# Patient Record
Sex: Female | Born: 1960 | Race: Black or African American | Hispanic: No | Marital: Married | Smoking: Never smoker
Health system: Southern US, Community
[De-identification: ages and names within clinical notes are randomized; demographics above are authoritative.]

## PROBLEM LIST (undated history)

## (undated) DIAGNOSIS — E042 Nontoxic multinodular goiter: Secondary | ICD-10-CM

## (undated) DIAGNOSIS — M1990 Unspecified osteoarthritis, unspecified site: Secondary | ICD-10-CM

## (undated) DIAGNOSIS — E669 Obesity, unspecified: Secondary | ICD-10-CM

## (undated) DIAGNOSIS — M1710 Unilateral primary osteoarthritis, unspecified knee: Secondary | ICD-10-CM

## (undated) DIAGNOSIS — R0683 Snoring: Secondary | ICD-10-CM

## (undated) DIAGNOSIS — G5603 Carpal tunnel syndrome, bilateral upper limbs: Secondary | ICD-10-CM

## (undated) DIAGNOSIS — G629 Polyneuropathy, unspecified: Secondary | ICD-10-CM

## (undated) DIAGNOSIS — E039 Hypothyroidism, unspecified: Secondary | ICD-10-CM

## (undated) DIAGNOSIS — R232 Flushing: Secondary | ICD-10-CM

## (undated) DIAGNOSIS — S83249A Other tear of medial meniscus, current injury, unspecified knee, initial encounter: Secondary | ICD-10-CM

## (undated) DIAGNOSIS — R809 Proteinuria, unspecified: Secondary | ICD-10-CM

## (undated) DIAGNOSIS — E119 Type 2 diabetes mellitus without complications: Secondary | ICD-10-CM

## (undated) DIAGNOSIS — M67431 Ganglion, right wrist: Secondary | ICD-10-CM

## (undated) DIAGNOSIS — I10 Essential (primary) hypertension: Secondary | ICD-10-CM

## (undated) DIAGNOSIS — E1340 Other specified diabetes mellitus with diabetic neuropathy, unspecified: Secondary | ICD-10-CM

## (undated) DIAGNOSIS — E785 Hyperlipidemia, unspecified: Secondary | ICD-10-CM

## (undated) HISTORY — DX: Proteinuria, unspecified: R80.9

## (undated) HISTORY — DX: Hyperlipidemia, unspecified: E78.5

## (undated) HISTORY — DX: Essential (primary) hypertension: I10

## (undated) HISTORY — DX: Flushing: R23.2

## (undated) HISTORY — DX: Unilateral primary osteoarthritis, unspecified knee: M17.10

## (undated) HISTORY — DX: Obesity, unspecified: E66.9

## (undated) HISTORY — DX: Polyneuropathy, unspecified: G62.9

## (undated) HISTORY — PX: BREAST EXCISIONAL BIOPSY: SUR124

## (undated) HISTORY — DX: Other tear of medial meniscus, current injury, unspecified knee, initial encounter: S83.249A

## (undated) HISTORY — PX: ABDOMINAL HYSTERECTOMY: SHX81

## (undated) HISTORY — DX: Ganglion, right wrist: M67.431

## (undated) HISTORY — DX: Snoring: R06.83

## (undated) HISTORY — DX: Carpal tunnel syndrome, bilateral upper limbs: G56.03

## (undated) HISTORY — DX: Nontoxic multinodular goiter: E04.2

---
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
## 2004-10-21 ENCOUNTER — Ambulatory Visit: Payer: Self-pay | Admitting: Family Medicine

## 2005-08-09 ENCOUNTER — Ambulatory Visit: Payer: Self-pay | Admitting: Family Medicine

## 2005-08-19 ENCOUNTER — Ambulatory Visit: Payer: Self-pay | Admitting: Family Medicine

## 2006-08-08 ENCOUNTER — Ambulatory Visit: Payer: Self-pay

## 2007-01-17 ENCOUNTER — Other Ambulatory Visit: Payer: Self-pay

## 2007-01-23 ENCOUNTER — Ambulatory Visit: Payer: Self-pay | Admitting: Unknown Physician Specialty

## 2008-06-23 ENCOUNTER — Ambulatory Visit: Payer: Self-pay | Admitting: Unknown Physician Specialty

## 2008-06-23 ENCOUNTER — Other Ambulatory Visit: Payer: Self-pay

## 2008-07-10 ENCOUNTER — Ambulatory Visit: Payer: Self-pay | Admitting: Unknown Physician Specialty

## 2009-03-03 ENCOUNTER — Ambulatory Visit: Payer: Self-pay | Admitting: Family Medicine

## 2009-05-12 LAB — HM PAP SMEAR: HM Pap smear: NORMAL

## 2009-12-02 ENCOUNTER — Emergency Department: Payer: Self-pay | Admitting: Emergency Medicine

## 2010-03-04 ENCOUNTER — Ambulatory Visit: Payer: Self-pay | Admitting: Family Medicine

## 2011-03-08 ENCOUNTER — Ambulatory Visit: Payer: Self-pay | Admitting: Family Medicine

## 2011-06-08 ENCOUNTER — Other Ambulatory Visit: Payer: Self-pay | Admitting: Family Medicine

## 2012-03-22 ENCOUNTER — Ambulatory Visit: Payer: Self-pay | Admitting: Family Medicine

## 2012-05-08 LAB — HM COLONOSCOPY: HM Colonoscopy: NORMAL

## 2013-04-30 ENCOUNTER — Ambulatory Visit: Payer: Self-pay | Admitting: Family Medicine

## 2013-10-01 HISTORY — PX: FINE NEEDLE ASPIRATION: SHX6590

## 2014-05-20 ENCOUNTER — Ambulatory Visit: Payer: Self-pay | Admitting: Family Medicine

## 2014-05-20 LAB — HM MAMMOGRAPHY: HM Mammogram: NORMAL

## 2014-08-12 LAB — LIPID PANEL
CHOLESTEROL: 144 mg/dL (ref 0–200)
HDL: 90 mg/dL — AB (ref 35–70)
LDL Cholesterol: 42 mg/dL
LDl/HDL Ratio: 0.5
TRIGLYCERIDES: 58 mg/dL (ref 40–160)

## 2015-02-12 LAB — HEMOGLOBIN A1C: Hgb A1c MFr Bld: 7.1 % — AB (ref 4.0–6.0)

## 2015-05-13 ENCOUNTER — Other Ambulatory Visit: Payer: Self-pay | Admitting: Family Medicine

## 2015-05-30 ENCOUNTER — Encounter: Payer: Self-pay | Admitting: Family Medicine

## 2015-05-30 DIAGNOSIS — E785 Hyperlipidemia, unspecified: Secondary | ICD-10-CM | POA: Insufficient documentation

## 2015-05-30 DIAGNOSIS — E890 Postprocedural hypothyroidism: Secondary | ICD-10-CM | POA: Insufficient documentation

## 2015-05-30 DIAGNOSIS — R809 Proteinuria, unspecified: Secondary | ICD-10-CM | POA: Insufficient documentation

## 2015-05-30 DIAGNOSIS — E1141 Type 2 diabetes mellitus with diabetic mononeuropathy: Secondary | ICD-10-CM | POA: Insufficient documentation

## 2015-05-30 DIAGNOSIS — G5600 Carpal tunnel syndrome, unspecified upper limb: Secondary | ICD-10-CM | POA: Insufficient documentation

## 2015-05-30 DIAGNOSIS — G629 Polyneuropathy, unspecified: Secondary | ICD-10-CM | POA: Insufficient documentation

## 2015-05-30 DIAGNOSIS — Z1211 Encounter for screening for malignant neoplasm of colon: Secondary | ICD-10-CM | POA: Insufficient documentation

## 2015-05-30 DIAGNOSIS — I10 Essential (primary) hypertension: Secondary | ICD-10-CM | POA: Insufficient documentation

## 2015-06-01 ENCOUNTER — Encounter: Payer: Self-pay | Admitting: Family Medicine

## 2015-06-01 ENCOUNTER — Ambulatory Visit (INDEPENDENT_AMBULATORY_CARE_PROVIDER_SITE_OTHER): Payer: BLUE CROSS/BLUE SHIELD | Admitting: Family Medicine

## 2015-06-01 VITALS — BP 126/66 | HR 68 | Temp 98.0°F | Resp 18 | Ht 66.5 in | Wt 234.6 lb

## 2015-06-01 DIAGNOSIS — I10 Essential (primary) hypertension: Secondary | ICD-10-CM

## 2015-06-01 DIAGNOSIS — G5600 Carpal tunnel syndrome, unspecified upper limb: Secondary | ICD-10-CM

## 2015-06-01 DIAGNOSIS — N189 Chronic kidney disease, unspecified: Secondary | ICD-10-CM | POA: Diagnosis not present

## 2015-06-01 DIAGNOSIS — R809 Proteinuria, unspecified: Secondary | ICD-10-CM | POA: Diagnosis not present

## 2015-06-01 DIAGNOSIS — E1122 Type 2 diabetes mellitus with diabetic chronic kidney disease: Secondary | ICD-10-CM | POA: Diagnosis not present

## 2015-06-01 DIAGNOSIS — E669 Obesity, unspecified: Secondary | ICD-10-CM

## 2015-06-01 DIAGNOSIS — E785 Hyperlipidemia, unspecified: Secondary | ICD-10-CM

## 2015-06-01 LAB — POCT GLYCOSYLATED HEMOGLOBIN (HGB A1C): Hemoglobin A1C: 6.2

## 2015-06-01 LAB — POCT UA - MICROALBUMIN: Microalbumin Ur, POC: 20 mg/L

## 2015-06-01 MED ORDER — IRBESARTAN-HYDROCHLOROTHIAZIDE 300-12.5 MG PO TABS: 1.0000 | ORAL_TABLET | Freq: Every day | ORAL | Status: DC

## 2015-06-01 MED ORDER — ATORVASTATIN CALCIUM 40 MG PO TABS: 40.0000 mg | ORAL_TABLET | Freq: Every day | ORAL | Status: DC

## 2015-06-01 MED ORDER — DULAGLUTIDE 1.5 MG/0.5ML ~~LOC~~ SOAJ: 1.5000 mg | SUBCUTANEOUS | Status: DC

## 2015-06-01 MED ORDER — GABAPENTIN 300 MG PO CAPS: 300.0000 mg | ORAL_CAPSULE | Freq: Every day | ORAL | Status: DC

## 2015-06-01 MED ORDER — METFORMIN HCL 1000 MG PO TABS: 1000.0000 mg | ORAL_TABLET | Freq: Every day | ORAL | Status: DC

## 2015-06-01 NOTE — Progress Notes (Signed)
Name: Samantha Copeland   MRN: [id number]    DOB: 1961/05/01   Date:06/01/2015       Progress Note  Subjective  Chief Complaint  Chief Complaint  Patient presents with  . Diabetes    1 month follow-up, weight loss 8lbs, sugar improving, but new shots has given her knots in her skin. Checks Sugar bid- Low- 83 Average 90's High-130    HPI  DMII: she started Bydureon three months ago, she has lost weight since the medication is curbing her appetite, she has also changed the way she eats.  Eating fish a couple of times weekly, smaller portions, more vegetables and has been working out with Wii games and has lost 8 lbs since last visit 2 months ago. She does not like the abdominal nodules caused by the injections.  Compliant the the medications and hgbA1C has gone down from 7.1 to 6.2 and is doing well. She has a history of albuminuria and also carpal tunnel disease but is doing well on neurontin and also ARB. Taking aspirin daily as instructed. She is due for an eye exam and will schedule it today.  HTN: taking medication daily and bp has been at goal, denies side effects of medication.   Hyperlipidemia: taking Atorvastatin and denies side effects. No chest pain, no myalgias.  Patient Active Problem List   Diagnosis Date Noted  . Benign essential HTN 05/30/2015  . Carpal tunnel syndrome 05/30/2015  . Dyslipidemia 05/30/2015  . 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 Multinodular goiter 05/30/2015  . Microalbuminuria 05/30/2015  . Obesity (BMI 30-39.9) 05/30/2015  . Diabetes mellitus with renal manifestation 05/30/2015    Past Surgical History  Procedure Laterality Date  . Abdominal hysterectomy    . Breast biopsy Right     benign  . 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 Fine needle aspiration  10/01/2013    Thyroid-Dr. Gabriel Carina    Family History  Problem Relation Age of Onset  . Diabetes Mother   . Diabetes Father    . Hypertension Father   . Hyperlipidemia Father   . Cancer Father     History   Social History  . Marital Status: Married    Spouse Name: N/A  . Number of Children: N/A  . Years of Education: N/A   Occupational History  . Not on file.   Social History Main Topics  . Smoking status: Never Smoker   . Smokeless tobacco: Not on file  . Alcohol Use: No  . Drug Use: No  . Sexual Activity:    Partners: Male   Other Topics Concern  . Not on file   Social History Narrative     Current outpatient prescriptions:  .  aspirin 81 MG tablet, , Disp: , Rfl:  .  atorvastatin (LIPITOR) 40 MG tablet, Take 1 tablet (40 mg total) by mouth daily., Disp: 30 tablet, Rfl: 5 .  gabapentin (NEURONTIN) 300 MG capsule, Take 1 capsule (300 mg total) by mouth daily., Disp: 30 capsule, Rfl: 5 .  glucose blood test strip, , Disp: , Rfl:  .  irbesartan-hydrochlorothiazide (AVALIDE) 300-12.5 MG per tablet, Take 1 tablet by mouth daily., Disp: 30 tablet, Rfl: 5 .  metFORMIN (GLUCOPHAGE) 1000 MG tablet, Take 1 tablet (1,000 mg total) by mouth daily., Disp: 30 tablet, Rfl: 5 .  Dulaglutide (TRULICITY) 1.5 GD/9.2EQ SOPN, Inject 1.5 mg into the skin once a week., Disp:  4 pen, Rfl: 5  No Known Allergies   ROS  Constitutional: Negative for fever or weight change.  Respiratory: Negative for cough and shortness of breath.   Cardiovascular: Negative for chest pain or palpitations.  Gastrointestinal: Negative for abdominal pain, no bowel changes.  Musculoskeletal: Negative for gait problem or joint swelling.  Skin: Negative for rash.  Neurological: Negative for dizziness or headache.  No other specific complaints in a complete review of systems (except as listed in HPI above).  Objective  Filed Vitals:   06/01/15 0829  BP: 126/66  Pulse: 68  Temp: 98 F (36.7 C)  TempSrc: Oral  Resp: 18  Height: 5' 6.5" (1.689 m)  Weight: 234 lb 9.6 oz (106.414 kg)  SpO2: 98%    Body mass index is 37.3  kg/(m^2).  Physical Exam  Constitutional: Patient appears well-developed and well-nourished. No distress.  HENT: Head: Normocephalic and atraumatic. Nose: Nose normal. Mouth/Throat: Oropharynx is clear and moist. No oropharyngeal exudate.  Eyes: Conjunctivae and EOM are normal. Pupils are equal, round, and reactive to light. No scleral icterus.  Neck: Normal range of motion. Neck supple. No JVD present. Thyromegaly - sees Dr. Gabriel Carina yearly  Cardiovascular: Normal rate, regular rhythm and normal heart sounds.  No murmur heard. Trace of pretibial edema Pulmonary/Chest: Effort normal and breath sounds normal. No respiratory distress. Abdominal: Soft.  There is no tenderness. no masses Musculoskeletal: Normal range of motion, no joint effusions. No gross deformities Neurological: he is alert and oriented to person, place, and time. No cranial nerve deficit. Coordination, balance, strength, speech and gait are normal.  Skin: Skin is warm and dry. No rash noted. No erythema.  Psychiatric: Patient has a normal mood and affect. behavior is normal. Judgment and thought content normal.  Recent Results (from the past 2160 hour(s))  POCT HgB A1C     Status: Abnormal   Collection Time: 06/01/15  8:37 AM  Result Value Ref Range   Hemoglobin A1C 6.2   POCT UA - Microalbumin     Status: Normal   Collection Time: 06/01/15  8:38 AM  Result Value Ref Range   Microalbumin Ur, POC 20 mg/L   Creatinine, POC  mg/dL   Albumin/Creatinine Ratio, Urine, POC      Diabetic Foot Exam: Diabetic Foot Exam - Simple   Simple Foot Form  Visual Inspection  No deformities, no ulcerations, no other skin breakdown bilaterally:  Yes  Sensation Testing  Intact to touch and monofilament testing bilaterally:  Yes  Pulse Check  Posterior Tibialis and Dorsalis pulse intact bilaterally:  Yes  Comments       PHQ2/9: Depression screen PHQ 2/9 06/01/2015  Decreased Interest 0  Down, Depressed, Hopeless 0  PHQ - 2 Score  0     Fall Risk: Fall Risk  06/01/2015  Falls in the past year? No     Assessment & Plan  1. Type 2 diabetes mellitus with diabetic chronic kidney disease Change to Trulicity to decrease side effects, doing well otherwise, continue the good work  - POCT HgB A1C - POCT UA - Microalbumin - Dulaglutide (TRULICITY) 1.5 ZO/1.0RU SOPN; Inject 1.5 mg into the skin once a week.  Dispense: 4 pen; Refill: 5 - metFORMIN (GLUCOPHAGE) 1000 MG tablet; Take 1 tablet (1,000 mg total) by mouth daily.  Dispense: 30 tablet; Refill: 5  2. Microalbuminuria Improved, down to 20, continue ARB and glucose control   3. Benign essential HTN At goal  - irbesartan-hydrochlorothiazide (AVALIDE) 300-12.5 MG per  tablet; Take 1 tablet by mouth daily.  Dispense: 30 tablet; Refill: 5 - Comprehensive Metabolic Panel (CMET)  4. Obesity (BMI 30-39.9) Losing weight with dietary modification and physical activity   5. Dyslipidemia Recheck labs - atorvastatin (LIPITOR) 40 MG tablet; Take 1 tablet (40 mg total) by mouth daily.  Dispense: 30 tablet; Refill: 5 - Lipid Profile  6. Carpal tunnel syndrome, unspecified laterality Doing well  - gabapentin (NEURONTIN) 300 MG capsule; Take 1 capsule (300 mg total) by mouth daily.  Dispense: 30 capsule; Refill: 5

## 2015-06-01 NOTE — Patient Instructions (Signed)
Dulaglutide injection What is this medicine? DULAGLUTIDE (DOO la GLOO tide) is used to improve blood sugar control in adults with type 2 diabetes. This medicine may be used with other oral diabetes medicines. This medicine may be used for other purposes; ask your health care provider or pharmacist if you have questions. COMMON BRAND NAME(S): TRULICITY What should I tell my health care provider before I take this medicine? They need to know if you have any of these conditions: -endocrine tumors (MEN 2) or if someone in your family had these tumors -history of pancreatitis -kidney disease -liver disease -stomach problems -thyroid cancer or if someone in your family had thyroid cancer -an unusual or allergic reaction to dulaglutide, other medicines, foods, dyes, or preservatives -pregnant or trying to get pregnant -breast-feeding How should I use this medicine? This medicine is for injection under the skin of your upper leg (thigh), stomach area, or upper arm. It is usually given once every week (every 7 days). You will be taught how to prepare and give this medicine. Use exactly as directed. Take your medicine at regular intervals. Do not take it more often than directed. If you use this medicine with insulin, you should inject this medicine and the insulin separately. Do not mix them together. Do not give the injections right next to each other. Change (rotate) injection sites with each injection. It is important that you put your used needles and syringes in a special sharps container. Do not put them in a trash can. If you do not have a sharps container, call your pharmacist or healthcare provider to get one. A special MedGuide will be given to you by the pharmacist with each prescription and refill. Be sure to read this information carefully each time. Talk to your pediatrician regarding the use of this medicine in children. Special care may be needed. Overdosage: If you think you've taken  too much of this medicine contact a poison control center or emergency room at once. Overdosage: If you think you have taken too much of this medicine contact a poison control center or emergency room at once. NOTE: This medicine is only for you. Do not share this medicine with others. What if I miss a dose? If you miss a dose, take it as soon as you can within 3 days after the missed dose. Then take your next dose at your regular weekly time. If it has been longer than 3 days after the missed dose, do not take the missed dose. Take the next dose at your regular time. Do not take double or extra doses. If you have questions about a missed dose, contact your health care provider for advice. What may interact with this medicine? Do not take this medicine with any of the following medications: -gatifloxacin Many medications may cause changes in blood sugar, these include: -alcohol containing beverages -aspirin and aspirin-like drugs -chloramphenicol -chromium -diuretics -female hormones, such as estrogens or progestins, birth control pills -heart medicines -isoniazid -female hormones or anabolic steroids -medications for weight loss -medicines for allergies, asthma, cold, or cough -medicines for mental problems -medicines called MAO inhibitors - Nardil, Parnate, Marplan, Eldepryl -niacin -NSAIDS, such as ibuprofen -pentamidine -phenytoin -probenecid -quinolone antibiotics such as ciprofloxacin, levofloxacin, ofloxacin -some herbal dietary supplements -steroid medicines such as prednisone or cortisone -thyroid hormonesSome medications can hide the warning symptoms of low blood sugar (hypoglycemia). You may need to monitor your blood sugar more closely if you are taking one of these medications. These include: -beta-blockers, often  used for high blood pressure or heart problems (examples include atenolol, metoprolol, propranolol) -clonidine -guanethidine -reserpine This list may not  describe all possible interactions. Give your health care provider a list of all the medicines, herbs, non-prescription drugs, or dietary supplements you use. Also tell them if you smoke, drink alcohol, or use illegal drugs. Some items may interact with your medicine. What should I watch for while using this medicine? Visit your doctor or health care professional for regular checks on your progress. A test called the HbA1C (A1C) will be monitored. This is a simple blood test. It measures your blood sugar control over the last 2 to 3 months. You will receive this test every 3 to 6 months. Learn how to check your blood sugar. Learn the symptoms of low and high blood sugar and how to manage them. Always carry a quick-source of sugar with you in case you have symptoms of low blood sugar. Examples include hard sugar candy or glucose tablets. Make sure others know that you can choke if you eat or drink when you develop serious symptoms of low blood sugar, such as seizures or unconsciousness. They must get medical help at once. Tell your doctor or health care professional if you have high blood sugar. You might need to change the dose of your medicine. If you are sick or exercising more than usual, you might need to change the dose of your medicine. Do not skip meals. Ask your doctor or health care professional if you should avoid alcohol. Many nonprescription cough and cold products contain sugar or alcohol. These can affect blood sugar. Wear a medical ID bracelet or chain, and carry a card that describes your disease and details of your medicine and dosage times. What side effects may I notice from receiving this medicine? Side effects that you should report to your doctor or health care professional as soon as possible: -allergic reactions like skin rash, itching or hives, swelling of the face, lips, or tongue -breathing problems -signs and symptoms of low blood sugar such as feeling anxious, confusion,  dizziness, increased hunger, unusually weak or tired, sweating, shakiness, cold, irritable, headache, blurred vision, fast heartbeat, loss of consciousness -unusual stomach upset or pain -vomiting Side effects that usually do not require medical attention (Report these to your doctor or health care professional if they continue or are bothersome.):diarrhea -heartburn -loss of appetite -nausea -pain, redness, or irritation at site where injected This list may not describe all possible side effects. Call your doctor for medical advice about side effects. You may report side effects to FDA at 1-800-FDA-1088. Where should I keep my medicine? Keep out of the reach of children. Store this medicine in a refrigerator between 2 and 8 degrees C (36 and 46 degrees F). Do not freeze or use if the medicine has been frozen. Protect from light and excessive heat. Each single-dose pen or prefilled syringe can be kept at room temperature, not to exceed 30 degrees C (86 degrees F) for a total of 14 days, if needed. Store in the carton until use. Throw away any unused medicine after the expiration date. NOTE: This sheet is a summary. It may not cover all possible information. If you have questions about this medicine, talk to your doctor, pharmacist, or health care provider.  2015, [employer]. (2013-10-01 13:53:28)

## 2015-06-05 ENCOUNTER — Telehealth: Payer: Self-pay

## 2015-06-05 LAB — COMPREHENSIVE METABOLIC PANEL
ALT: 16 IU/L (ref 0–32)
AST: 20 IU/L (ref 0–40)
Albumin/Globulin Ratio: 1.2 (ref 1.1–2.5)
Albumin: 4.1 g/dL (ref 3.5–5.5)
Alkaline Phosphatase: 86 IU/L (ref 39–117)
BILIRUBIN TOTAL: 0.5 mg/dL (ref 0.0–1.2)
BUN / CREAT RATIO: 10 (ref 9–23)
BUN: 11 mg/dL (ref 6–24)
CHLORIDE: 99 mmol/L (ref 97–108)
CO2: 25 mmol/L (ref 18–29)
Calcium: 9.4 mg/dL (ref 8.7–10.2)
Creatinine, Ser: 1.06 mg/dL — ABNORMAL HIGH (ref 0.57–1.00)
GFR calc non Af Amer: 60 mL/min/{1.73_m2} (ref >59)
GFR, EST AFRICAN AMERICAN: 69 mL/min/{1.73_m2} (ref >59)
Globulin, Total: 3.3 g/dL (ref 1.5–4.5)
Glucose: 77 mg/dL (ref 65–99)
POTASSIUM: 3.9 mmol/L (ref 3.5–5.2)
Sodium: 141 mmol/L (ref 134–144)
Total Protein: 7.4 g/dL (ref 6.0–8.5)

## 2015-06-05 LAB — LIPID PANEL
CHOL/HDL RATIO: 1.5 ratio (ref 0.0–4.4)
Cholesterol, Total: 139 mg/dL (ref 100–199)
HDL: 94 mg/dL (ref >39)
LDL Calculated: 35 mg/dL (ref 0–99)
Triglycerides: 52 mg/dL (ref 0–149)
VLDL CHOLESTEROL CAL: 10 mg/dL (ref 5–40)

## 2015-06-05 NOTE — Telephone Encounter (Signed)
-----   Message from Steele Sizer, MD sent at 06/05/2015  8:29 AM EDT ----- Continue high dose statin ( Atorvastatin) lipid at goal GFR stage III CKI, glucose and liver enzymes within normal limits Please notify patient, thank you

## 2015-06-05 NOTE — Telephone Encounter (Signed)
Patient notified of lab results by phone.  

## 2015-08-16 ENCOUNTER — Other Ambulatory Visit: Payer: Self-pay | Admitting: Family Medicine

## 2015-09-02 ENCOUNTER — Ambulatory Visit: Payer: BLUE CROSS/BLUE SHIELD | Admitting: Family Medicine

## 2015-09-02 ENCOUNTER — Encounter: Payer: Self-pay | Admitting: Family Medicine

## 2015-09-02 ENCOUNTER — Ambulatory Visit (INDEPENDENT_AMBULATORY_CARE_PROVIDER_SITE_OTHER): Payer: BLUE CROSS/BLUE SHIELD | Admitting: Family Medicine

## 2015-09-02 VITALS — BP 114/68 | HR 92 | Temp 97.3°F | Resp 16 | Ht 67.0 in | Wt 225.4 lb

## 2015-09-02 DIAGNOSIS — N189 Chronic kidney disease, unspecified: Secondary | ICD-10-CM | POA: Diagnosis not present

## 2015-09-02 DIAGNOSIS — M5410 Radiculopathy, site unspecified: Secondary | ICD-10-CM | POA: Diagnosis not present

## 2015-09-02 DIAGNOSIS — R809 Proteinuria, unspecified: Secondary | ICD-10-CM | POA: Diagnosis not present

## 2015-09-02 DIAGNOSIS — E1121 Type 2 diabetes mellitus with diabetic nephropathy: Secondary | ICD-10-CM

## 2015-09-02 DIAGNOSIS — E785 Hyperlipidemia, unspecified: Secondary | ICD-10-CM | POA: Diagnosis not present

## 2015-09-02 DIAGNOSIS — E1149 Type 2 diabetes mellitus with other diabetic neurological complication: Secondary | ICD-10-CM | POA: Diagnosis not present

## 2015-09-02 DIAGNOSIS — E669 Obesity, unspecified: Secondary | ICD-10-CM

## 2015-09-02 DIAGNOSIS — I10 Essential (primary) hypertension: Secondary | ICD-10-CM | POA: Diagnosis not present

## 2015-09-02 DIAGNOSIS — E1122 Type 2 diabetes mellitus with diabetic chronic kidney disease: Secondary | ICD-10-CM | POA: Diagnosis not present

## 2015-09-02 DIAGNOSIS — G5600 Carpal tunnel syndrome, unspecified upper limb: Secondary | ICD-10-CM

## 2015-09-02 DIAGNOSIS — Z23 Encounter for immunization: Secondary | ICD-10-CM

## 2015-09-02 LAB — POCT GLYCOSYLATED HEMOGLOBIN (HGB A1C): Hemoglobin A1C: 6.1

## 2015-09-02 MED ORDER — IRBESARTAN-HYDROCHLOROTHIAZIDE 300-12.5 MG PO TABS: 1.0000 | ORAL_TABLET | Freq: Every day | ORAL | Status: DC

## 2015-09-02 MED ORDER — SIMVASTATIN 20 MG PO TABS: 20.0000 mg | ORAL_TABLET | Freq: Every day | ORAL | Status: DC

## 2015-09-02 MED ORDER — GABAPENTIN 300 MG PO CAPS: 300.0000 mg | ORAL_CAPSULE | Freq: Every day | ORAL | Status: DC

## 2015-09-02 MED ORDER — METFORMIN HCL 500 MG PO TABS: 500.0000 mg | ORAL_TABLET | Freq: Every day | ORAL | Status: DC

## 2015-09-02 NOTE — Progress Notes (Signed)
Name: Samantha Copeland   MRN: [id number]    DOB: 1961-03-10   Date:09/02/2015       Progress Note  Subjective  Chief Complaint  Chief Complaint  Patient presents with  . Medication Refill    follow-up  . Diabetes    Checks BG 2x day low-80, avg -96-100 high-120  . Hypertension  . Hyperlipidemia  . Flank Pain    left side onset 3xday    HPI   DMII with nephropathy and carpal tunnel syndrome: she is doing very well on Trulicity and Metformin, hgbA1C is down to 6.1. Symptoms of carpal tunnel have improve, still taking Gabapentin before bed.  We discussed reducing dose of Metformin to avoid hypoglycemia.  Continue the weight loss and life style modification. Eating fish twice weekly, cutting down on peanuts. Explained tree nuts are healthier  HTN: bp is at goal, denies side effects of medications. Compliant with medication   Hyperlipidemia: taking Simvastatin, denies side effects of medications, labs done in June and reviewed today  Muscle pain: woke up with some spasms on left flank that lasted all day, but resolved with Bengay and massage . No urinary symptoms, no rashes. Feeling well now   Obesity: lost 10 lbs since last visit, changed diet and has been on Trulicity since June   Patient Active Problem List   Diagnosis Date Noted  . Benign essential HTN 05/30/2015  . Carpal tunnel syndrome 05/30/2015  . Dyslipidemia 05/30/2015  . 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 Multinodular goiter 05/30/2015  . Microalbuminuria 05/30/2015  . Obesity (BMI 30-39.9) 05/30/2015  . Diabetes mellitus with renal manifestation 05/30/2015    Past Surgical History  Procedure Laterality Date  . Abdominal hysterectomy    . Breast biopsy Right     benign  . 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 Fine needle aspiration  10/01/2013    Thyroid-Dr. Gabriel Carina    Family History  Problem Relation Age of Onset  . Diabetes Mother   .  Diabetes Father   . Hypertension Father   . Hyperlipidemia Father   . Cancer Father     Social History   Social History  . Marital Status: Married    Spouse Name: N/A  . Number of Children: N/A  . Years of Education: N/A   Occupational History  . Not on file.   Social History Main Topics  . Smoking status: Never Smoker   . Smokeless tobacco: Not on file  . Alcohol Use: No  . Drug Use: No  . Sexual Activity:    Partners: Male   Other Topics Concern  . Not on file   Social History Narrative     Current outpatient prescriptions:  .  aspirin 81 MG tablet, , Disp: , Rfl:  .  Dulaglutide (TRULICITY) 1.5 ZG/0.1VC SOPN, Inject 1.5 mg into the skin once a week., Disp: 4 pen, Rfl: 5 .  gabapentin (NEURONTIN) 300 MG capsule, Take 1 capsule (300 mg total) by mouth at bedtime., Disp: 90 capsule, Rfl: 1 .  glucose blood test strip, , Disp: , Rfl:  .  irbesartan-hydrochlorothiazide (AVALIDE) 300-12.5 MG per tablet, Take 1 tablet by mouth daily., Disp: 90 tablet, Rfl: 1 .  metFORMIN (GLUCOPHAGE) 500 MG tablet, Take 1 tablet (500 mg total) by mouth daily., Disp: 90 tablet, Rfl: 1 .  simvastatin (ZOCOR) 20 MG tablet, Take 1 tablet (20 mg total) by mouth daily  at 6 PM., Disp: 90 tablet, Rfl: 1  No Known Allergies   ROS  Constitutional: Negative for fever, positive for weight change - loss.  Respiratory: Negative for cough and shortness of breath.   Cardiovascular: Negative for chest pain or palpitations.  Gastrointestinal: Negative for abdominal pain, no bowel changes.  Musculoskeletal: Negative for gait problem or joint swelling.  Skin: Negative for rash.  Neurological: Negative for dizziness or headache.  No other specific complaints in a complete review of systems (except as listed in HPI above).  Objective  Filed Vitals:   09/02/15 1101  BP: 114/68  Pulse: 92  Temp: 97.3 F (36.3 C)  TempSrc: Oral  Resp: 16  Height: 5\' 7"  (1.702 m)  Weight: 225 lb 6.4 oz (102.241  kg)  SpO2: 97%    Body mass index is 35.29 kg/(m^2).  Physical Exam  Constitutional: Patient appears well-developed . Obese No distress.  HEENT: head atraumatic, normocephalic, pupils equal and reactive to light,  neck supple, throat within normal limits Cardiovascular: Normal rate, regular rhythm and normal heart sounds.  No murmur heard. No BLE edema. Pulmonary/Chest: Effort normal and breath sounds normal. No respiratory distress. Abdominal: Soft.  There is no tenderness. Psychiatric: Patient has a normal mood and affect. behavior is normal. Judgment and thought content normal.  Recent Results (from the past 2160 hour(s))  POCT HgB A1C     Status: None   Collection Time: 09/02/15 11:06 AM  Result Value Ref Range   Hemoglobin A1C 6.1      PHQ2/9: Depression screen [facility] 2/9 09/02/2015 06/01/2015  Decreased Interest 0 0  Down, Depressed, Hopeless 0 0  PHQ - 2 Score 0 0    Fall Risk: Fall Risk  09/02/2015 06/01/2015  Falls in the past year? No No      Functional Status Survey: Is the patient deaf or have difficulty hearing?: No Does the patient have difficulty seeing, even when wearing glasses/contacts?: Yes (Glasses) Does the patient have difficulty concentrating, remembering, or making decisions?: No Does the patient have difficulty walking or climbing stairs?: No Does the patient have difficulty dressing or bathing?: No Does the patient have difficulty doing errands alone such as visiting a doctor's office or shopping?: No    Assessment & Plan  1. Type 2 diabetes mellitus with diabetic nephropathy  She is doing great, we will decrease dose of Metformin, continue Trulicity and life style modification   2. Needs flu shot  - Flu Vaccine QUAD 36+ mos PF IM (Fluarix & Fluzone Quad PF)  3. Diabetic radiculopathy  - POCT HgB A1C Continue Gabapentin  4. Benign essential HTN  - irbesartan-hydrochlorothiazide (AVALIDE) 300-12.5 MG per tablet; Take 1 tablet by mouth  daily.  Dispense: 90 tablet; Refill: 1  5. Microalbuminuria  Last value was normal , doing well on ARB  6. Dyslipidemia  - simvastatin (ZOCOR) 20 MG tablet; Take 1 tablet (20 mg total) by mouth daily at 6 PM.  Dispense: 90 tablet; Refill: 1  7. Obesity (BMI 30-39.9) Doing great lost 10 lbs since last visit   8. Type 2 diabetes mellitus with diabetic chronic kidney disease  - metFORMIN (GLUCOPHAGE) 500 MG tablet; Take 1 tablet (500 mg total) by mouth daily.  Dispense: 90 tablet; Refill: 1  9. Carpal tunnel syndrome, unspecified laterality  - gabapentin (NEURONTIN) 300 MG capsule; Take 1 capsule (300 mg total) by mouth at bedtime.  Dispense: 90 capsule; Refill: 1

## 2015-09-22 ENCOUNTER — Telehealth: Payer: Self-pay | Admitting: Family Medicine

## 2015-09-22 NOTE — Telephone Encounter (Signed)
Pt notified it will kbe ok to skip one dose.

## 2015-09-22 NOTE — Telephone Encounter (Signed)
Patient messed up one of her trulicity needles and would like to know if it is okay for her to miss a dose because they only gave her enough for 1 month and she only have one left for next week.

## 2015-11-23 ENCOUNTER — Other Ambulatory Visit: Payer: Self-pay | Admitting: Family Medicine

## 2015-11-23 NOTE — Telephone Encounter (Signed)
Patient requesting refill. 

## 2015-12-02 ENCOUNTER — Inpatient Hospital Stay: Admission: RE | Admit: 2015-12-02 | Payer: Self-pay | Source: Ambulatory Visit

## 2015-12-03 ENCOUNTER — Encounter: Payer: Self-pay | Admitting: Family Medicine

## 2015-12-03 ENCOUNTER — Ambulatory Visit (INDEPENDENT_AMBULATORY_CARE_PROVIDER_SITE_OTHER): Payer: BLUE CROSS/BLUE SHIELD | Admitting: Family Medicine

## 2015-12-03 VITALS — BP 116/67 | HR 73 | Temp 97.8°F | Resp 18 | Wt 228.6 lb

## 2015-12-03 DIAGNOSIS — E785 Hyperlipidemia, unspecified: Secondary | ICD-10-CM

## 2015-12-03 DIAGNOSIS — I10 Essential (primary) hypertension: Secondary | ICD-10-CM | POA: Diagnosis not present

## 2015-12-03 DIAGNOSIS — E1121 Type 2 diabetes mellitus with diabetic nephropathy: Secondary | ICD-10-CM | POA: Diagnosis not present

## 2015-12-03 DIAGNOSIS — E042 Nontoxic multinodular goiter: Secondary | ICD-10-CM | POA: Diagnosis not present

## 2015-12-03 DIAGNOSIS — Z23 Encounter for immunization: Secondary | ICD-10-CM

## 2015-12-03 LAB — POCT UA - MICROALBUMIN: Microalbumin Ur, POC: 20 mg/L

## 2015-12-03 LAB — POCT GLYCOSYLATED HEMOGLOBIN (HGB A1C): Hemoglobin A1C: 5.9

## 2015-12-03 MED ORDER — DULAGLUTIDE 1.5 MG/0.5ML ~~LOC~~ SOAJ: SUBCUTANEOUS | Status: DC

## 2015-12-03 NOTE — Progress Notes (Signed)
Name: Samantha Copeland   MRN: [id number]    DOB: 05/30/1961   Date:12/03/2015       Progress Note  Subjective  Chief Complaint  Chief Complaint  Patient presents with  . Medication Refill    follow-up  . Diabetes  . Hypertension  . 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 Symptoms of carpal tunnel have improved and stable with gabapentin.  We will stop Metformin now.  Continue life style modification. Eating fish twice weekly. Explained tree nuts are healthier . Urine micro is 20 today and she is on ARB  HTN: bp is at goal, denies side effects of medications. Compliant with medication . No chest pain or SOB or palpitation   Hyperlipidemia: taking Simvastatin, denies side effects of medications, labs done in June and reviewed today. Discussed ways to increase HDL   Obesity: changed diet and has been on Trulicity since June, she has gained a few lbs since last visit, but states after Christmas she will resume exercising, working long hours.  Goiter: seeing Dr. Richardson Landry and is scheduled for total thyroidectomy next week  Patient Active Problem List   Diagnosis Date Noted  . Benign essential HTN 05/30/2015  . Carpal tunnel syndrome 05/30/2015  . Dyslipidemia 05/30/2015  . 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 Multinodular goiter 05/30/2015  . Microalbuminuria 05/30/2015  . Obesity (BMI 30-39.9) 05/30/2015  . Diabetes mellitus with renal manifestation ([place]) 05/30/2015    Past Surgical History  Procedure Laterality Date  . Abdominal hysterectomy    . Breast biopsy Right     benign  . 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 Fine needle aspiration  10/01/2013    Thyroid-Dr. Gabriel Carina    Family History  Problem Relation Age of Onset  . Diabetes Mother   . Diabetes  Father   . Hypertension Father   . Hyperlipidemia Father   . Cancer Father     Social History   Social History  . Marital Status: Married    Spouse Name: N/A  . Number of Children: N/A  . Years of Education: N/A   Occupational History  . Not on file.   Social History Main Topics  . Smoking status: Never Smoker   . Smokeless tobacco: Not on file  . Alcohol Use: No  . Drug Use: No  . Sexual Activity:    Partners: Male   Other Topics Concern  . Not on file   Social History Narrative     Current outpatient prescriptions:  .  aspirin 81 MG tablet, , Disp: , Rfl:  .  Dulaglutide (TRULICITY) 1.5 0000000 SOPN, INJECT 1.5MG  SUBCUTANEOUSLY ONCE A WEEK, Disp: 4 pen, Rfl: 2 .  gabapentin (NEURONTIN) 300 MG capsule, Take 1 capsule (300 mg total) by mouth at bedtime., Disp: 90 capsule, Rfl: 1 .  glucose blood test strip, , Disp: , Rfl:  .  irbesartan-hydrochlorothiazide (AVALIDE) 300-12.5 MG per tablet, Take 1 tablet by mouth daily., Disp: 90 tablet, Rfl: 1 .  simvastatin (ZOCOR) 20 MG tablet, Take 1 tablet (20 mg total) by mouth daily at 6 PM., Disp: 90 tablet, Rfl: 1  No Known Allergies   ROS  Constitutional: Negative for fever or significant  weight change.  Respiratory: Negative for cough and shortness of breath.  Cardiovascular: Negative for chest pain or palpitations.  Gastrointestinal: Negative for abdominal pain, no bowel changes.  Musculoskeletal: Negative for gait problem or joint swelling.  Skin: Negative for rash.  Neurological: Negative for dizziness or headache.  No other specific complaints in a complete review of systems (except as listed in HPI above).  Objective  Filed Vitals:   12/03/15 0813  BP: 116/67  Pulse: 73  Temp: 97.8 F (36.6 C)  TempSrc: Oral  Resp: 18  Weight: 228 lb 9.6 oz (103.692 kg)  SpO2: 96%    Body mass index is 35.8 kg/(m^2).  Physical Exam  Constitutional: Patient appears well-developed and well-nourished. Obese  No  distress.  HEENT: head atraumatic, normocephalic, pupils equal and reactive to light, neck supple, large goiter , throat within normal limits Cardiovascular: Normal rate, regular rhythm and normal heart sounds.  No murmur heard. No BLE edema. Pulmonary/Chest: Effort normal and breath sounds normal. No respiratory distress. Abdominal: Soft.  There is no tenderness. Psychiatric: Patient has a normal mood and affect. behavior is normal. Judgment and thought content normal.  Recent Results (from the past 2160 hour(s))  POCT HgB A1C     Status: Normal   Collection Time: 12/03/15  8:15 AM  Result Value Ref Range   Hemoglobin A1C 5.9   POCT UA - Microalbumin     Status: Abnormal   Collection Time: 12/03/15  8:16 AM  Result Value Ref Range   Microalbumin Ur, POC 20 mg/L   Creatinine, POC  mg/dL   Albumin/Creatinine Ratio, Urine, POC     PHQ2/9: Depression screen [facility] 2/9 12/03/2015 09/02/2015 06/01/2015  Decreased Interest 0 0 0  Down, Depressed, Hopeless 0 0 0  PHQ - 2 Score 0 0 0    Fall Risk: Fall Risk  12/03/2015 09/02/2015 06/01/2015  Falls in the past year? No No No    Functional Status Survey: Is the patient deaf or have difficulty hearing?: No Does the patient have difficulty seeing, even when wearing glasses/contacts?: Yes (glasses) Does the patient have difficulty concentrating, remembering, or making decisions?: No Does the patient have difficulty walking or climbing stairs?: No Does the patient have difficulty dressing or bathing?: No Does the patient have difficulty doing errands alone such as visiting a doctor's office or shopping?: No    Assessment & Plan  1. Diabetic nephropathy associated with type 2 diabetes mellitus ([place])  Stop Metformin, and continue to monitor glucose -  Currently 80's-90's - POCT HgB A1C - POCT UA - Microalbumin - Dulaglutide (TRULICITY) 1.5 0000000 SOPN; INJECT 1.5MG  SUBCUTANEOUSLY ONCE A WEEK  Dispense: 4 pen; Refill: 2  2.  Dyslipidemia  Continue medication   3. Benign essential HTN  Doing well on medication, denies orthostatic changes  4. Multinodular goiter  Going for surgery next week  5. Need for pneumococcal vaccination  - Pneumococcal conjugate vaccine 13-valent IM

## 2015-12-04 ENCOUNTER — Telehealth: Payer: Self-pay | Admitting: Family Medicine

## 2015-12-04 ENCOUNTER — Encounter
Admission: RE | Admit: 2015-12-04 | Discharge: 2015-12-04 | Disposition: A | Discharge status: Home / Self Care | Payer: BLUE CROSS/BLUE SHIELD | Source: Ambulatory Visit | Attending: Otolaryngology | Admitting: Otolaryngology

## 2015-12-04 DIAGNOSIS — Z01812 Encounter for preprocedural laboratory examination: Secondary | ICD-10-CM | POA: Diagnosis present

## 2015-12-04 DIAGNOSIS — Z01810 Encounter for preprocedural cardiovascular examination: Secondary | ICD-10-CM | POA: Diagnosis present

## 2015-12-04 HISTORY — DX: Type 2 diabetes mellitus without complications: E11.9

## 2015-12-04 HISTORY — DX: Unspecified osteoarthritis, unspecified site: M19.90

## 2015-12-04 HISTORY — DX: Other specified diabetes mellitus with diabetic neuropathy, unspecified: E13.40

## 2015-12-04 LAB — BASIC METABOLIC PANEL
ANION GAP: 7 (ref 5–15)
BUN: 16 mg/dL (ref 6–20)
CALCIUM: 9.2 mg/dL (ref 8.9–10.3)
CHLORIDE: 101 mmol/L (ref 101–111)
CO2: 28 mmol/L (ref 22–32)
CREATININE: 1.01 mg/dL — AB (ref 0.44–1.00)
GFR calc non Af Amer: >60 mL/min (ref >60)
GLUCOSE: 84 mg/dL (ref 65–99)
Potassium: 3.2 mmol/L — ABNORMAL LOW (ref 3.5–5.1)
Sodium: 136 mmol/L (ref 135–145)

## 2015-12-04 NOTE — Pre-Procedure Instructions (Signed)
Cardiac clearance received by Dr. Clayborn Bigness, and patient is cleared for surgery at low risk

## 2015-12-04 NOTE — Patient Instructions (Signed)
  Your procedure is scheduled on: December 09, 2015 (Wednesday) Report to Day Surgery.[facility]) Second Floor To find out your arrival time please call [phone] between 1PM - 3PM on December 08, 2015 (Tuesday).  Remember: Instructions that are not followed completely may result in serious medical risk, up to and including death, or upon the discretion of your surgeon and anesthesiologist your surgery may need to be rescheduled.    __x__ 1. Do not eat food or drink liquids after midnight. No gum chewing or hard candies.     ____ 2. No Alcohol for 24 hours before or after surgery.   ____ 3. Bring all medications with you on the day of surgery if instructed.    __x__ 4. Notify your doctor if there is any change in your medical condition     (cold, fever, infections).     Do not wear jewelry, make-up, hairpins, clips or nail polish.  Do not wear lotions, powders, or perfumes. You may wear deodorant.  Do not shave 48 hours prior to surgery. Men may shave face and neck.  Do not bring valuables to the hospital.    [facility] is not responsible for any belongings or valuables.               Contacts, dentures or bridgework may not be worn into surgery.  Leave your suitcase in the car. After surgery it may be brought to your room.  For patients admitted to the hospital, discharge time is determined by your                treatment team.   Patients discharged the day of surgery will not be allowed to drive home.   Please read over the following fact sheets that you were given:   Surgical Site Infection Prevention   ____ Take these medicines the morning of surgery with A SIP OF WATER:    1.   2.   3.   4.  5.  6.  ____ Fleet Enema (as directed)   _x___ Use CHG Soap as directed  ____ Use inhalers on the day of surgery  _x___ Stop metformin 2 days prior to surgery (STOP METFORMIN ON December 26)   ____ Take 1/2 of usual insulin dose the night before surgery and none on  the morning of surgery.   _x___ Stop Coumadin/Plavix/aspirin on (Patient has stopped Aspirin)  ____ Stop Anti-inflammatories on (Stop Ibuprofen NOW) Tylenol ok to take for pain if needed   ____ Stop supplements until after surgery.    ____ Bring C-Pap to the hospital.

## 2015-12-04 NOTE — Telephone Encounter (Signed)
Patient was seen by Dr. Richardson Landry and was told that her potassium was low. He said he would send over a prescription for you to approve so she can pick it up today. Patient will be having surgery 12/09/15 and is needing something for her potassium. Is there something she can take over the counter if not please send prescription to [facility]

## 2015-12-04 NOTE — Pre-Procedure Instructions (Signed)
Called Dr Marcello Moores regarding abnormal EKG.  Cardiac clearance requested has no cardiologist.  Called and faxed Dr Richardson Landry requesting a cardiac clearance.

## 2015-12-04 NOTE — Pre-Procedure Instructions (Signed)
Low K+ results called and faxed to [facility], @ Dr. Richardson Landry office.

## 2015-12-06 ENCOUNTER — Other Ambulatory Visit: Payer: Self-pay | Admitting: Family Medicine

## 2015-12-06 MED ORDER — POTASSIUM CHLORIDE CRYS ER 20 MEQ PO TBCR: 20.0000 meq | EXTENDED_RELEASE_TABLET | Freq: Every day | ORAL | Status: DC

## 2015-12-06 NOTE — Telephone Encounter (Signed)
Sent to [facility], but pharmacy may be closed

## 2015-12-09 ENCOUNTER — Encounter: Payer: Self-pay | Admitting: *Deleted

## 2015-12-09 ENCOUNTER — Ambulatory Visit: Payer: BLUE CROSS/BLUE SHIELD | Admitting: Certified Registered Nurse Anesthetist

## 2015-12-09 ENCOUNTER — Observation Stay
Admission: RE | Admit: 2015-12-09 | Discharge: 2015-12-10 | Disposition: A | Discharge status: Home / Self Care | Payer: BLUE CROSS/BLUE SHIELD | Source: Ambulatory Visit | Attending: Otolaryngology | Admitting: Otolaryngology

## 2015-12-09 ENCOUNTER — Encounter
Admission: RE | Disposition: A | Discharge status: Home / Self Care | Payer: Self-pay | Source: Ambulatory Visit | Attending: Otolaryngology

## 2015-12-09 DIAGNOSIS — Z79899 Other long term (current) drug therapy: Secondary | ICD-10-CM | POA: Diagnosis not present

## 2015-12-09 DIAGNOSIS — E669 Obesity, unspecified: Secondary | ICD-10-CM | POA: Insufficient documentation

## 2015-12-09 DIAGNOSIS — I10 Essential (primary) hypertension: Secondary | ICD-10-CM | POA: Diagnosis not present

## 2015-12-09 DIAGNOSIS — M1990 Unspecified osteoarthritis, unspecified site: Secondary | ICD-10-CM | POA: Diagnosis not present

## 2015-12-09 DIAGNOSIS — Z7982 Long term (current) use of aspirin: Secondary | ICD-10-CM | POA: Insufficient documentation

## 2015-12-09 DIAGNOSIS — E1140 Type 2 diabetes mellitus with diabetic neuropathy, unspecified: Secondary | ICD-10-CM | POA: Insufficient documentation

## 2015-12-09 DIAGNOSIS — E785 Hyperlipidemia, unspecified: Secondary | ICD-10-CM | POA: Insufficient documentation

## 2015-12-09 DIAGNOSIS — E049 Nontoxic goiter, unspecified: Secondary | ICD-10-CM | POA: Insufficient documentation

## 2015-12-09 DIAGNOSIS — E042 Nontoxic multinodular goiter: Secondary | ICD-10-CM | POA: Diagnosis present

## 2015-12-09 DIAGNOSIS — Z6837 Body mass index (BMI) 37.0-37.9, adult: Secondary | ICD-10-CM | POA: Insufficient documentation

## 2015-12-09 HISTORY — PX: THYROIDECTOMY: SHX17

## 2015-12-09 LAB — GLUCOSE, CAPILLARY
GLUCOSE-CAPILLARY: 111 mg/dL — AB (ref 65–99)
GLUCOSE-CAPILLARY: 129 mg/dL — AB (ref 65–99)
Glucose-Capillary: 85 mg/dL (ref 65–99)
Glucose-Capillary: 134 mg/dL — ABNORMAL HIGH (ref 65–99)
Glucose-Capillary: 104 mg/dL — ABNORMAL HIGH (ref 65–99)

## 2015-12-09 LAB — CREATININE, SERUM
Creatinine, Ser: 1.04 mg/dL — ABNORMAL HIGH (ref 0.44–1.00)
GFR calc non Af Amer: 60 mL/min — ABNORMAL LOW (ref >60)

## 2015-12-09 LAB — CALCIUM: Calcium: 8.7 mg/dL — ABNORMAL LOW (ref 8.9–10.3)

## 2015-12-09 LAB — POCT I-STAT 4, (NA,K, GLUC, HGB,HCT)
GLUCOSE: 93 mg/dL (ref 65–99)
HCT: 44 % (ref 36.0–46.0)
Hemoglobin: 15 g/dL (ref 12.0–15.0)
POTASSIUM: 3.5 mmol/L (ref 3.5–5.1)
SODIUM: 141 mmol/L (ref 135–145)

## 2015-12-09 LAB — CBC
HCT: 40.6 % (ref 35.0–47.0)
Hemoglobin: 13.4 g/dL (ref 12.0–16.0)
MCH: 27.8 pg (ref 26.0–34.0)
MCHC: 33 g/dL (ref 32.0–36.0)
MCV: 84.4 fL (ref 80.0–100.0)
PLATELETS: 182 10*3/uL (ref 150–440)
RBC: 4.81 MIL/uL (ref 3.80–5.20)
RDW: 15 % — AB (ref 11.5–14.5)
WBC: 10.5 10*3/uL (ref 3.6–11.0)

## 2015-12-09 LAB — MAGNESIUM: Magnesium: 1.7 mg/dL (ref 1.7–2.4)

## 2015-12-09 LAB — POTASSIUM: Potassium: 3.7 mmol/L (ref 3.5–5.1)

## 2015-12-09 SURGERY — THYROIDECTOMY
Anesthesia: General | Site: Throat | Wound class: Clean Contaminated

## 2015-12-09 MED ORDER — ONDANSETRON HCL 4 MG PO TABS: 4.0000 mg | ORAL_TABLET | ORAL | Status: DC | PRN

## 2015-12-09 MED ORDER — LIDOCAINE-EPINEPHRINE (PF) 1 %-1:200000 IJ SOLN
INTRAMUSCULAR | Status: AC
  Filled 2015-12-09: qty 30

## 2015-12-09 MED ORDER — PROPOFOL 10 MG/ML IV BOLUS
INTRAVENOUS | Status: DC | PRN
  Administered 2015-12-09: 150 mg via INTRAVENOUS

## 2015-12-09 MED ORDER — HYDROCODONE-ACETAMINOPHEN 5-325 MG PO TABS
1.0000 | ORAL_TABLET | ORAL | Status: DC | PRN
  Administered 2015-12-09: 1 via ORAL
  Filled 2015-12-09: qty 1

## 2015-12-09 MED ORDER — GABAPENTIN 300 MG PO CAPS
300.0000 mg | ORAL_CAPSULE | Freq: Every day | ORAL | Status: DC
  Administered 2015-12-09: 300 mg via ORAL
  Filled 2015-12-09: qty 1

## 2015-12-09 MED ORDER — PHENYLEPHRINE HCL 10 MG/ML IJ SOLN
10.0000 mg | INTRAVENOUS | Status: DC | PRN
  Administered 2015-12-09: 50 ug/min via INTRAVENOUS

## 2015-12-09 MED ORDER — FENTANYL CITRATE (PF) 100 MCG/2ML IJ SOLN
INTRAMUSCULAR | Status: DC | PRN
  Administered 2015-12-09: 50 ug via INTRAVENOUS
  Administered 2015-12-09: 100 ug via INTRAVENOUS

## 2015-12-09 MED ORDER — LIDOCAINE HCL (CARDIAC) 20 MG/ML IV SOLN
INTRAVENOUS | Status: DC | PRN
  Administered 2015-12-09: 80 mg via INTRAVENOUS

## 2015-12-09 MED ORDER — POTASSIUM CHLORIDE IN NACL 20-0.9 MEQ/L-% IV SOLN
INTRAVENOUS | Status: DC
  Administered 2015-12-09 (×2): via INTRAVENOUS
  Filled 2015-12-09 (×4): qty 1000

## 2015-12-09 MED ORDER — INSULIN ASPART 100 UNIT/ML ~~LOC~~ SOLN
0.0000 [IU] | SUBCUTANEOUS | Status: DC
  Administered 2015-12-09 (×2): 2 [IU] via SUBCUTANEOUS
  Administered 2015-12-10: 3 [IU] via SUBCUTANEOUS
  Filled 2015-12-09 (×2): qty 2
  Filled 2015-12-09: qty 3

## 2015-12-09 MED ORDER — ACETAMINOPHEN 10 MG/ML IV SOLN
INTRAVENOUS | Status: AC
  Filled 2015-12-09: qty 100

## 2015-12-09 MED ORDER — IRBESARTAN 150 MG PO TABS
300.0000 mg | ORAL_TABLET | Freq: Every day | ORAL | Status: DC
Start: 2015-12-09 — End: 2015-12-10
  Administered 2015-12-09: 300 mg via ORAL
  Filled 2015-12-09: qty 1
  Filled 2015-12-09: qty 2

## 2015-12-09 MED ORDER — ATORVASTATIN CALCIUM 20 MG PO TABS
40.0000 mg | ORAL_TABLET | Freq: Every day | ORAL | Status: DC
  Administered 2015-12-09: 40 mg via ORAL
  Filled 2015-12-09: qty 2

## 2015-12-09 MED ORDER — FENTANYL CITRATE (PF) 100 MCG/2ML IJ SOLN
25.0000 ug | INTRAMUSCULAR | Status: DC | PRN
  Administered 2015-12-09 (×4): 25 ug via INTRAVENOUS

## 2015-12-09 MED ORDER — BACITRACIN 500 UNIT/GM EX OINT
TOPICAL_OINTMENT | CUTANEOUS | Status: DC | PRN
  Administered 2015-12-09: 1 via TOPICAL

## 2015-12-09 MED ORDER — ACETAMINOPHEN 10 MG/ML IV SOLN
INTRAVENOUS | Status: DC | PRN
  Administered 2015-12-09: 1000 mg via INTRAVENOUS

## 2015-12-09 MED ORDER — PHENYLEPHRINE HCL 10 MG/ML IJ SOLN
INTRAMUSCULAR | Status: DC | PRN
Start: 2015-12-09 — End: 2015-12-09
  Administered 2015-12-09: 100 ug via INTRAVENOUS

## 2015-12-09 MED ORDER — MIDAZOLAM HCL 2 MG/2ML IJ SOLN
INTRAMUSCULAR | Status: DC | PRN
  Administered 2015-12-09: 1 mg via INTRAVENOUS

## 2015-12-09 MED ORDER — ONDANSETRON HCL 4 MG/2ML IJ SOLN
INTRAMUSCULAR | Status: DC | PRN
  Administered 2015-12-09: 4 mg via INTRAVENOUS

## 2015-12-09 MED ORDER — SIMVASTATIN 20 MG PO TABS: 20.0000 mg | ORAL_TABLET | Freq: Every day | ORAL | Status: DC

## 2015-12-09 MED ORDER — FAMOTIDINE 20 MG PO TABS
20.0000 mg | ORAL_TABLET | Freq: Once | ORAL | Status: AC
  Administered 2015-12-09: 20 mg via ORAL

## 2015-12-09 MED ORDER — BACITRACIN ZINC 500 UNIT/GM EX OINT
TOPICAL_OINTMENT | CUTANEOUS | Status: AC
  Filled 2015-12-09: qty 28.35

## 2015-12-09 MED ORDER — LIDOCAINE-EPINEPHRINE (PF) 1 %-1:200000 IJ SOLN
INTRAMUSCULAR | Status: DC | PRN
  Administered 2015-12-09: 6 mL

## 2015-12-09 MED ORDER — METFORMIN HCL 500 MG PO TABS
500.0000 mg | ORAL_TABLET | Freq: Every day | ORAL | Status: DC
  Administered 2015-12-09: 500 mg via ORAL
  Filled 2015-12-09: qty 1

## 2015-12-09 MED ORDER — BACITRACIN ZINC 500 UNIT/GM EX OINT
1.0000 "application " | TOPICAL_OINTMENT | Freq: Three times a day (TID) | CUTANEOUS | Status: DC
  Administered 2015-12-09 – 2015-12-10 (×3): 1 via TOPICAL
  Filled 2015-12-09 (×2): qty 0.9

## 2015-12-09 MED ORDER — PROMETHAZINE HCL 25 MG/ML IJ SOLN: 6.2500 mg | INTRAMUSCULAR | Status: DC | PRN

## 2015-12-09 MED ORDER — HYDROCHLOROTHIAZIDE 12.5 MG PO CAPS
12.5000 mg | ORAL_CAPSULE | Freq: Every day | ORAL | Status: DC
  Administered 2015-12-09 – 2015-12-10 (×2): 12.5 mg via ORAL
  Filled 2015-12-09 (×2): qty 1

## 2015-12-09 MED ORDER — DEXAMETHASONE SODIUM PHOSPHATE 10 MG/ML IJ SOLN
INTRAMUSCULAR | Status: DC | PRN
  Administered 2015-12-09: 4 mg via INTRAVENOUS

## 2015-12-09 MED ORDER — CALCIUM CARBONATE-VITAMIN D 500-200 MG-UNIT PO TABS
2.0000 | ORAL_TABLET | Freq: Two times a day (BID) | ORAL | Status: DC
  Administered 2015-12-09 – 2015-12-10 (×3): 2 via ORAL
  Filled 2015-12-09 (×3): qty 2

## 2015-12-09 MED ORDER — FENTANYL CITRATE (PF) 100 MCG/2ML IJ SOLN
INTRAMUSCULAR | Status: AC
  Filled 2015-12-09: qty 2

## 2015-12-09 MED ORDER — EPHEDRINE SULFATE 50 MG/ML IJ SOLN
INTRAMUSCULAR | Status: DC | PRN
  Administered 2015-12-09: 5 mg via INTRAVENOUS
  Administered 2015-12-09 (×2): 10 mg via INTRAVENOUS

## 2015-12-09 MED ORDER — MORPHINE SULFATE (PF) 2 MG/ML IV SOLN: 2.0000 mg | INTRAVENOUS | Status: DC | PRN

## 2015-12-09 MED ORDER — HEPARIN SODIUM (PORCINE) 5000 UNIT/ML IJ SOLN
5000.0000 [IU] | Freq: Three times a day (TID) | INTRAMUSCULAR | Status: DC
  Administered 2015-12-09 – 2015-12-10 (×3): 5000 [IU] via SUBCUTANEOUS
  Filled 2015-12-09 (×3): qty 1

## 2015-12-09 MED ORDER — SODIUM CHLORIDE 0.9 % IV SOLN
INTRAVENOUS | Status: DC
  Administered 2015-12-09 (×2): via INTRAVENOUS

## 2015-12-09 MED ORDER — DOCUSATE SODIUM 100 MG PO CAPS
100.0000 mg | ORAL_CAPSULE | Freq: Two times a day (BID) | ORAL | Status: DC
  Administered 2015-12-09 – 2015-12-10 (×3): 100 mg via ORAL
  Filled 2015-12-09 (×3): qty 1

## 2015-12-09 MED ORDER — SUCCINYLCHOLINE CHLORIDE 20 MG/ML IJ SOLN
INTRAMUSCULAR | Status: DC | PRN
  Administered 2015-12-09: 100 mg via INTRAVENOUS

## 2015-12-09 MED ORDER — FAMOTIDINE 20 MG PO TABS
ORAL_TABLET | ORAL | Status: AC
  Administered 2015-12-09: 20 mg via ORAL
  Filled 2015-12-09: qty 1

## 2015-12-09 MED ORDER — ONDANSETRON HCL 4 MG/2ML IJ SOLN: 4.0000 mg | INTRAMUSCULAR | Status: DC | PRN

## 2015-12-09 MED ORDER — REMIFENTANIL HCL 1 MG IV SOLR
INTRAVENOUS | Status: DC | PRN
  Administered 2015-12-09: 09:00:00 via INTRAVENOUS
  Administered 2015-12-09: .1 ug/kg/min via INTRAVENOUS

## 2015-12-09 SURGICAL SUPPLY — 35 items
BLADE SURG 15 STRL LF DISP TIS (BLADE) ×1 IMPLANT
BLADE SURG 15 STRL SS (BLADE) ×2
BULB RESERV EVAC DRAIN JP 100C (MISCELLANEOUS) ×6 IMPLANT
CANISTER SUCT 1200ML W/VALVE (MISCELLANEOUS) ×3 IMPLANT
CORD BIP STRL DISP 12FT (MISCELLANEOUS) ×6 IMPLANT
DRAIN CHANNEL JP 15F RND 16 (MISCELLANEOUS) ×6 IMPLANT
DRAIN TLS ROUND 10FR (DRAIN) IMPLANT
DRAPE MAG INST 16X20 L/F (DRAPES) ×3 IMPLANT
DRSG TEGADERM 2-3/8X2-3/4 SM (GAUZE/BANDAGES/DRESSINGS) ×3 IMPLANT
ELECT LARYNGEAL 6/7 (MISCELLANEOUS)
ELECT LARYNGEAL 8/9 (MISCELLANEOUS) ×3
ELECTRODE LARYNGEAL 6/7 (MISCELLANEOUS) IMPLANT
ELECTRODE LARYNGEAL 8/9 (MISCELLANEOUS) ×1 IMPLANT
FORCEPS JEWEL BIP 4-3/4 STR (INSTRUMENTS) ×3 IMPLANT
GLOVE BIO SURGEON STRL SZ7.5 (GLOVE) ×24 IMPLANT
GOWN STRL REUS W/ TWL LRG LVL3 (GOWN DISPOSABLE) ×3 IMPLANT
GOWN STRL REUS W/TWL LRG LVL3 (GOWN DISPOSABLE) ×6
HARMONIC SCALPEL FOCUS (MISCELLANEOUS) ×3 IMPLANT
HEMOSTAT SURGICEL 2X3 (HEMOSTASIS) ×3 IMPLANT
HOOK STAY BLUNT/RETRACTOR 5M (MISCELLANEOUS) ×3 IMPLANT
KIT RM TURNOVER STRD PROC AR (KITS) ×3 IMPLANT
LABEL OR SOLS (LABEL) ×3 IMPLANT
NS IRRIG 500ML POUR BTL (IV SOLUTION) ×3 IMPLANT
PACK HEAD/NECK (MISCELLANEOUS) ×3 IMPLANT
PAD GROUND ADULT SPLIT (MISCELLANEOUS) ×3 IMPLANT
PROBE NEUROSIGN BIPOL (MISCELLANEOUS) ×1 IMPLANT
PROBE NEUROSIGN BIPOLAR (MISCELLANEOUS) ×2
SPONGE KITTNER 5P (MISCELLANEOUS) ×15 IMPLANT
SPONGE XRAY 4X4 16PLY STRL (MISCELLANEOUS) ×9 IMPLANT
SUT PROLENE 3 0 PS 2 (SUTURE) ×3 IMPLANT
SUT SILK 2 0 (SUTURE)
SUT SILK 2 0 SH (SUTURE) ×3 IMPLANT
SUT SILK 2-0 18XBRD TIE 12 (SUTURE) IMPLANT
SUT VIC AB 4-0 RB1 18 (SUTURE) ×3 IMPLANT
SYSTEM CHEST DRAIN TLS 7FR (DRAIN) IMPLANT

## 2015-12-09 NOTE — Op Note (Signed)
12/09/2015  5:18 PM    Swara D Julien Nordmann  [id number]   Pre-Op Diagnosis:  MULTINODULAR GOITER  Post-op Diagnosis: MULTINODULAR GOITER  Procedure:   Total thyroidectomy Surgeon:  [name] First Assistant: None   Anesthesia:  General endotracheal  EBL:  123XX123 cc  Complications:  None  Findings: The right lobe of the thyroid gland was significantly enlarged with extension below the level of the clavicle and multiple nodules. The left lobe of the gland contained multiple nodules and was enlarged but did not have significant extension below the level of the clavicle.  Procedure: After the patient was identified in holding and the procedure was reviewed.  The patient was taken to the operating room and with the patient in a comfortable supine position,  general endotracheal anesthesia was induced without difficulty.  A nerve monitor on the endotracheal tube was visualized to be between the cords at the time of intubation. A proper time-out was performed, confirming the operative site and procedure.  The patient was placed on a shoulder roll and position. Skin was injected with 1% lidocaine with epinephrine 1-200,000. The patient was then prepped and draped in the usual sterile fashion. A 15 blade was used to incise the skin carrying the incision down through the subcutaneous tissues. The platysma muscle was divided with the Bovie. The strap muscles were identified in the midline and divided in the midline, retracting them laterally. Small anterior jugular veins were divided with the Harmonic scalpel. The strap muscles were retracted laterally off of the capsule of the right lobe of the thyroid gland. This was then finger dissected to loosen loose attachments to the gland. The strap muscles were somewhat adherent to the surface of the gland, and as needed were divided from the capsule of the gland with the Harmonic scalpel. Dissection proceeded superiorly, dissecting the superior pole of  the gland. The superior pole vessels were divided with the Harmonic scalpel. A small parathyroid gland was visualized and preserved during this dissection. The superior pole was retracted inferiorly and dissection proceeded around the lateral aspect of the gland, dividing vascular attachments with the Harmonic scalpel. The inferior pole was quite large extending below the level of the clavicle. This was dissected out as low as possible, however it was difficult to get to the bottom of the gland initially. There was actually a separate large inferior lobule that could be separated from the more superior aspect of the gland, and the decision was made to dissecting out the superior aspect of the gland separate from the inferior lobe. The superior lobe of the gland was then retracted medially and delivered from the wound. Dissection along the trachea revealed the recurrent laryngeal nerve which stimulated properly. The superior lobe of the gland was then dissected off of the trachea, dividing Berry's ligament with the nerve carefully protected. The superior lobe of the right gland was then divided at the isthmus and a small attachment to the inferior lobe divided, resecting the superior lobe completely which was set aside and sent as a separate specimen. Next attention was turned to the remaining inferior lobe which was carefully dissected out, using a Babcock to grasp the gland and pull it from the chest. Careful dissection around the gland under direct visualization was performed, dividing attachments with the Harmonic scalpel. I was able to palpate the innominate artery and avoid injury to it, which was just deep to the inferior lobe. Inferior pole vessels were identified and divided with the Harmonic scalpel as  they were encountered. A small structure consistent with the parathyroid gland was noted inferiorly as well, and preserved. The remainder of the right lobe of the thyroid gland was thus delivered and sent as  a separate specimen.  Next the left lobe of the thyroid gland was dissected in the same fashion as described above, however on this side the gland was smaller though still enlarged, and could be removed as a single left lobe specimen. Once again the superior and inferior pole vessels were divided right at the capsule of the gland and structures consistent with parathyroid tissue was superiorly. The inferior parathyroid on this side was not visualized, however I did not see any obvious parathyroid tissue on the specimen as it was dissected. Retracting the gland medially the recurrent laryngeal nerve was identified and confirmed with the stimulator. This was then carefully protected as the gland was dissected away from the trachea and Berry's ligament divided. The left lobe was delivered and sent as a specimen.   The wound was then irrigated and inspected for bleeding. # 7 JP drains were placed on either side of the trachea with the drains coming out through the skin just below the wound. The drains were secured with 4-0 silk suture. Surgicel was placed on either side of the trachea to control minor oozing within the wound, as well as the inferior aspect of the wound on the right where there was some minor oozing. The strap muscles were then reapproximated with 4-0 Vicryl suture. The platysma and subcutaneous tissues were also closed with 4-0 Vicryl suture. The skin was closed with a 3-0 running subcuticular Prolene suture. The patient was then returned to the anesthesiologist for awakening. The patient was awakened and taken to recovery room in good condition postoperatively.  Disposition:   PACU then transferred to floor  Plan: The patient is to be admitted for observation and monitoring of serum calcium, with potential discharge tomorrow if serum calcium is stable overnight and showing adequate progress of recovery.  [name] 12/09/2015 5:18 PM

## 2015-12-09 NOTE — Transfer of Care (Signed)
Immediate Anesthesia Transfer of Care Note  Patient: Samantha Copeland  Procedure(s) Performed: Procedure(s): THYROIDECTOMY (N/A)  Patient Location: PACU  Anesthesia Type:General  Level of Consciousness: awake  Airway & Oxygen Therapy: Patient Spontanous Breathing and Patient connected to nasal cannula oxygen  Post-op Assessment: Report given to RN and Post -op Vital signs reviewed and stable  Post vital signs: Reviewed and stable  Last Vitals:  Filed Vitals:   12/09/15 0606  BP: 135/83  Pulse: 72  Temp: 36.3 C  Resp: 14    Complications: No apparent anesthesia complications

## 2015-12-09 NOTE — Anesthesia Preprocedure Evaluation (Addendum)
Anesthesia Evaluation  Patient identified by MRN, date of birth, ID band Patient awake    Reviewed: Allergy & Precautions, H&P , NPO status , Patient's Chart, lab work & pertinent test results, reviewed documented beta blocker date and time   History of Anesthesia Complications Negative for: history of anesthetic complications  Airway Mallampati: I  TM Distance: >3 FB Neck ROM: full    Dental no notable dental hx. (+) Missing,    Pulmonary neg pulmonary ROS,    Pulmonary exam normal breath sounds clear to auscultation       Cardiovascular Exercise Tolerance: Good hypertension, (-) angina(-) CAD, (-) Past MI, (-) Cardiac Stents and (-) CABG Normal cardiovascular exam+ dysrhythmias (Sinus bradycardia, negative initial work-up, but echo and stress test scheduled for after surgery.) (-) Valvular Problems/Murmurs Rhythm:regular Rate:Normal     Neuro/Psych neg Seizures  Neuromuscular disease (peripheral neuropathy) negative psych ROS   GI/Hepatic negative GI ROS, Neg liver ROS,   Endo/Other  diabetes, Poorly Controlled, Oral Hypoglycemic AgentsThyroid nodule, no symptoms of tracheal compression.  Renal/GU CRFRenal disease  negative genitourinary   Musculoskeletal   Abdominal   Peds  Hematology negative hematology ROS (+)   Anesthesia Other Findings Past Medical History:   Hypertension                                                 Hot flashes                                                  Bilateral carpal tunnel syndrome                             Neuropathy (HCC)                                             Hyperlipidemia                                               Obesity                                                      Snoring                                                      Microalbuminuria                                             Ganglion of right wrist  Multinodular goiter                                          Diabetes mellitus without complication (HCC)                 Arthritis                                                    Neuropathy due to secondary diabetes mellitus *              Reproductive/Obstetrics negative OB ROS                           Anesthesia Physical Anesthesia Plan  ASA: III  Anesthesia Plan: General   Post-op Pain Management:    Induction: Intravenous  Airway Management Planned: Oral ETT  Additional Equipment:   Intra-op Plan:   Post-operative Plan: Extubation in OR  Informed Consent: I have reviewed the patients History and Physical, chart, labs and discussed the procedure including the risks, benefits and alternatives for the proposed anesthesia with the patient or authorized representative who has indicated his/her understanding and acceptance.   Dental Advisory Given  Plan Discussed with: Anesthesiologist, CRNA and Surgeon  Anesthesia Plan Comments:        Anesthesia Quick Evaluation

## 2015-12-09 NOTE — H&P (Signed)
History and physical reviewed and will be scanned in later. No change in medical status reported by the patient or family, appears stable for surgery. All questions regarding the procedure answered, and patient (or family if a child) expressed understanding of the procedure.  Victoria Euceda S @TODAY@ 

## 2015-12-09 NOTE — Anesthesia Procedure Notes (Signed)
Procedure Name: Intubation Date/Time: 12/09/2015 7:39 AM Performed by: Rockne Coons Pre-anesthesia Checklist: Patient identified, Emergency Drugs available, Patient being monitored, Timeout performed and Suction available Patient Re-evaluated:Patient Re-evaluated prior to inductionOxygen Delivery Method: Circle system utilized Preoxygenation: Pre-oxygenation with 100% oxygen Intubation Type: IV induction Ventilation: Mask ventilation without difficulty Laryngoscope Size: Mac and 3 Grade View: Grade II Tube type: Oral Tube size: 7.0 mm Number of attempts: 1 Airway Equipment and Method: Stylet Placement Confirmation: ETT inserted through vocal cords under direct vision,  positive ETCO2 and breath sounds checked- equal and bilateral Secured at: 24 cm Tube secured with: Tape Dental Injury: Teeth and Oropharynx as per pre-operative assessment

## 2015-12-09 NOTE — Telephone Encounter (Signed)
Left voice message to inform patient that prescription has been sent to her pharmacy

## 2015-12-09 NOTE — Progress Notes (Signed)
Samantha Copeland, Samantha Copeland [id number] [birth year] [name], MD   SUBJECTIVE: This 54 y.o. year old female is status post THYROIDECTOMY. She has some soreness but has been able to swallow liquids and pills. Initially the nurses were having some difficulty with the drains holding but now they are holding suction properly.  Medications:  Current Facility-Administered Medications  Medication Dose Route Frequency Provider Last Rate Last Dose  . 0.9 % NaCl with KCl 20 mEq/ L  infusion   Intravenous Continuous Clyde Canterbury, MD 100 mL/hr at 12/09/15 1311    . atorvastatin (LIPITOR) tablet 40 mg  40 mg Oral q1800 Clyde Canterbury, MD      . bacitracin ointment 1 application  1 application Topical 3 times per day Clyde Canterbury, MD   1 application at 25/00/37 1503  . calcium-vitamin D (OSCAL WITH D) 500-200 MG-UNIT per tablet 2 tablet  2 tablet Oral BID Clyde Canterbury, MD   2 tablet at 12/09/15 1344  . docusate sodium (COLACE) capsule 100 mg  100 mg Oral BID Clyde Canterbury, MD   100 mg at 12/09/15 1345  . fentaNYL (SUBLIMAZE) 100 MCG/2ML injection           . gabapentin (NEURONTIN) capsule 300 mg  300 mg Oral QHS Clyde Canterbury, MD      . heparin injection 5,000 Units  5,000 Units Subcutaneous 3 times per day Clyde Canterbury, MD   5,000 Units at 12/09/15 1503  . hydrochlorothiazide (MICROZIDE) capsule 12.5 mg  12.5 mg Oral Daily Clyde Canterbury, MD   12.5 mg at 12/09/15 1344  . HYDROcodone-acetaminophen (NORCO/VICODIN) 5-325 MG per tablet 1-2 tablet  1-2 tablet Oral Q4H PRN Clyde Canterbury, MD      . insulin aspart (novoLOG) injection 0-24 Units  0-24 Units Subcutaneous 6 times per day Clyde Canterbury, MD   2 Units at 12/09/15 1708  . irbesartan (AVAPRO) tablet 300 mg  300 mg Oral Daily Clyde Canterbury, MD   300 mg at 12/09/15 1345  . metFORMIN (GLUCOPHAGE) tablet 500 mg  500 mg Oral QHS Clyde Canterbury, MD      . morphine 2 MG/ML injection 2-4 mg  2-4 mg Intravenous Q2H PRN Clyde Canterbury, MD      . ondansetron [facility]) tablet 4 mg  4  mg Oral Q4H PRN Clyde Canterbury, MD       Or  . ondansetron [facility]) injection 4 mg  4 mg Intravenous Q4H PRN Clyde Canterbury, MD      .  Medications Prior to Admission  Medication Sig Dispense Refill  . atorvastatin (LIPITOR) 40 MG tablet Take 40 mg by mouth daily at 6 PM.    . calcium carbonate (TUMS - DOSED IN MG ELEMENTAL CALCIUM) 500 MG chewable tablet Chew 2 tablets by mouth daily.    . cholecalciferol (VITAMIN D) 1000 UNITS tablet Take 1,000 Units by mouth daily.    Marland Kitchen gabapentin (NEURONTIN) 300 MG capsule Take 1 capsule (300 mg total) by mouth at bedtime. 90 capsule 1  . glucose blood test strip     . irbesartan-hydrochlorothiazide (AVALIDE) 300-12.5 MG per tablet Take 1 tablet by mouth daily. 90 tablet 1  . potassium chloride SA (K-DUR,KLOR-CON) 20 MEQ tablet Take 1 tablet (20 mEq total) by mouth daily. 30 tablet 3  . simvastatin (ZOCOR) 20 MG tablet Take 1 tablet (20 mg total) by mouth daily at 6 PM. 90 tablet 1  . aspirin 81 MG tablet Take 81 mg by mouth daily.     . Dulaglutide (  TRULICITY) 1.5 JI/9.6VE SOPN INJECT 1.5MG SUBCUTANEOUSLY ONCE A WEEK (Patient taking differently: Inject into the skin. INJECT 1.5MG SUBCUTANEOUSLY ONCE A WEEK (SUNDAY)) 4 pen 2  . metFORMIN (GLUCOPHAGE) 500 MG tablet Take 500 mg by mouth at bedtime.      OBJECTIVE:  PHYSICAL EXAM  Vitals: Blood pressure 103/63, pulse 64, temperature 97.5 F (36.4 C), temperature source Oral, resp. rate 16, height _0  (1.676 m), weight 103.42 kg (228 lb), SpO2 99 %.. General: Well-developed, Well-nourished in no acute distress Mood: Mood and affect well adjusted, pleasant and cooperative. Orientation: Grossly alert and oriented. Vocal Quality: No hoarseness. Communicates verbally. head and Face: NCAT. No facial asymmetry. No visible skin lesions. No significant facial scars. No tenderness with sinus percussion. Facial strength normal and symmetric. Neck: Supple and symmetric with no palpable masses, tenderness or  crepitance. The trachea is midline. The neck wound shows no signs of hematoma. Sutures are in place and intact. Drains are in place and appear to be holding suction well.  Respiratory: Normal respiratory effort without labored breathing. No stridor  MEDICAL DECISION MAKING: Data Review:  Results for orders placed or performed during the hospital encounter of 12/09/15 (from the past 48 hour(s))  Glucose, capillary     Status: None   Collection Time: 12/09/15  6:03 AM  Result Value Ref Range   Glucose-Capillary 85 65 - 99 mg/dL   Comment 1 Notify RN   I-STAT 4, (NA,K, GLUC, HGB,HCT)     Status: None   Collection Time: 12/09/15  6:28 AM  Result Value Ref Range   Sodium 141 135 - 145 mmol/L   Potassium 3.5 3.5 - 5.1 mmol/L   Glucose, Bld 93 65 - 99 mg/dL   HCT 44.0 36.0 - 46.0 %   Hemoglobin 15.0 12.0 - 15.0 g/dL  Glucose, capillary     Status: Abnormal   Collection Time: 12/09/15 10:59 AM  Result Value Ref Range   Glucose-Capillary 111 (H) 65 - 99 mg/dL  Glucose, capillary     Status: Abnormal   Collection Time: 12/09/15  1:17 PM  Result Value Ref Range   Glucose-Capillary 134 (H) 65 - 99 mg/dL  Potassium     Status: None   Collection Time: 12/09/15  4:35 PM  Result Value Ref Range   Potassium 3.7 3.5 - 5.1 mmol/L  CBC     Status: Abnormal   Collection Time: 12/09/15  4:35 PM  Result Value Ref Range   WBC 10.5 3.6 - 11.0 K/uL   RBC 4.81 3.80 - 5.20 MIL/uL   Hemoglobin 13.4 12.0 - 16.0 g/dL   HCT 40.6 35.0 - 47.0 %   MCV 84.4 80.0 - 100.0 fL   MCH 27.8 26.0 - 34.0 pg   MCHC 33.0 32.0 - 36.0 g/dL   RDW 15.0 (H) 11.5 - 14.5 %   Platelets 182 150 - 440 K/uL  Creatinine, serum     Status: Abnormal   Collection Time: 12/09/15  4:35 PM  Result Value Ref Range   Creatinine, Ser 1.04 (H) 0.44 - 1.00 mg/dL   GFR calc non Af Amer 60 (L) >60 mL/min   GFR calc Af Amer >60 >60 mL/min    Comment: (NOTE) The eGFR has been calculated using the CKD EPI equation. This calculation has not  been validated in all clinical situations. eGFR's persistently <60 mL/min signify possible Chronic Kidney Disease.   Calcium     Status: Abnormal   Collection Time: 12/09/15  4:35 PM  Result Value Ref Range   Calcium 8.7 (L) 8.9 - 10.3 mg/dL  Magnesium     Status: None   Collection Time: 12/09/15  4:35 PM  Result Value Ref Range   Magnesium 1.7 1.7 - 2.4 mg/dL  Glucose, capillary     Status: Abnormal   Collection Time: 12/09/15  4:36 PM  Result Value Ref Range   Glucose-Capillary 129 (H) 65 - 99 mg/dL  . No results found..   ASSESSMENT: Doing well after total thyroidectomy. Her voice sounds strong with no evidence of hoarseness. Minimal drop in the calcium.   PLAN: We can observe the calcium for now, as she is already getting supplemental calcium. We'll recheck serum calcium in the morning. Continue drains.

## 2015-12-10 DIAGNOSIS — E042 Nontoxic multinodular goiter: Secondary | ICD-10-CM | POA: Diagnosis not present

## 2015-12-10 LAB — GLUCOSE, CAPILLARY
GLUCOSE-CAPILLARY: 134 mg/dL — AB (ref 65–99)
Glucose-Capillary: 68 mg/dL (ref 65–99)

## 2015-12-10 LAB — SURGICAL PATHOLOGY

## 2015-12-10 LAB — CALCIUM: CALCIUM: 8.6 mg/dL — AB (ref 8.9–10.3)

## 2015-12-10 MED ORDER — CALCIUM CARBONATE-VITAMIN D 500-200 MG-UNIT PO TABS: 1.0000 | ORAL_TABLET | Freq: Three times a day (TID) | ORAL | Status: DC

## 2015-12-10 MED ORDER — DOCUSATE SODIUM 100 MG PO CAPS: 100.0000 mg | ORAL_CAPSULE | Freq: Two times a day (BID) | ORAL | Status: DC

## 2015-12-10 MED ORDER — LEVOTHYROXINE SODIUM 125 MCG PO TABS: 125.0000 ug | ORAL_TABLET | Freq: Every day | ORAL | Status: DC

## 2015-12-10 MED ORDER — HYDROCODONE-ACETAMINOPHEN 5-325 MG PO TABS: 1.0000 | ORAL_TABLET | ORAL | Status: DC | PRN

## 2015-12-10 NOTE — Anesthesia Postprocedure Evaluation (Signed)
Anesthesia Post Note  Patient: Samantha Copeland  Procedure(s) Performed: Procedure(s) (LRB): THYROIDECTOMY (N/A)  Patient location during evaluation: PACU Anesthesia Type: General Level of consciousness: awake and alert Pain management: pain level controlled Vital Signs Assessment: post-procedure vital signs reviewed and stable Respiratory status: spontaneous breathing, nonlabored ventilation, respiratory function stable and patient connected to nasal cannula oxygen Cardiovascular status: blood pressure returned to baseline and stable Postop Assessment: no signs of nausea or vomiting Anesthetic complications: no    Last Vitals:  Filed Vitals:   12/10/15 0500 12/10/15 1007  BP: 126/64 131/63  Pulse: 50 64  Temp: 36.5 C   Resp: 16     Last Pain:  Filed Vitals:   12/10/15 1007  PainSc: Asleep                 Martha Clan

## 2015-12-10 NOTE — Progress Notes (Signed)
IV and tele were removed. Discharge instructions, follow-up appointments, and prescriptions were provided to the pt. The pt was taken downstairs via wheelchair.

## 2015-12-10 NOTE — Progress Notes (Signed)
Patient ID: Samantha Copeland, female   DOB: [birth year], 54 y.o.   MRN: [id number] Samantha, Copeland [id number] [birth year] Samantha Nearing, MD   SUBJECTIVE: This 54 y.o. year old female is status post THYROIDECTOMY. She is doing well this AM and motivated to go home. Was able to swallow liquids and pills last night and feels like she can advance her diet.  Medications:  Current Facility-Administered Medications  Medication Dose Route Frequency Provider Last Rate Last Dose  . 0.9 % NaCl with KCl 20 mEq/ L  infusion   Intravenous Continuous Clyde Canterbury, MD 100 mL/hr at 12/09/15 2234    . atorvastatin (LIPITOR) tablet 40 mg  40 mg Oral q1800 Clyde Canterbury, MD   40 mg at 12/09/15 1749  . bacitracin ointment 1 application  1 application Topical 3 times per day Clyde Canterbury, MD   1 application at 54/62/70 0540  . calcium-vitamin D (OSCAL WITH D) 500-200 MG-UNIT per tablet 2 tablet  2 tablet Oral BID Clyde Canterbury, MD   2 tablet at 12/09/15 2311  . docusate sodium (COLACE) capsule 100 mg  100 mg Oral BID Clyde Canterbury, MD   100 mg at 12/09/15 2233  . gabapentin (NEURONTIN) capsule 300 mg  300 mg Oral QHS Clyde Canterbury, MD   300 mg at 12/09/15 2233  . heparin injection 5,000 Units  5,000 Units Subcutaneous 3 times per day Clyde Canterbury, MD   5,000 Units at 12/10/15 [phone]  . hydrochlorothiazide (MICROZIDE) capsule 12.5 mg  12.5 mg Oral Daily Clyde Canterbury, MD   12.5 mg at 12/09/15 1344  . HYDROcodone-acetaminophen (NORCO/VICODIN) 5-325 MG per tablet 1-2 tablet  1-2 tablet Oral Q4H PRN Clyde Canterbury, MD   1 tablet at 12/09/15 2233  . insulin aspart (novoLOG) injection 0-24 Units  0-24 Units Subcutaneous 6 times per day Clyde Canterbury, MD   3 Units at 12/10/15 0539  . irbesartan (AVAPRO) tablet 300 mg  300 mg Oral Daily Clyde Canterbury, MD   300 mg at 12/09/15 1345  . metFORMIN (GLUCOPHAGE) tablet 500 mg  500 mg Oral QHS Clyde Canterbury, MD   500 mg at 12/09/15 2233  . morphine 2 MG/ML injection 2-4 mg  2-4 mg  Intravenous Q2H PRN Clyde Canterbury, MD      . ondansetron [facility]) tablet 4 mg  4 mg Oral Q4H PRN Clyde Canterbury, MD       Or  . ondansetron [facility]) injection 4 mg  4 mg Intravenous Q4H PRN Clyde Canterbury, MD      .  Medications Prior to Admission  Medication Sig Dispense Refill  . atorvastatin (LIPITOR) 40 MG tablet Take 40 mg by mouth daily at 6 PM.    . calcium carbonate (TUMS - DOSED IN MG ELEMENTAL CALCIUM) 500 MG chewable tablet Chew 2 tablets by mouth daily.    . cholecalciferol (VITAMIN D) 1000 UNITS tablet Take 1,000 Units by mouth daily.    Marland Kitchen gabapentin (NEURONTIN) 300 MG capsule Take 1 capsule (300 mg total) by mouth at bedtime. 90 capsule 1  . glucose blood test strip     . irbesartan-hydrochlorothiazide (AVALIDE) 300-12.5 MG per tablet Take 1 tablet by mouth daily. 90 tablet 1  . potassium chloride SA (K-DUR,KLOR-CON) 20 MEQ tablet Take 1 tablet (20 mEq total) by mouth daily. 30 tablet 3  . simvastatin (ZOCOR) 20 MG tablet Take 1 tablet (20 mg total) by mouth daily at 6 PM. 90 tablet 1  . aspirin 81 MG tablet Take 81 mg  by mouth daily.     . Dulaglutide (TRULICITY) 1.5 ST/4.1DQ SOPN INJECT 1.'5MG'$  SUBCUTANEOUSLY ONCE A WEEK (Patient taking differently: Inject into the skin. INJECT 1.'5MG'$  SUBCUTANEOUSLY ONCE A WEEK (SUNDAY)) 4 pen 2  . metFORMIN (GLUCOPHAGE) 500 MG tablet Take 500 mg by mouth at bedtime.      OBJECTIVE:  PHYSICAL EXAM  Vitals: Blood pressure 126/64, pulse 50, temperature 97.7 F (36.5 C), temperature source Oral, resp. rate 16, height '5\' 6"'$  (1.676 m), weight 106.595 kg (235 lb), SpO2 100 %.. General: Well-developed, Well-nourished in no acute distress Mood: Mood and affect well adjusted, pleasant and cooperative. Orientation: Grossly alert and oriented. Vocal Quality: No hoarseness. Communicates verbally. head and Face: NCAT. No facial asymmetry. No visible skin lesions. No significant facial scars. No tenderness with sinus percussion. Facial strength normal and  symmetric. Neck: Supple and symmetric with no palpable masses, tenderness or crepitance. The trachea is midline. Neck wound is free of hematoma, clean and dry, and the sutures are intact. The right JP drain was removed. There was minimal serous output on that side this morning, and serous output is noted in the left drain which had more output over the past 24 hours.  Respiratory: Normal respiratory effort without labored breathing.  MEDICAL DECISION MAKING: Data Review:  Results for orders placed or performed during the hospital encounter of 12/09/15 (from the past 48 hour(s))  Glucose, capillary     Status: None   Collection Time: 12/09/15  6:03 AM  Result Value Ref Range   Glucose-Capillary 85 65 - 99 mg/dL   Comment 1 Notify RN   I-STAT 4, (NA,K, GLUC, HGB,HCT)     Status: None   Collection Time: 12/09/15  6:28 AM  Result Value Ref Range   Sodium 141 135 - 145 mmol/L   Potassium 3.5 3.5 - 5.1 mmol/L   Glucose, Bld 93 65 - 99 mg/dL   HCT 44.0 36.0 - 46.0 %   Hemoglobin 15.0 12.0 - 15.0 g/dL  Glucose, capillary     Status: Abnormal   Collection Time: 12/09/15 10:59 AM  Result Value Ref Range   Glucose-Capillary 111 (H) 65 - 99 mg/dL  Glucose, capillary     Status: Abnormal   Collection Time: 12/09/15  1:17 PM  Result Value Ref Range   Glucose-Capillary 134 (H) 65 - 99 mg/dL  Potassium     Status: None   Collection Time: 12/09/15  4:35 PM  Result Value Ref Range   Potassium 3.7 3.5 - 5.1 mmol/L  CBC     Status: Abnormal   Collection Time: 12/09/15  4:35 PM  Result Value Ref Range   WBC 10.5 3.6 - 11.0 K/uL   RBC 4.81 3.80 - 5.20 MIL/uL   Hemoglobin 13.4 12.0 - 16.0 g/dL   HCT 40.6 35.0 - 47.0 %   MCV 84.4 80.0 - 100.0 fL   MCH 27.8 26.0 - 34.0 pg   MCHC 33.0 32.0 - 36.0 g/dL   RDW 15.0 (H) 11.5 - 14.5 %   Platelets 182 150 - 440 K/uL  Creatinine, serum     Status: Abnormal   Collection Time: 12/09/15  4:35 PM  Result Value Ref Range   Creatinine, Ser 1.04 (H) 0.44 -  1.00 mg/dL   GFR calc non Af Amer 60 (L) >60 mL/min   GFR calc Af Amer >60 >60 mL/min    Comment: (NOTE) The eGFR has been calculated using the CKD EPI equation. This calculation has not been validated in  all clinical situations. eGFR's persistently <60 mL/min signify possible Chronic Kidney Disease.   Calcium     Status: Abnormal   Collection Time: 12/09/15  4:35 PM  Result Value Ref Range   Calcium 8.7 (L) 8.9 - 10.3 mg/dL  Magnesium     Status: None   Collection Time: 12/09/15  4:35 PM  Result Value Ref Range   Magnesium 1.7 1.7 - 2.4 mg/dL  Glucose, capillary     Status: Abnormal   Collection Time: 12/09/15  4:36 PM  Result Value Ref Range   Glucose-Capillary 129 (H) 65 - 99 mg/dL  Glucose, capillary     Status: Abnormal   Collection Time: 12/09/15 10:28 PM  Result Value Ref Range   Glucose-Capillary 104 (H) 65 - 99 mg/dL   Comment 1 Notify RN   Glucose, capillary     Status: Abnormal   Collection Time: 12/10/15  5:02 AM  Result Value Ref Range   Glucose-Capillary 134 (H) 65 - 99 mg/dL   Comment 1 Notify RN   Calcium     Status: Abnormal   Collection Time: 12/10/15  5:32 AM  Result Value Ref Range   Calcium 8.6 (L) 8.9 - 10.3 mg/dL  . No results found..   ASSESSMENT: Doing well after total thyroidectomy.   PLAN: There was a minimal drop in calcium, but nothing concerning. I will have her increase her calcium tablets to 3 times daily to compensate. She will be started on Synthroid and discharged home with Lortab for pain control. She made advance her diet as tolerated. Wound care was discussed. I will see her back in the morning for drain removal.   Samantha Nearing, MD 12/10/2015 8:20 AM

## 2016-02-19 ENCOUNTER — Other Ambulatory Visit: Payer: Self-pay | Admitting: Family Medicine

## 2016-02-19 DIAGNOSIS — E1121 Type 2 diabetes mellitus with diabetic nephropathy: Secondary | ICD-10-CM

## 2016-02-19 NOTE — Telephone Encounter (Signed)
Gave patient 1 sample out of our refrigerator, please send in a refill of patient medication. Thanks

## 2016-02-19 NOTE — Telephone Encounter (Signed)
Completely out of Trulicity, please send to [facility] [address]. Patient is suppose to take the shots on Sundays

## 2016-02-20 MED ORDER — DULAGLUTIDE 1.5 MG/0.5ML ~~LOC~~ SOAJ: 1.5000 mg | SUBCUTANEOUS | Status: DC

## 2016-03-03 ENCOUNTER — Ambulatory Visit (INDEPENDENT_AMBULATORY_CARE_PROVIDER_SITE_OTHER): Payer: BLUE CROSS/BLUE SHIELD | Admitting: Family Medicine

## 2016-03-03 ENCOUNTER — Encounter: Payer: Self-pay | Admitting: Family Medicine

## 2016-03-03 VITALS — BP 114/70 | HR 80 | Temp 97.7°F | Resp 14 | Ht 66.0 in | Wt 230.6 lb

## 2016-03-03 DIAGNOSIS — E669 Obesity, unspecified: Secondary | ICD-10-CM | POA: Diagnosis not present

## 2016-03-03 DIAGNOSIS — E785 Hyperlipidemia, unspecified: Secondary | ICD-10-CM

## 2016-03-03 DIAGNOSIS — E0789 Other specified disorders of thyroid: Secondary | ICD-10-CM

## 2016-03-03 DIAGNOSIS — G5600 Carpal tunnel syndrome, unspecified upper limb: Secondary | ICD-10-CM

## 2016-03-03 DIAGNOSIS — E890 Postprocedural hypothyroidism: Secondary | ICD-10-CM

## 2016-03-03 DIAGNOSIS — R809 Proteinuria, unspecified: Secondary | ICD-10-CM

## 2016-03-03 DIAGNOSIS — I10 Essential (primary) hypertension: Secondary | ICD-10-CM

## 2016-03-03 DIAGNOSIS — E1129 Type 2 diabetes mellitus with other diabetic kidney complication: Secondary | ICD-10-CM | POA: Diagnosis not present

## 2016-03-03 LAB — POCT GLYCOSYLATED HEMOGLOBIN (HGB A1C): HEMOGLOBIN A1C: 6.1

## 2016-03-03 MED ORDER — GABAPENTIN 300 MG PO CAPS: 300.0000 mg | ORAL_CAPSULE | Freq: Every day | ORAL | Status: DC

## 2016-03-03 MED ORDER — DULAGLUTIDE 1.5 MG/0.5ML ~~LOC~~ SOAJ: 1.5000 mg | SUBCUTANEOUS | Status: DC

## 2016-03-03 MED ORDER — IRBESARTAN-HYDROCHLOROTHIAZIDE 300-12.5 MG PO TABS: 1.0000 | ORAL_TABLET | Freq: Every day | ORAL | Status: DC

## 2016-03-03 MED ORDER — ATORVASTATIN CALCIUM 40 MG PO TABS: 40.0000 mg | ORAL_TABLET | Freq: Every day | ORAL | Status: DC

## 2016-03-03 NOTE — Progress Notes (Signed)
Name: Samantha Copeland   MRN: [id number]    DOB: [birth year]   Date:03/03/2016       Progress Note  Subjective  Chief Complaint  Chief Complaint  Patient presents with  . Medication Refill  . Diabetes    checks BG 2x low-68, high-160  . Hypertension  . Hyperlipidemia  . 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 She denies hypoglycemic episodes. Symptoms of carpal tunnel is a little worse and has been using two pills daily of gabapentin, but no longer has feet numbness of pain.  Continue life style modification. Eating fish twice weekly. Explained tree nuts are healthier . Urine micro is 20 in December and she is on ARB  HTN: bp is at goal, denies side effects of medications. Compliant with medication . No chest pain or SOB or palpitation, or dizziness.   Hyperlipidemia: currently on Lipitor 40 mg. Discussed ways to increase HDL   Obesity: changed diet and has been on Trulicity since June, she has gained a couple  lbs since last visit, but she is eating healthy but still not exercising, she will try to resume it soon.   Goiter: seeing Dr. Richardson Landry had total thyroidectomy in December, doing well, on Synthroid 100 mcg daily and has follow up with Dr. Gabriel Carina once more before she returns to me for management   Patient Active Problem List   Diagnosis Date Noted  . Benign essential HTN 05/30/2015  . Carpal tunnel syndrome 05/30/2015  . Dyslipidemia 05/30/2015  . 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 History of thyroidectomy, total 05/30/2015  . Microalbuminuria 05/30/2015  . Obesity (BMI 30-39.9) 05/30/2015  . Diabetes mellitus with renal manifestation ([place]) 05/30/2015    Past Surgical History  Procedure Laterality Date  . Abdominal  hysterectomy    . Breast biopsy Right     benign  . 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 Fine needle aspiration  10/01/2013    Thyroid-Dr. Gabriel Carina  . Thyroidectomy N/A 12/09/2015    Procedure: THYROIDECTOMY;  Surgeon: Clyde Canterbury, MD;  Location: [facility] ORS;  Service: ENT;  Laterality: N/A;    Family History  Problem Relation Age of Onset  . Diabetes Mother   . Diabetes Father   . Hypertension Father   . Hyperlipidemia Father   . Cancer Father   . Diabetes Sister     Social History   Social History  . Marital Status: Married    Spouse Name: N/A  . Number of Children: N/A  . Years of Education: N/A   Occupational History  . Not on file.   Social History Main Topics  . Smoking status: Never Smoker   . Smokeless tobacco: Never Used  . Alcohol Use: No  . Drug Use: No  . Sexual Activity:    Partners: Male   Other Topics Concern  . Not on file   Social History Narrative     Current outpatient prescriptions:  .  aspirin EC 81 MG tablet, Take by mouth., Disp: , Rfl:  .  atorvastatin (LIPITOR) 40 MG tablet, Take 40 mg by mouth daily at 6 PM., Disp: , Rfl:  .  calcium-vitamin D (OSCAL WITH  D) 500-200 MG-UNIT tablet, Take 1 tablet by mouth 3 (three) times daily after meals., Disp: 90 tablet, Rfl: 1 .  Cholecalciferol (VITAMIN D-1000 MAX ST) 1000 units tablet, Take by mouth., Disp: , Rfl:  .  docusate sodium (COLACE) 100 MG capsule, Take 1 capsule (100 mg total) by mouth 2 (two) times daily., Disp: 10 capsule, Rfl: 0 .  Dulaglutide (TRULICITY) 1.5 IZ/1.2WP SOPN, Inject 1.5 mg into the skin once a week. INJECT 1.5MG SUBCUTANEOUSLY ONCE A WEEK (SUNDAY), Disp: 4 pen, Rfl: 0 .  gabapentin (NEURONTIN) 300 MG capsule, Take 1 capsule (300 mg total) by mouth at bedtime., Disp: 90 capsule, Rfl: 1 .  glucose blood test strip, , Disp: , Rfl:  .  irbesartan-hydrochlorothiazide (AVALIDE) 300-12.5 MG per tablet, Take 1 tablet by mouth daily., Disp: 90  tablet, Rfl: 1 .  levothyroxine (SYNTHROID, LEVOTHROID) 100 MCG tablet, , Disp: , Rfl:  .  metFORMIN (GLUCOPHAGE) 500 MG tablet, Take 500 mg by mouth at bedtime., Disp: , Rfl:   No Known Allergies   ROS  Constitutional: Negative for fever or significant weight change.  Respiratory: Negative for cough and shortness of breath.   Cardiovascular: Negative for chest pain or palpitations.  Gastrointestinal: Negative for abdominal pain, no bowel changes.  Musculoskeletal: Negative for gait problem or joint swelling.  Skin: Negative for rash.  Neurological: Negative for dizziness or headache.  No other specific complaints in a complete review of systems (except as listed in HPI above).  Objective  Filed Vitals:   03/03/16 0809  BP: 114/70  Pulse: 80  Temp: 97.7 F (36.5 C)  TempSrc: Oral  Resp: 14  Height: _0  (1.676 m)  Weight: 230 lb 9.6 oz (104.599 kg)  SpO2: 95%    Body mass index is 37.24 kg/(m^2).  Physical Exam  Constitutional: Patient appears well-developed and well-nourished. Obese  No distress.  HEENT: head atraumatic, normocephalic, pupils equal and reactive to light,  neck supple, surgical site in good aspect, throat within normal limits Cardiovascular: Normal rate, regular rhythm and normal heart sounds.  No murmur heard. No BLE edema. Pulmonary/Chest: [place] normal and breath sounds normal. No respiratory distress. Abdominal: Soft.  There is no tenderness. Psychiatric: Patient has a normal mood and affect. behavior is normal. Judgment and thought content normal.  Recent Results (from the past 2160 hour(s))  Glucose, capillary     Status: None   Collection Time: 12/09/15  6:03 AM  Result Value Ref Range   Glucose-Capillary 85 65 - 99 mg/dL   Comment 1 Notify RN   I-STAT 4, (NA,K, GLUC, HGB,HCT)     Status: None   Collection Time: 12/09/15  6:28 AM  Result Value Ref Range   Sodium 141 135 - 145 mmol/L   Potassium 3.5 3.5 - 5.1 mmol/L   Glucose, Bld 93 65 -  99 mg/dL   HCT 44.0 36.0 - 46.0 %   Hemoglobin 15.0 12.0 - 15.0 g/dL  Surgical pathology     Status: None   Collection Time: 12/09/15  9:07 AM  Result Value Ref Range   SURGICAL PATHOLOGY      Surgical Pathology CASE: [id number] PATIENT: Apolonio Schneiders Surgical Pathology Report     SPECIMEN SUBMITTED: A. Thyroid gland, right superior lobe B. Thyroid gland, right inferior lobe C. Thyroid gland, left lobe  CLINICAL HISTORY: None provided  PRE-OPERATIVE DIAGNOSIS: Multinodular goiter  POST-OPERATIVE DIAGNOSIS: Same as pre-op    1  DIAGNOSIS: A, B, and C. THYROID GLAND, RIGHT SUPERIOR,  RIGHT INFERIOR, AND LEFT LOBES; TOTAL THYROIDECTOMY: - NODULAR HYPERPLASIA (MULTINODULAR GOITER). - ONE BENIGN LYMPH NODE ADJACENT TO RIGHT SUPERIOR LOBE.   GROSS DESCRIPTION: A. Labeled: right thyroid gland superior pole Type of procedure: total thyroidectomy Weight of specimen: 50 grams Size: of specimen: 7.6 x 4.5 x 3.8 cm Orientation: shaggy edge suggestive of margin inked black and remaining external surface blue Number of masses: 4 Location of mass(es): throughout Size(s) of mass(es): 1.0 x 1.0 x 1.0 up to 4.5 x 3.5 x 3.5 cm Description of mass (es): gelatinous multilobated, largest with focal calcified hemorrhagic areas Confinement to thyroid: grossly confined Margins: abutting but appear capsulated Description of remainder of the thyroid: beefy red Parathyroids: not identified Lymph nodes: none identified  Block summary: 1-5-representative of each nodule (4-5-representative largest nodule)   B. Labeled: right thyroid gland inferior lobe Type of procedure: total thyroidectomy Weight of specimen: 2 fragments, aggregate 28 grams Size: of specimen: 2.5 x 1.4 x 1.3 cm and 4.2 x 3.7 x 3.1 cm Orientation: external surface of smaller fragment inked black and larger fragment blue Number of masses: 2 nodules Location of mass(es): each fragment is a separate  nodule  Size(s) of mass(es): as previously described Description of mass(es): gelatinous pink to red with focal calcification and focal orange discoloration of largest nodule Confinement to thyroid: cannot be determined Margins: abutting but appear encapsu lated Description of remainder of the thyroid: focal beefy red Parathyroids: none noted Lymph nodes: none identified  Block summary: 1-representative smaller nodule 2-4-representative largest nodule   C. Labeled: left thyroid lobe Type of procedure: total thyroidectomy Weight of specimen: 37 grams Size: of specimen: 6.3 x 3.5 x 2.4 cm Orientation: shaggy area suggestive of margin inked yellow, remaining external surface cannot be oriented and is inked blue Number of masses: 4 Location of mass(es): throughout Size(s) of mass(es): 1.1 x 1.1 x 1.0 cm up to 2.8 x 2.1 x 2.0 cm Description of mass(es): multilobulated gelatinous with focal calcification Confinement to thyroid: all grossly confined and appear encapsulated Margins: abutting but appear encapsulated Description of remainder of the thyroid: small amount beefy red Parathyroids: not grossly identified Lymph nodes: none identified  Block summary: 1-5-representative all 4 nodules (4-5-representative largest  nodule)  Final Diagnosis performed by Bryan Lemma, MD.  Electronically signed 12/10/2015 2:27:26PM    The electronic signature indicates that the named Attending Pathologist has evaluated the specimen  Technical component performed at [facility], [address], [place], [place] [address] Lab: [phone] Dir: Darrick Penna. Evette Doffing, MD  Professional component performed at [facility], [facility], [place], [place], [place] [address] Lab: [phone] Dir: Dellia Nims. Rubinas, MD    Glucose, capillary     Status: Abnormal   Collection Time: 12/09/15 10:59 AM  Result Value Ref Range   Glucose-Capillary 111 (H) 65 - 99 mg/dL  Glucose, capillary      Status: Abnormal   Collection Time: 12/09/15  1:17 PM  Result Value Ref Range   Glucose-Capillary 134 (H) 65 - 99 mg/dL  Potassium     Status: None   Collection Time: 12/09/15  4:35 PM  Result Value Ref Range   Potassium 3.7 3.5 - 5.1 mmol/L  CBC     Status: Abnormal   Collection Time: 12/09/15  4:35 PM  Result Value Ref Range   WBC 10.5 3.6 - 11.0 K/uL   RBC 4.81 3.80 - 5.20 MIL/uL   Hemoglobin 13.4 12.0 - 16.0 g/dL   HCT 40.6 35.0 - 47.0 %  MCV 84.4 80.0 - 100.0 fL   MCH 27.8 26.0 - 34.0 pg   MCHC 33.0 32.0 - 36.0 g/dL   RDW 15.0 (H) 11.5 - 14.5 %   Platelets 182 150 - 440 K/uL  Creatinine, serum     Status: Abnormal   Collection Time: 12/09/15  4:35 PM  Result Value Ref Range   Creatinine, Ser 1.04 (H) 0.44 - 1.00 mg/dL   GFR calc non Af Amer 60 (L) >60 mL/min   GFR calc Af Amer >60 >60 mL/min    Comment: (NOTE) The eGFR has been calculated using the CKD EPI equation. This calculation has not been validated in all clinical situations. eGFR's persistently <60 mL/min signify possible Chronic Kidney Disease.   Calcium     Status: Abnormal   Collection Time: 12/09/15  4:35 PM  Result Value Ref Range   Calcium 8.7 (L) 8.9 - 10.3 mg/dL  Magnesium     Status: None   Collection Time: 12/09/15  4:35 PM  Result Value Ref Range   Magnesium 1.7 1.7 - 2.4 mg/dL  Glucose, capillary     Status: Abnormal   Collection Time: 12/09/15  4:36 PM  Result Value Ref Range   Glucose-Capillary 129 (H) 65 - 99 mg/dL  Glucose, capillary     Status: Abnormal   Collection Time: 12/09/15 10:28 PM  Result Value Ref Range   Glucose-Capillary 104 (H) 65 - 99 mg/dL   Comment 1 Notify RN   Glucose, capillary     Status: Abnormal   Collection Time: 12/10/15  5:02 AM  Result Value Ref Range   Glucose-Capillary 134 (H) 65 - 99 mg/dL   Comment 1 Notify RN   Calcium     Status: Abnormal   Collection Time: 12/10/15  5:32 AM  Result Value Ref Range   Calcium 8.6 (L) 8.9 - 10.3 mg/dL  Glucose,  capillary     Status: None   Collection Time: 12/10/15  8:50 AM  Result Value Ref Range   Glucose-Capillary 68 65 - 99 mg/dL   Comment 1 Notify RN   POCT HgB A1C     Status: Abnormal   Collection Time: 03/03/16  8:35 AM  Result Value Ref Range   Hemoglobin A1C 6.1      PHQ2/9: Depression screen [facility] 2/9 03/03/2016 12/03/2015 09/02/2015 06/01/2015  Decreased Interest 0 0 0 0  Down, Depressed, Hopeless 0 0 0 0  PHQ - 2 Score 0 0 0 0    Fall Risk: Fall Risk  03/03/2016 12/03/2015 09/02/2015 06/01/2015  Falls in the past year? No No No No     Functional Status Survey: Is the patient deaf or have difficulty hearing?: No Does the patient have difficulty seeing, even when wearing glasses/contacts?: No Does the patient have difficulty concentrating, remembering, or making decisions?: No Does the patient have difficulty walking or climbing stairs?: No Does the patient have difficulty dressing or bathing?: No Does the patient have difficulty doing errands alone such as visiting a doctor's office or shopping?: No    Assessment & Plan  1. Type 2 diabetes mellitus with microalbuminuria, without long-term current use of insulin (HCC)  Doing well, she will stop Metformin - POCT HgB A1C - Dulaglutide (TRULICITY) 1.5 IB/7.0WU SOPN; Inject 1.5 mg into the skin once a week. INJECT 1.5MG SUBCUTANEOUSLY ONCE A WEEK (SUNDAY)  Dispense: 4 pen; Refill: 5  2. Dyslipidemia  - atorvastatin (LIPITOR) 40 MG tablet; Take 1 tablet (40 mg total) by mouth daily  at 6 PM.  Dispense: 90 tablet; Refill: 1  3. Benign essential HTN  - irbesartan-hydrochlorothiazide (AVALIDE) 300-12.5 MG tablet; Take 1 tablet by mouth daily.  Dispense: 90 tablet; Refill: 1  4. History of thyroidectomy, total  Doing well   5. Obesity (BMI 30-39.9)  Discussed with the patient the risk posed by an increased BMI. Discussed importance of portion control, calorie counting and at least 150 minutes of physical activity weekly.  Avoid sweet beverages and drink more water. Eat at least 6 servings of fruit and vegetables daily   6. Microalbuminuria  Doing well on ARB  7. Carpal tunnel syndrome, unspecified laterality  Doing well on medication   - gabapentin (NEURONTIN) 300 MG capsule; Take 1 capsule (300 mg total)  Three times  daily .  Dispense: 270 capsule; Refill: 1

## 2016-03-14 DIAGNOSIS — E890 Postprocedural hypothyroidism: Secondary | ICD-10-CM | POA: Insufficient documentation

## 2016-04-29 ENCOUNTER — Other Ambulatory Visit: Payer: Self-pay | Admitting: Family Medicine

## 2016-05-02 ENCOUNTER — Other Ambulatory Visit: Payer: Self-pay

## 2016-06-03 ENCOUNTER — Ambulatory Visit (INDEPENDENT_AMBULATORY_CARE_PROVIDER_SITE_OTHER): Payer: BLUE CROSS/BLUE SHIELD | Admitting: Family Medicine

## 2016-06-03 ENCOUNTER — Telehealth: Payer: Self-pay | Admitting: Family Medicine

## 2016-06-03 ENCOUNTER — Encounter: Payer: Self-pay | Admitting: Family Medicine

## 2016-06-03 VITALS — BP 114/74 | HR 74 | Temp 98.1°F | Resp 16 | Wt 237.2 lb

## 2016-06-03 DIAGNOSIS — E0789 Other specified disorders of thyroid: Secondary | ICD-10-CM

## 2016-06-03 DIAGNOSIS — G5600 Carpal tunnel syndrome, unspecified upper limb: Secondary | ICD-10-CM | POA: Diagnosis not present

## 2016-06-03 DIAGNOSIS — E669 Obesity, unspecified: Secondary | ICD-10-CM | POA: Diagnosis not present

## 2016-06-03 DIAGNOSIS — E890 Postprocedural hypothyroidism: Secondary | ICD-10-CM

## 2016-06-03 DIAGNOSIS — Z1239 Encounter for other screening for malignant neoplasm of breast: Secondary | ICD-10-CM

## 2016-06-03 DIAGNOSIS — E1141 Type 2 diabetes mellitus with diabetic mononeuropathy: Secondary | ICD-10-CM

## 2016-06-03 DIAGNOSIS — I10 Essential (primary) hypertension: Secondary | ICD-10-CM

## 2016-06-03 DIAGNOSIS — E785 Hyperlipidemia, unspecified: Secondary | ICD-10-CM

## 2016-06-03 DIAGNOSIS — E1121 Type 2 diabetes mellitus with diabetic nephropathy: Secondary | ICD-10-CM | POA: Diagnosis not present

## 2016-06-03 LAB — POCT GLYCOSYLATED HEMOGLOBIN (HGB A1C): HEMOGLOBIN A1C: 5.6

## 2016-06-03 LAB — POCT UA - MICROALBUMIN: MICROALBUMIN (UR) POC: 6.6 mg/L

## 2016-06-03 MED ORDER — SIMVASTATIN 20 MG PO TABS: ORAL_TABLET | ORAL | Status: DC

## 2016-06-03 NOTE — Telephone Encounter (Signed)
Was seen this morning: patient stated you wanted to know the name of the medication that she was taking and how much Levothyroxin 174mcg tab taking one tablet by mouth daily. Uses [facility]

## 2016-06-03 NOTE — Progress Notes (Signed)
Name: Samantha Copeland   MRN: [id number]    DOB: 09/04/1961   Date:06/03/2016       Progress Note  Subjective  Chief Complaint  Chief Complaint  Patient presents with  . Medication Refill    3 month F/U  . Diabetes  . Dyslipidemia  . Hypertension  . 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  She denies hypoglycemic episodes. Symptoms of carpal tunnel is better, still taking Gabapentin daily, feet numbness has resolved. Continue life style modification. She is following a diabetic diet, eating fish twice weekly but is frustrated about weight gain.. Urine micro is 20 in December and she is on ARB  HTN: bp is at goal, denies side effects of medications. Compliant with medication . No chest pain or SOB or palpitation, or dizziness.   Hyperlipidemia: currently on Simvastatin 20 mg  Discussed ways to increase HDL, such as physical activity, tree nuts and fish  Obesity: changed diet and has been on Trulicity since June [id number], she has gained 7 lbs since last visit, but she is eating healthy and trying to exercise when not raining. Advised to stay on her diet, she had thyroidectomy and may be gaining weight secondary to that.   Goiter: seeing Dr. Richardson Landry had total thyroidectomy in December, doing well, on Synthroid 100 mcg daily and has been released by Dr. Gabriel Carina  Patient Active Problem List   Diagnosis Date Noted  . Hypothyroidism, postop 03/14/2016  . Benign essential HTN 05/30/2015  . Carpal tunnel syndrome 05/30/2015  . Dyslipidemia 05/30/2015  . 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 History of thyroidectomy, total 05/30/2015  . Microalbuminuria 05/30/2015  . Obesity (BMI 30-39.9) 05/30/2015  . Diabetes mellitus with renal manifestation  ([place]) 05/30/2015    Past Surgical History  Procedure Laterality Date  . Abdominal hysterectomy    . Breast biopsy Right     benign  . 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 Fine needle aspiration  10/01/2013    Thyroid-Dr. Gabriel Carina  . Thyroidectomy N/A 12/09/2015    Procedure: THYROIDECTOMY;  Surgeon: Clyde Canterbury, MD;  Location: [facility] ORS;  Service: ENT;  Laterality: N/A;    Family History  Problem Relation Age of Onset  . Diabetes Mother   . Diabetes Father   . Hypertension Father   . Hyperlipidemia Father   . Cancer Father   . Diabetes Sister     Social History   Social History  . Marital Status: Married    Spouse Name: N/A  . Number of Children: N/A  . Years of Education: N/A   Occupational History  . Not on file.   Social History Main Topics  . Smoking status: Never Smoker   . Smokeless tobacco: Never Used  . Alcohol Use: No  . Drug Use: No  . Sexual Activity:    Partners: Male   Other Topics Concern  . Not on file   Social History Narrative     Current outpatient prescriptions:  .  aspirin EC 81 MG tablet, Take by mouth., Disp: , Rfl:  .  calcium-vitamin D (OSCAL WITH D) 500-200 MG-UNIT tablet, Take 1 tablet by mouth 3 (three)  times daily after meals., Disp: 90 tablet, Rfl: 1 .  Cholecalciferol (VITAMIN D-1000 MAX ST) 1000 units tablet, Take by mouth., Disp: , Rfl:  .  Dulaglutide (TRULICITY) 1.5 0000000 SOPN, Inject 1.5 mg into the skin once a week. INJECT 1.5MG  SUBCUTANEOUSLY ONCE A WEEK (SUNDAY), Disp: 4 pen, Rfl: 5 .  gabapentin (NEURONTIN) 300 MG capsule, Take 1 capsule (300 mg total) by mouth at bedtime., Disp: 270 capsule, Rfl: 1 .  glucose blood test strip, , Disp: , Rfl:  .  irbesartan-hydrochlorothiazide (AVALIDE) 300-12.5 MG tablet, Take 1 tablet by mouth daily., Disp: 90 tablet, Rfl: 1 .  levothyroxine (SYNTHROID, LEVOTHROID) 100 MCG tablet, , Disp: , Rfl:  .  simvastatin (ZOCOR) 20 MG tablet, TAKE ONE  TABLET BY MOUTH ONCE DAILY AT 6PM, Disp: 90 tablet, Rfl: 1  No Known Allergies   ROS  Constitutional: Negative for fever, positive for  weight change.  Respiratory: Negative for cough and shortness of breath.   Cardiovascular: Negative for chest pain or palpitations.  Gastrointestinal: Negative for abdominal pain, no bowel changes.  Musculoskeletal: Negative for gait problem or joint swelling.  Skin: Negative for rash.  Neurological: Negative for dizziness or headache.  No other specific complaints in a complete review of systems (except as listed in HPI above).   Objective  Filed Vitals:   06/03/16 0905  BP: 114/74  Pulse: 74  Temp: 98.1 F (36.7 C)  TempSrc: Oral  Resp: 16  Weight: 237 lb 3.2 oz (107.593 kg)  SpO2: 97%    Body mass index is 38.3 kg/(m^2).  Physical Exam  Constitutional: Patient appears well-developed and well-nourished. Obese No distress.  HEENT: head atraumatic, normocephalic, pupils equal and reactive to light, neck supple, surgical site in good aspect, throat within normal limits Cardiovascular: Normal rate, regular rhythm and normal heart sounds. No murmur heard. No BLE edema. Pulmonary/Chest: [place] normal and breath sounds normal. No respiratory distress. Abdominal: Soft. There is no tenderness. Psychiatric: Patient has a normal mood and affect. behavior is normal. Judgment and thought content normal Muscular Skeletal: partial amputation of right index finger  Recent Results (from the past 2160 hour(s))  POCT UA - Microalbumin     Status: Abnormal   Collection Time: 06/03/16  9:20 AM  Result Value Ref Range   Microalbumin Ur, POC 6.6 mg/L   Creatinine, POC  mg/dL   Albumin/Creatinine Ratio, Urine, POC    POCT HgB A1C     Status: None   Collection Time: 06/03/16  9:29 AM  Result Value Ref Range   Hemoglobin A1C 5.6     Diabetic Foot Exam: Diabetic Foot Exam - Simple   Simple Foot Form  Diabetic Foot exam was performed with the  following findings:  Yes 06/03/2016  9:30 AM  Visual Inspection  No deformities, no ulcerations, no other skin breakdown bilaterally:  Yes  Sensation Testing  Intact to touch and monofilament testing bilaterally:  Yes  Pulse Check  Posterior Tibialis and Dorsalis pulse intact bilaterally:  Yes  Comments       PHQ2/9: Depression screen [facility] 2/9 06/03/2016 03/03/2016 12/03/2015 09/02/2015 06/01/2015  Decreased Interest 0 0 0 0 0  Down, Depressed, Hopeless 0 0 0 0 0  PHQ - 2 Score 0 0 0 0 0     Fall Risk: Fall Risk  06/03/2016 03/03/2016 12/03/2015 09/02/2015 06/01/2015  Falls in the past year? No No No No No      Functional Status Survey: Is the patient deaf or have  difficulty hearing?: No Does the patient have difficulty seeing, even when wearing glasses/contacts?: Yes Does the patient have difficulty concentrating, remembering, or making decisions?: No Does the patient have difficulty walking or climbing stairs?: No Does the patient have difficulty dressing or bathing?: No Does the patient have difficulty doing errands alone such as visiting a doctor's office or shopping?: No    Assessment & Plan  1. Type 2 diabetes mellitus with diabetic nephropathy, without long-term current use of insulin (HCC)  - POCT UA - Microalbumin - POCT HgB A1C - simvastatin (ZOCOR) 20 MG tablet; TAKE ONE TABLET BY MOUTH ONCE DAILY AT 6PM  Dispense: 90 tablet; Refill: 1 - Comprehensive metabolic panel  2. 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 History of thyroidectomy, total  - TSH  4. Benign essential HTN  - Comprehensive metabolic panel  5. Dyslipidemia  - Lipid panel  6. Obesity (BMI 30-39.9)  Discussed with the patient the risk posed by an increased BMI. Discussed importance of portion control, calorie counting and at least 150  minutes of physical activity weekly. Avoid sweet beverages and drink more water. Eat at least 6 servings of fruit and vegetables daily  Discussed adding Jardiance or Farxiga, but she would like to hold off for now  7. Breast cancer screening  - MM Digital Screening; Future

## 2016-06-06 ENCOUNTER — Other Ambulatory Visit: Payer: Self-pay | Admitting: Family Medicine

## 2016-06-07 LAB — COMPREHENSIVE METABOLIC PANEL WITH GFR
ALT: 17 IU/L (ref 0–32)
AST: 21 IU/L (ref 0–40)
Albumin/Globulin Ratio: 1.2 (ref 1.2–2.2)
Albumin: 4.3 g/dL (ref 3.5–5.5)
Alkaline Phosphatase: 81 IU/L (ref 39–117)
BUN/Creatinine Ratio: 12 (ref 9–23)
BUN: 13 mg/dL (ref 6–24)
Bilirubin Total: 0.5 mg/dL (ref 0.0–1.2)
CO2: 28 mmol/L (ref 18–29)
Calcium: 9.1 mg/dL (ref 8.7–10.2)
Chloride: 103 mmol/L (ref 96–106)
Creatinine, Ser: 1.09 mg/dL — ABNORMAL HIGH (ref 0.57–1.00)
GFR calc Af Amer: 67 mL/min/1.73
GFR calc non Af Amer: 58 mL/min/1.73 — ABNORMAL LOW
Globulin, Total: 3.5 g/dL (ref 1.5–4.5)
Glucose: 87 mg/dL (ref 65–99)
Potassium: 3.9 mmol/L (ref 3.5–5.2)
Sodium: 144 mmol/L (ref 134–144)
Total Protein: 7.8 g/dL (ref 6.0–8.5)

## 2016-06-07 LAB — LIPID PANEL
Chol/HDL Ratio: 1.5 ratio (ref 0.0–4.4)
Cholesterol, Total: 146 mg/dL (ref 100–199)
HDL: 99 mg/dL
LDL Calculated: 38 mg/dL (ref 0–99)
Triglycerides: 43 mg/dL (ref 0–149)
VLDL Cholesterol Cal: 9 mg/dL (ref 5–40)

## 2016-06-07 LAB — TSH: TSH: 1.47 u[IU]/mL (ref 0.450–4.500)

## 2016-06-24 ENCOUNTER — Ambulatory Visit
Admission: RE | Admit: 2016-06-24 | Discharge: 2016-06-24 | Disposition: A | Discharge status: Home / Self Care | Payer: BLUE CROSS/BLUE SHIELD | Source: Ambulatory Visit | Attending: Family Medicine | Admitting: Family Medicine

## 2016-06-24 ENCOUNTER — Other Ambulatory Visit: Payer: Self-pay | Admitting: Family Medicine

## 2016-06-24 DIAGNOSIS — Z1239 Encounter for other screening for malignant neoplasm of breast: Secondary | ICD-10-CM

## 2016-06-24 DIAGNOSIS — Z1231 Encounter for screening mammogram for malignant neoplasm of breast: Secondary | ICD-10-CM | POA: Insufficient documentation

## 2016-07-13 ENCOUNTER — Other Ambulatory Visit: Payer: Self-pay

## 2016-07-13 MED ORDER — LEVOTHYROXINE SODIUM 100 MCG PO TABS: 100.0000 ug | ORAL_TABLET | Freq: Every day | ORAL | 2 refills | Status: DC

## 2016-07-13 NOTE — Telephone Encounter (Signed)
Patient states she was just seen on 06/06/16 and had blood work but her Thyroid medication was never sent to [facility] on [address]. Can you please send her medication in today, she is on her last pill today. Thanks

## 2016-08-02 ENCOUNTER — Telehealth: Payer: Self-pay | Admitting: Family Medicine

## 2016-08-02 NOTE — Telephone Encounter (Signed)
p 

## 2016-08-02 NOTE — Telephone Encounter (Signed)
She can take meloxicam, but should not take it long term ( more than a couple of weeks ) because of kidney function Monitor glucose at home and avoid eating any sweets/starches this week

## 2016-08-02 NOTE — Telephone Encounter (Signed)
Pt informed and understands instructions

## 2016-08-03 ENCOUNTER — Encounter: Payer: Self-pay | Admitting: Family Medicine

## 2016-08-03 ENCOUNTER — Ambulatory Visit (INDEPENDENT_AMBULATORY_CARE_PROVIDER_SITE_OTHER): Payer: BLUE CROSS/BLUE SHIELD | Admitting: Family Medicine

## 2016-08-03 VITALS — BP 118/78 | HR 76 | Temp 98.2°F | Resp 18 | Ht 66.0 in | Wt 236.4 lb

## 2016-08-03 DIAGNOSIS — M659 Synovitis and tenosynovitis, unspecified: Secondary | ICD-10-CM | POA: Insufficient documentation

## 2016-08-03 DIAGNOSIS — Z0000 Encounter for general adult medical examination without abnormal findings: Secondary | ICD-10-CM

## 2016-08-03 DIAGNOSIS — Z01419 Encounter for gynecological examination (general) (routine) without abnormal findings: Secondary | ICD-10-CM

## 2016-08-03 NOTE — Progress Notes (Signed)
Name: Samantha Copeland   MRN: [id number]    DOB: 1961-01-03   Date:08/03/2016       Progress Note  Subjective  Chief Complaint  Chief Complaint  Patient presents with  . Annual Exam    HPI  Well Woman: she is married, no discomfort during intercourse, no bladder or breast  Problems. Seen by Ortho and had steroid injection yesterday on left thumb, and is wearing a brace, otherwise feeling well. Discussed previous labs and also mammogram  Patient Active Problem List   Diagnosis Date Noted  . Hypothyroidism, postop 03/14/2016  . Benign essential HTN 05/30/2015  . Carpal tunnel syndrome 05/30/2015  . Dyslipidemia 05/30/2015  . 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 History of thyroidectomy, total 05/30/2015  . Microalbuminuria 05/30/2015  . Obesity (BMI 30-39.9) 05/30/2015  . Diabetes mellitus with renal manifestation ([place]) 05/30/2015    Past Surgical History:  Procedure Laterality Date  . ABDOMINAL HYSTERECTOMY    . 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 BREAST BIOPSY Right    benign  . FINE NEEDLE ASPIRATION  10/01/2013   Thyroid-Dr. Gabriel Carina  . THYROIDECTOMY N/A 12/09/2015   Procedure: THYROIDECTOMY;  Surgeon: Clyde Canterbury, MD;  Location: [facility] ORS;  Service: ENT;  Laterality: N/A;    Family History  Problem Relation Age of Onset  . Diabetes Mother   . Diabetes Father   . Hypertension Father   . Hyperlipidemia Father   . Cancer Father   . Diabetes Sister     Social History   Social History  . Marital status: Married    Spouse name: N/A  . Number of children: N/A  . Years of education: N/A   Occupational History  . Not on file.   Social History Main Topics  . Smoking status: Never Smoker  . Smokeless tobacco: Never Used  . Alcohol use No  . Drug use: No  . Sexual activity: Yes    Partners: Male   Other Topics Concern  . Not on file   Social History Narrative  . No narrative on  file     Current Outpatient Prescriptions:  .  meloxicam (MOBIC) 7.5 MG tablet, Take 7.5 mg by mouth daily., Disp: , Rfl:  .  aspirin EC 81 MG tablet, Take by mouth., Disp: , Rfl:  .  calcium-vitamin D (OSCAL WITH D) 500-200 MG-UNIT tablet, Take 1 tablet by mouth 3 (three) times daily after meals., Disp: 90 tablet, Rfl: 1 .  Cholecalciferol (VITAMIN D-1000 MAX ST) 1000 units tablet, Take by mouth., Disp: , Rfl:  .  Dulaglutide (TRULICITY) 1.5 0000000 SOPN, Inject 1.5 mg into the skin once a week. INJECT 1.5MG  SUBCUTANEOUSLY ONCE A WEEK (SUNDAY), Disp: 4 pen, Rfl: 5 .  gabapentin (NEURONTIN) 300 MG capsule, Take 1 capsule (300 mg total) by mouth at bedtime., Disp: 270 capsule, Rfl: 1 .  glucose blood test strip, , Disp: , Rfl:  .  irbesartan-hydrochlorothiazide (AVALIDE) 300-12.5 MG tablet, Take 1 tablet by mouth daily., Disp: 90 tablet, Rfl: 1 .  levothyroxine (SYNTHROID, LEVOTHROID) 100 MCG tablet, Take 1 tablet (100 mcg total) by mouth daily before breakfast., Disp: 30 tablet, Rfl: 2 .  simvastatin (ZOCOR) 20 MG tablet, TAKE ONE TABLET BY MOUTH ONCE DAILY AT 6PM, Disp: 90 tablet, Rfl: 1  No Known Allergies   ROS  Constitutional: Negative for fever or weight change.  Respiratory: Negative for cough and shortness of  breath.   Cardiovascular: Negative for chest pain or palpitations.  Gastrointestinal: Negative for abdominal pain, no bowel changes.  Musculoskeletal: Negative for gait problem , positive for  joint swelling ( improved with shot ).  Skin: Negative for rash.  Neurological: Negative for dizziness or headache.  No other specific complaints in a complete review of systems (except as listed in HPI above).  Objective  Vitals:   08/03/16 0834  BP: 118/78  Pulse: 76  Resp: 18  Temp: 98.2 F (36.8 C)  SpO2: 98%  Weight: 236 lb 6 oz (107.2 kg)  Height: 5\' 6"  (1.676 m)    Body mass index is 38.15 kg/m.  Physical Exam  Constitutional: Patient appears well-developed  and well-nourished. No distress.  HENT: Head: Normocephalic and atraumatic. Ears: B TMs ok, no erythema or effusion; Nose: Nose normal. Mouth/Throat: Oropharynx is clear and moist. No oropharyngeal exudate.  Eyes: Conjunctivae and EOM are normal. Pupils are equal, round, and reactive to light. No scleral icterus.  Neck: Normal range of motion. Neck supple. No JVD present. No thyromegaly present.  Cardiovascular: Normal rate, regular rhythm and normal heart sounds.  No murmur heard. No BLE edema. Pulmonary/Chest: Effort normal and breath sounds normal. No respiratory distress. Abdominal: Soft. Bowel sounds are normal, no distension. There is no tenderness. no masses Breast: no lumps or masses, no nipple discharge or rashes FEMALE GENITALIA:  External genitalia normal External urethra normal Pelvic not done RECTAL: not done Musculoskeletal: Normal range of motion, no joint effusions. No gross deformities Neurological: he is alert and oriented to person, place, and time. No cranial nerve deficit. Coordination, balance, strength, speech and gait are normal.  Skin: Skin is warm and dry. No rash noted. No erythema.  Psychiatric: Patient has a normal mood and affect. behavior is normal. Judgment and thought content normal.  Recent Results (from the past 2160 hour(s))  POCT UA - Microalbumin     Status: Abnormal   Collection Time: 06/03/16  9:20 AM  Result Value Ref Range   Microalbumin Ur, POC 6.6 mg/L   Creatinine, POC  mg/dL   Albumin/Creatinine Ratio, Urine, POC    POCT HgB A1C     Status: None   Collection Time: 06/03/16  9:29 AM  Result Value Ref Range   Hemoglobin A1C 5.6   Lipid panel     Status: None   Collection Time: 06/06/16 10:00 AM  Result Value Ref Range   Cholesterol, Total 146 100 - 199 mg/dL   Triglycerides 43 0 - 149 mg/dL   HDL 99 >39 mg/dL   VLDL Cholesterol Cal 9 5 - 40 mg/dL   LDL Calculated 38 0 - 99 mg/dL   Chol/HDL Ratio 1.5 0.0 - 4.4 ratio units    Comment:                                    T. Chol/HDL Ratio                                             Men  Women                               1/2 Avg.Risk  3.4    3.3  Avg.Risk  5.0    4.4                                2X Avg.Risk  9.6    7.1                                3X Avg.Risk 23.4   11.0   TSH     Status: None   Collection Time: 06/06/16 10:00 AM  Result Value Ref Range   TSH 1.470 0.450 - 4.500 uIU/mL  Comprehensive metabolic panel     Status: Abnormal   Collection Time: 06/06/16 10:05 AM  Result Value Ref Range   Glucose 87 65 - 99 mg/dL   BUN 13 6 - 24 mg/dL   Creatinine, Ser 1.09 (H) 0.57 - 1.00 mg/dL   GFR calc non Af Amer 58 (L) >59 mL/min/1.73   GFR calc Af Amer 67 >59 mL/min/1.73   BUN/Creatinine Ratio 12 9 - 23   Sodium 144 134 - 144 mmol/L   Potassium 3.9 3.5 - 5.2 mmol/L   Chloride 103 96 - 106 mmol/L   CO2 28 18 - 29 mmol/L   Calcium 9.1 8.7 - 10.2 mg/dL   Total Protein 7.8 6.0 - 8.5 g/dL   Albumin 4.3 3.5 - 5.5 g/dL   Globulin, Total 3.5 1.5 - 4.5 g/dL   Albumin/Globulin Ratio 1.2 1.2 - 2.2   Bilirubin Total 0.5 0.0 - 1.2 mg/dL   Alkaline Phosphatase 81 39 - 117 IU/L   AST 21 0 - 40 IU/L   ALT 17 0 - 32 IU/L     PHQ2/9: Depression screen [facility] 2/9 08/03/2016 06/03/2016 03/03/2016 12/03/2015 09/02/2015  Decreased Interest 0 0 0 0 0  Down, Depressed, Hopeless 0 0 0 0 0  PHQ - 2 Score 0 0 0 0 0     Fall Risk: Fall Risk  08/03/2016 06/03/2016 03/03/2016 12/03/2015 09/02/2015  Falls in the past year? No No No No No     Functional Status Survey: Is the patient deaf or have difficulty hearing?: No Does the patient have difficulty seeing, even when wearing glasses/contacts?: No Does the patient have difficulty concentrating, remembering, or making decisions?: No Does the patient have difficulty walking or climbing stairs?: No Does the patient have difficulty dressing or bathing?: No Does the patient have difficulty doing errands  alone such as visiting a doctor's office or shopping?: No    Assessment & Plan  1. Well woman exam  Discussed importance of 150 minutes of physical activity weekly, eat two servings of fish weekly, eat one serving of tree nuts ( cashews, pistachios, pecans, almonds.Marland Kitchen) every other day, eat 6 servings of fruit/vegetables daily and drink plenty of water and avoid sweet beverages.

## 2016-09-05 ENCOUNTER — Ambulatory Visit (INDEPENDENT_AMBULATORY_CARE_PROVIDER_SITE_OTHER): Payer: BLUE CROSS/BLUE SHIELD | Admitting: Family Medicine

## 2016-09-05 ENCOUNTER — Encounter: Payer: Self-pay | Admitting: Family Medicine

## 2016-09-05 VITALS — BP 112/62 | HR 71 | Temp 98.0°F | Resp 18 | Ht 66.0 in | Wt 235.4 lb

## 2016-09-05 DIAGNOSIS — E0789 Other specified disorders of thyroid: Secondary | ICD-10-CM

## 2016-09-05 DIAGNOSIS — E1141 Type 2 diabetes mellitus with diabetic mononeuropathy: Secondary | ICD-10-CM | POA: Diagnosis not present

## 2016-09-05 DIAGNOSIS — I10 Essential (primary) hypertension: Secondary | ICD-10-CM | POA: Diagnosis not present

## 2016-09-05 DIAGNOSIS — E785 Hyperlipidemia, unspecified: Secondary | ICD-10-CM | POA: Diagnosis not present

## 2016-09-05 DIAGNOSIS — Z23 Encounter for immunization: Secondary | ICD-10-CM | POA: Diagnosis not present

## 2016-09-05 DIAGNOSIS — E1129 Type 2 diabetes mellitus with other diabetic kidney complication: Secondary | ICD-10-CM | POA: Diagnosis not present

## 2016-09-05 DIAGNOSIS — M778 Other enthesopathies, not elsewhere classified: Secondary | ICD-10-CM | POA: Diagnosis not present

## 2016-09-05 DIAGNOSIS — E1121 Type 2 diabetes mellitus with diabetic nephropathy: Secondary | ICD-10-CM

## 2016-09-05 DIAGNOSIS — R809 Proteinuria, unspecified: Secondary | ICD-10-CM | POA: Diagnosis not present

## 2016-09-05 DIAGNOSIS — R194 Change in bowel habit: Secondary | ICD-10-CM

## 2016-09-05 DIAGNOSIS — G5600 Carpal tunnel syndrome, unspecified upper limb: Secondary | ICD-10-CM | POA: Diagnosis not present

## 2016-09-05 DIAGNOSIS — E890 Postprocedural hypothyroidism: Secondary | ICD-10-CM

## 2016-09-05 DIAGNOSIS — R198 Other specified symptoms and signs involving the digestive system and abdomen: Secondary | ICD-10-CM

## 2016-09-05 LAB — POCT UA - MICROALBUMIN: MICROALBUMIN (UR) POC: 20 mg/L

## 2016-09-05 LAB — TSH: TSH: 1.14 mIU/L

## 2016-09-05 LAB — POCT GLYCOSYLATED HEMOGLOBIN (HGB A1C): HEMOGLOBIN A1C: 6.8

## 2016-09-05 MED ORDER — ATORVASTATIN CALCIUM 40 MG PO TABS: 40.0000 mg | ORAL_TABLET | Freq: Every day | ORAL | 1 refills | Status: DC

## 2016-09-05 MED ORDER — DULAGLUTIDE 1.5 MG/0.5ML ~~LOC~~ SOAJ: 1.5000 mg | SUBCUTANEOUS | 5 refills | Status: DC

## 2016-09-05 MED ORDER — LEVOTHYROXINE SODIUM 100 MCG PO TABS: 100.0000 ug | ORAL_TABLET | Freq: Every day | ORAL | 1 refills | Status: DC

## 2016-09-05 NOTE — Progress Notes (Signed)
Name: Samantha Copeland   MRN: [id number]    DOB: 1961-08-20   Date:09/05/2016       Progress Note  Subjective  Chief Complaint  Chief Complaint  Patient presents with  . Diabetes    pt here for 3 month follow up BS checked 1 per day low 77 avg 120 high 152  . Hyperlipidemia  . Hypertension  . Thyroid Problem  . 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  She denies hypoglycemic episodes. Symptoms of carpal tunnel is better and feet tingling has resolved, still taking Gabapentin daily but willing to try to stop it, advised to wean off medication . Continue life style modification. She is following a diabetic diet, eating fish twice weekly.  Urine micro normal today and she is on ARB  HTN: bp is at goal, denies side effects of medications. Compliant with medication . No chest pain or SOB or palpitation, or dizziness.   Hyperlipidemia: currently on Simvastatin 20 mg and Lipitor 40 mg, she did not stop Simvastatin as recommended. Explained that Lipitor is a replacement for the Simvastatin. Discussed ways to increase HDL, such as physical activity, tree nuts and fish during her last visit and she has been following the recommendation   Obesity: changed diet and has been on Trulicity since June [id number], she has lost 1 lbs since last visit,  but she is eating healthy and trying to exercise more often. Advised to stay on her diet.  Goiter: seeing Dr. Richardson Landry had total thyroidectomy in December 2016, doing well, on Synthroid 100 mcg daily and has been released by Dr. Gabriel Carina. No tachycardia, she has noticed some constipation over the past 6 weeks - bowel movements about once or twice a week  Left wrist tendinitis; seen at [place], given a brace, took Meloxicam, and had a steroid injection in August. Doing better, but still needs to  keep brace on to control symptoms.   Patient Active Problem List   Diagnosis Date Noted  . Tenosynovitis of thumb 08/03/2016  . Hypothyroidism, postop 03/14/2016  . Benign essential HTN 05/30/2015  . Carpal tunnel syndrome 05/30/2015  . Dyslipidemia 05/30/2015  . 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 History of thyroidectomy, total 05/30/2015  . Microalbuminuria 05/30/2015  . Obesity (BMI 30-39.9) 05/30/2015  . Diabetes mellitus with renal manifestation ([place]) 05/30/2015    Past Surgical History:  Procedure Laterality Date  . ABDOMINAL HYSTERECTOMY    . 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 BREAST BIOPSY Right    benign  . FINE NEEDLE ASPIRATION  10/01/2013   Thyroid-Dr. Gabriel Carina  . THYROIDECTOMY N/A 12/09/2015   Procedure: THYROIDECTOMY;  Surgeon: Clyde Canterbury, MD;  Location: [facility] ORS;  Service: ENT;  Laterality: N/A;    Family History  Problem Relation Age of Onset  . Diabetes Mother   . Diabetes Father   . Hypertension Father   . Hyperlipidemia Father   . Cancer Father   . Diabetes Sister     Social History   Social History  . Marital status: Married    Spouse name: Orpah Greek  . Number of children: 3  . Years of education: N/A   Occupational History  . 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 Smoking status: Never Smoker  . Smokeless tobacco: Never Used  . Alcohol use No  . Drug use: No  . Sexual activity: Yes    Partners: Male   Other Topics Concern  . Not on file   Social History Narrative   Lives with husband, youngest daughter and her grandson     Current Outpatient Prescriptions:  .  aspirin EC 81 MG tablet, Take by mouth., Disp: , Rfl:  .  atorvastatin (LIPITOR) 40 MG tablet, Take 1 tablet (40 mg total) by mouth daily., Disp: 90 tablet, Rfl: 1 .  Dulaglutide (TRULICITY) 1.5 0000000 SOPN, Inject 1.5 mg into the skin once a week. INJECT 1.5MG  SUBCUTANEOUSLY  ONCE A WEEK (SUNDAY), Disp: 4 pen, Rfl: 5 .  gabapentin (NEURONTIN) 300 MG capsule, Take 1 capsule (300 mg total) by mouth at bedtime., Disp: 270 capsule, Rfl: 1 .  glucose blood test strip, , Disp: , Rfl:  .  irbesartan-hydrochlorothiazide (AVALIDE) 300-12.5 MG tablet, Take 1 tablet by mouth daily., Disp: 90 tablet, Rfl: 1 .  levothyroxine (SYNTHROID, LEVOTHROID) 100 MCG tablet, Take 1 tablet (100 mcg total) by mouth daily before breakfast., Disp: 90 tablet, Rfl: 1 .  calcium-vitamin D (OSCAL WITH D) 500-200 MG-UNIT tablet, Take 1 tablet by mouth 3 (three) times daily after meals., Disp: 90 tablet, Rfl: 1 .  Cholecalciferol (VITAMIN D-1000 MAX ST) 1000 units tablet, Take by mouth., Disp: , Rfl:   No Known Allergies   ROS  Constitutional: Negative for fever or weight change.  Respiratory: Negative for cough and shortness of breath.   Cardiovascular: Negative for chest pain or palpitations.  Gastrointestinal: Negative for abdominal pain, no bowel changes.  Musculoskeletal: Negative for gait problem or joint swelling.  Skin: Negative for rash.  Neurological: Negative for dizziness or headache.  No other specific complaints in a complete review of systems (except as listed in HPI above).  Objective  Vitals:   09/05/16 0903  BP: 112/62  Pulse: 71  Resp: 18  Temp: 98 F (36.7 C)  SpO2: 98%  Weight: 235 lb 6 oz (106.8 kg)  Height: 5\' 6"  (1.676 m)    Body mass index is 37.99 kg/m.  Physical Exam  Constitutional: Patient appears well-developed and well-nourished. Obese  No distress.  HEENT: head atraumatic, normocephalic, pupils equal and reactive to light,  neck supple, throat within normal limits Cardiovascular: Normal rate, regular rhythm and normal heart sounds.  No murmur heard. No BLE edema. Pulmonary/Chest: Effort normal and breath sounds normal. No respiratory distress. Abdominal: Soft.  There is no tenderness. Psychiatric: Patient has a normal mood and affect. behavior  is normal. Judgment and thought content normal.  Recent Results (from the past 2160 hour(s))  POCT HgB A1C     Status: None   Collection Time: 09/05/16  9:04 AM  Result Value Ref Range   Hemoglobin A1C 6.8   POCT UA - Microalbumin     Status: None   Collection Time: 09/05/16  9:05 AM  Result Value Ref Range   Microalbumin Ur, POC 20 mg/L   Creatinine, POC  mg/dL   Albumin/Creatinine Ratio, Urine, POC       PHQ2/9: Depression screen [facility] 2/9 09/05/2016 08/03/2016 06/03/2016 03/03/2016 12/03/2015  Decreased Interest 0 0 0 0 0  Down, Depressed, Hopeless 0 0 0 0 0  PHQ - 2 Score 0 0 0 0 0    Fall Risk: Fall Risk  09/05/2016 08/03/2016 06/03/2016 03/03/2016 12/03/2015  Falls  in the past year? No No No No No     Functional Status Survey: Is the patient deaf or have difficulty hearing?: No Does the patient have difficulty seeing, even when wearing glasses/contacts?: No Does the patient have difficulty concentrating, remembering, or making decisions?: No Does the patient have difficulty walking or climbing stairs?: No Does the patient have difficulty dressing or bathing?: No Does the patient have difficulty doing errands alone such as visiting a doctor's office or shopping?: No    Assessment & Plan  1. 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 INJECT 1.5MG  SUBCUTANEOUSLY ONCE A WEEK (SUNDAY)  Dispense: 4 pen; Refill: 5  2. Type 2 diabetes mellitus with diabetic nephropathy, without long-term current use of insulin (HCC)  - Dulaglutide (TRULICITY) 1.5 0000000 SOPN; Inject 1.5 mg into the skin once a week. INJECT 1.5MG  SUBCUTANEOUSLY ONCE A WEEK (SUNDAY)  Dispense: 4 pen; Refill: 5  3. Benign essential HTN  Well controlled   4. History of thyroidectomy, total  - levothyroxine (SYNTHROID, LEVOTHROID) 100 MCG tablet; Take 1 tablet (100  mcg total) by mouth daily before breakfast.  Dispense: 90 tablet; Refill: 1 - TSH  5. Dyslipidemia  - atorvastatin (LIPITOR) 40 MG tablet; Take 1 tablet (40 mg total) by mouth daily.  Dispense: 90 tablet; Refill: 1  6. Type 2 diabetes mellitus with microalbuminuria, without long-term current use of insulin (HCC)  - Dulaglutide (TRULICITY) 1.5 0000000 SOPN; Inject 1.5 mg into the skin once a week. INJECT 1.5MG  SUBCUTANEOUSLY ONCE A WEEK (SUNDAY)  Dispense: 4 pen; Refill: 5  7. Left wrist tendinitis   8. Change in bowel movement  We will check TSH, she states never had constipation but over the past two months bowel movements changed to about once or twice weekly and she needs to strain, if TSH if normal advised repeat colonoscopy.  - TSH   9. Needs flu shot  - Flu Vaccine QUAD 36+ mos IM

## 2016-11-23 ENCOUNTER — Ambulatory Visit (INDEPENDENT_AMBULATORY_CARE_PROVIDER_SITE_OTHER): Payer: BLUE CROSS/BLUE SHIELD | Admitting: Family Medicine

## 2016-11-23 ENCOUNTER — Encounter: Payer: Self-pay | Admitting: Family Medicine

## 2016-11-23 VITALS — BP 120/80 | HR 63 | Temp 97.7°F | Resp 16 | Ht 66.0 in | Wt 237.0 lb

## 2016-11-23 DIAGNOSIS — E785 Hyperlipidemia, unspecified: Secondary | ICD-10-CM

## 2016-11-23 DIAGNOSIS — I10 Essential (primary) hypertension: Secondary | ICD-10-CM

## 2016-11-23 DIAGNOSIS — G5600 Carpal tunnel syndrome, unspecified upper limb: Secondary | ICD-10-CM | POA: Diagnosis not present

## 2016-11-23 DIAGNOSIS — M778 Other enthesopathies, not elsewhere classified: Secondary | ICD-10-CM

## 2016-11-23 DIAGNOSIS — E669 Obesity, unspecified: Secondary | ICD-10-CM | POA: Diagnosis not present

## 2016-11-23 DIAGNOSIS — E890 Postprocedural hypothyroidism: Secondary | ICD-10-CM | POA: Diagnosis not present

## 2016-11-23 DIAGNOSIS — E1121 Type 2 diabetes mellitus with diabetic nephropathy: Secondary | ICD-10-CM

## 2016-11-23 DIAGNOSIS — E1141 Type 2 diabetes mellitus with diabetic mononeuropathy: Secondary | ICD-10-CM | POA: Diagnosis not present

## 2016-11-23 MED ORDER — GABAPENTIN 300 MG PO CAPS: 300.0000 mg | ORAL_CAPSULE | Freq: Every day | ORAL | 1 refills | Status: DC

## 2016-11-23 MED ORDER — IRBESARTAN-HYDROCHLOROTHIAZIDE 300-12.5 MG PO TABS: 1.0000 | ORAL_TABLET | Freq: Every day | ORAL | 1 refills | Status: DC

## 2016-11-23 NOTE — Progress Notes (Signed)
Name: Samantha Copeland   MRN: [id number]    DOB: [birth year]   Date:11/23/2016       Progress Note  Subjective  Chief Complaint  Chief Complaint  Patient presents with  . Diabetes    Blood sugars have been normal pt watching her diet  . Thyroid Problem  . 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 She denies hypoglycemic episodes. Symptoms of carpal tunnel is better and feet tingling has resolved, still taking Gabapentin daily but down to one pill daily.  Continue life style modification. She is following a diabetic diet, eating fish twice weekly.  Urine micro normal, she is on ARB. It is too early to check hgbA1C, so she will return the week of Christmas just for nurse visit, and hgbA1C   HTN: bp is at goal, denies side effects of medications. Compliant with medication . No chest pain or SOB or palpitation, or dizziness.   Hyperlipidemia: she is on Atorvastatin daily.  Discussed ways to increase HDL, such as physical activity, tree nuts and fish during her last visit and she has been following the recommendation   Obesity: changed diet and has been on Trulicity since June [id number], weight has been stable,  but she is eating healthy but has not been able to exercise lately because of her job. Advised to stay on her diet.  Goiter: seeing Dr. Richardson Landry had total thyroidectomy in December 2016, doing well, on Synthroid 100 mcg daily and has been released by Dr. Gabriel Carina. No tachycardia, constipation has resolved with dietary modification. Last TSH was at goal  Left wrist tendinitis; seen at [facility], given a brace, took Meloxicam, and had a steroid injection in August. Symptoms are getting worse again.   She is getting frustrated, pain is worse, wearing a brace would like to see another provider for further evaluation.    Patient  Active Problem List   Diagnosis Date Noted  . Tenosynovitis of thumb 08/03/2016  . Hypothyroidism, postop 03/14/2016  . Benign essential HTN 05/30/2015  . Carpal tunnel syndrome 05/30/2015  . Dyslipidemia 05/30/2015  . 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 History of thyroidectomy, total 05/30/2015  . Microalbuminuria 05/30/2015  . Obesity (BMI 30-39.9) 05/30/2015  . Diabetes mellitus with renal manifestation ([place]) 05/30/2015    Past Surgical History:  Procedure Laterality Date  . ABDOMINAL HYSTERECTOMY    . 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 BREAST BIOPSY Right    benign  . FINE NEEDLE ASPIRATION  10/01/2013   Thyroid-Dr. Gabriel Carina  . THYROIDECTOMY N/A 12/09/2015   Procedure: THYROIDECTOMY;  Surgeon: Clyde Canterbury, MD;  Location: [facility] ORS;  Service: ENT;  Laterality: N/A;    Family History  Problem Relation Age of Onset  . Diabetes Mother   . Diabetes Father   . Hypertension Father   . Hyperlipidemia Father   . Cancer Father   . Diabetes Sister     Social History   Social History  . Marital status: Married    Spouse name: Orpah Greek  . Number of children: 3  . Years of education: N/A   Occupational History  . 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 Smoking status: Never Smoker  .  Smokeless tobacco: Never Used  . Alcohol use No  . Drug use: No  . Sexual activity: Yes    Partners: Male   Other Topics Concern  . Not on file   Social History Narrative   Lives with husband, youngest daughter and her grandson     Current Outpatient Prescriptions:  .  aspirin EC 81 MG tablet, Take by mouth., Disp: , Rfl:  .  atorvastatin (LIPITOR) 40 MG tablet, Take 1 tablet (40 mg total) by mouth daily., Disp: 90 tablet, Rfl: 1 .  calcium-vitamin D (OSCAL WITH D) 500-200 MG-UNIT tablet, Take 1 tablet by mouth 3 (three) times daily after meals., Disp: 90 tablet, Rfl: 1 .  Cholecalciferol  (VITAMIN D-1000 MAX ST) 1000 units tablet, Take by mouth., Disp: , Rfl:  .  Dulaglutide (TRULICITY) 1.5 0000000 SOPN, Inject 1.5 mg into the skin once a week. INJECT 1.5MG  SUBCUTANEOUSLY ONCE A WEEK (SUNDAY), Disp: 4 pen, Rfl: 5 .  gabapentin (NEURONTIN) 300 MG capsule, Take 1 capsule (300 mg total) by mouth at bedtime., Disp: 270 capsule, Rfl: 1 .  glucose blood test strip, , Disp: , Rfl:  .  irbesartan-hydrochlorothiazide (AVALIDE) 300-12.5 MG tablet, Take 1 tablet by mouth daily., Disp: 90 tablet, Rfl: 1 .  levothyroxine (SYNTHROID, LEVOTHROID) 100 MCG tablet, Take 1 tablet (100 mcg total) by mouth daily before breakfast., Disp: 90 tablet, Rfl: 1  No Known Allergies   ROS  Constitutional: Negative for fever or weight change.  Respiratory: Negative for cough and shortness of breath.   Cardiovascular: Negative for chest pain or palpitations.  Gastrointestinal: Negative for abdominal pain, no bowel changes.  Musculoskeletal: Negative for gait problem or joint swelling.  Skin: Negative for rash.   Neurological: Negative for dizziness or headache.  No other specific complaints in a complete review of systems (except as listed in HPI above).  Objective  Vitals:   11/23/16 1041  BP: 120/80  Pulse: 63  Resp: 16  Temp: 97.7 F (36.5 C)  TempSrc: Oral  SpO2: 95%  Weight: 237 lb (107.5 kg)  Height: 5\' 6"  (1.676 m)    Body mass index is 38.25 kg/m.  Physical Exam  Constitutional: Patient appears well-developed and well-nourished. Obese No distress.  HEENT: head atraumatic, normocephalic, pupils equal and reactive to light, neck supple, throat within normal limits Cardiovascular: Normal rate, regular rhythm and normal heart sounds.  No murmur heard. No BLE edema. Pulmonary/Chest: Effort normal and breath sounds normal. No respiratory distress. Abdominal: Soft.  There is no tenderness. Psychiatric: Patient has a normal mood and affect. behavior is normal. Judgment and thought  content normal. Muscular Skeletal: wearing a left thumb brace for tendinitis , no swelling or atrophy, no pain during palpation ( She states pain is with activity and when she first gets up in am)  Recent Results (from the past 2160 hour(s))  POCT HgB A1C     Status: None   Collection Time: 09/05/16  9:04 AM  Result Value Ref Range   Hemoglobin A1C 6.8   POCT UA - Microalbumin     Status: None   Collection Time: 09/05/16  9:05 AM  Result Value Ref Range   Microalbumin Ur, POC 20 mg/L   Creatinine, POC  mg/dL   Albumin/Creatinine Ratio, Urine, POC    TSH     Status: None   Collection Time: 09/05/16 10:49 AM  Result Value Ref Range   TSH 1.14 mIU/L    Comment:   Reference Range   >  or = 20 Years  0.40-4.50   Pregnancy Range First trimester  0.26-2.66 Second trimester 0.55-2.73 Third trimester  0.43-2.91         PHQ2/9: Depression screen [facility] 2/9 11/23/2016 09/05/2016 08/03/2016 06/03/2016 03/03/2016  Decreased Interest 0 0 0 0 0  Down, Depressed, Hopeless 0 0 0 0 0  PHQ - 2 Score 0 0 0 0 0     Fall Risk: Fall Risk  11/23/2016 09/05/2016 08/03/2016 06/03/2016 03/03/2016  Falls in the past year? No No No No No      Functional Status Survey: Is the patient deaf or have difficulty hearing?: No Does the patient have difficulty seeing, even when wearing glasses/contacts?: No Does the patient have difficulty concentrating, remembering, or making decisions?: No Does the patient have difficulty walking or climbing stairs?: No Does the patient have difficulty dressing or bathing?: No Does the patient have difficulty doing errands alone such as visiting a doctor's office or shopping?: No    Assessment & Plan  1. 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 Type 2 diabetes mellitus with diabetic nephropathy, without long-term current use of insulin (HCC)  Continue medication   3. Benign essential HTN  -  irbesartan-hydrochlorothiazide (AVALIDE) 300-12.5 MG tablet; Take 1 tablet by mouth daily.  Dispense: 90 tablet; Refill: 1  4. History of thyroidectomy, total  Last TSH is at goal   5. Dyslipidemia  Continue statin therapy   6. Obesity (BMI 30-39.9)  Discussed with the patient the risk posed by an increased BMI. Discussed importance of portion control, calorie counting and at least 150 minutes of physical activity weekly. Avoid sweet beverages and drink more water. Eat at least 6 servings of fruit and vegetables daily   7. Carpal tunnel syndrome, unspecified laterality  Doing well at this time - gabapentin (NEURONTIN) 300 MG capsule; Take 1 capsule (300 mg total) by mouth at bedtime.  Dispense: 270 capsule; Refill: 1 - Ambulatory referral to Orthopedic Surgery  8. Left wrist tendinitis  She does not want to resume nsaid's at this time, willing to see Ortho - Ambulatory referral to Orthopedic Surgery

## 2016-12-06 ENCOUNTER — Other Ambulatory Visit: Payer: Self-pay

## 2016-12-06 ENCOUNTER — Telehealth: Payer: Self-pay

## 2016-12-06 DIAGNOSIS — G5600 Carpal tunnel syndrome, unspecified upper limb: Principal | ICD-10-CM

## 2016-12-06 DIAGNOSIS — R809 Proteinuria, unspecified: Secondary | ICD-10-CM

## 2016-12-06 DIAGNOSIS — E890 Postprocedural hypothyroidism: Secondary | ICD-10-CM

## 2016-12-06 DIAGNOSIS — E1141 Type 2 diabetes mellitus with diabetic mononeuropathy: Secondary | ICD-10-CM

## 2016-12-06 DIAGNOSIS — E1121 Type 2 diabetes mellitus with diabetic nephropathy: Secondary | ICD-10-CM

## 2016-12-06 DIAGNOSIS — E1129 Type 2 diabetes mellitus with other diabetic kidney complication: Secondary | ICD-10-CM

## 2016-12-06 NOTE — Telephone Encounter (Signed)
Patient called stating she needs the refill on her Trulicity medication please send into [place].

## 2016-12-06 NOTE — Telephone Encounter (Signed)
Pharmacy called to ask if they could switch brand due to normal manufacture is unable to produce enough medication for the demand. [facility] is asking to switch brands, Dr. Ancil Boozer stated this was ok expect patient will have to come back in 6 weeks to recheck TSH.

## 2016-12-09 ENCOUNTER — Ambulatory Visit (INDEPENDENT_AMBULATORY_CARE_PROVIDER_SITE_OTHER): Payer: BLUE CROSS/BLUE SHIELD

## 2016-12-09 DIAGNOSIS — G5600 Carpal tunnel syndrome, unspecified upper limb: Secondary | ICD-10-CM

## 2016-12-09 DIAGNOSIS — E1141 Type 2 diabetes mellitus with diabetic mononeuropathy: Secondary | ICD-10-CM

## 2016-12-09 LAB — POCT GLYCOSYLATED HEMOGLOBIN (HGB A1C): HEMOGLOBIN A1C: 6.4

## 2016-12-10 MED ORDER — DULAGLUTIDE 1.5 MG/0.5ML ~~LOC~~ SOAJ: 1.5000 mg | SUBCUTANEOUS | 5 refills | Status: DC

## 2016-12-25 DIAGNOSIS — M654 Radial styloid tenosynovitis [de Quervain]: Secondary | ICD-10-CM | POA: Insufficient documentation

## 2017-01-10 ENCOUNTER — Encounter
Admission: RE | Admit: 2017-01-10 | Discharge: 2017-01-10 | Disposition: A | Discharge status: Home / Self Care | Payer: BLUE CROSS/BLUE SHIELD | Source: Ambulatory Visit | Attending: Orthopedic Surgery | Admitting: Orthopedic Surgery

## 2017-01-10 DIAGNOSIS — E7800 Pure hypercholesterolemia, unspecified: Secondary | ICD-10-CM | POA: Insufficient documentation

## 2017-01-10 DIAGNOSIS — Z01818 Encounter for other preprocedural examination: Secondary | ICD-10-CM | POA: Insufficient documentation

## 2017-01-10 HISTORY — DX: Hypothyroidism, unspecified: E03.9

## 2017-01-10 LAB — CBC
HCT: 41.4 % (ref 35.0–47.0)
HEMOGLOBIN: 13.7 g/dL (ref 12.0–16.0)
MCH: 28.3 pg (ref 26.0–34.0)
MCHC: 33 g/dL (ref 32.0–36.0)
MCV: 85.7 fL (ref 80.0–100.0)
PLATELETS: 179 10*3/uL (ref 150–440)
RBC: 4.83 MIL/uL (ref 3.80–5.20)
RDW: 15 % — ABNORMAL HIGH (ref 11.5–14.5)
WBC: 6.7 10*3/uL (ref 3.6–11.0)

## 2017-01-10 LAB — BASIC METABOLIC PANEL
Anion gap: 7 (ref 5–15)
BUN: 15 mg/dL (ref 6–20)
CHLORIDE: 104 mmol/L (ref 101–111)
CO2: 29 mmol/L (ref 22–32)
Calcium: 9.2 mg/dL (ref 8.9–10.3)
Creatinine, Ser: 1.03 mg/dL — ABNORMAL HIGH (ref 0.44–1.00)
Glucose, Bld: 116 mg/dL — ABNORMAL HIGH (ref 65–99)
POTASSIUM: 3.5 mmol/L (ref 3.5–5.1)
SODIUM: 140 mmol/L (ref 135–145)

## 2017-01-10 NOTE — Pre-Procedure Instructions (Signed)
Component Name Value Ref Range  LV Ejection Fraction (%) 45   Aortic Valve Stenosis Grade none   Aortic Valve Regurgitation Grade none   Aortic Valve Max Velocity (m/s) 1.1 m/sec   Mitral Valve Stenosis Grade none   Mitral Valve Regurgitation Grade trivial   Tricuspid Valve Regurgitation Grade mild   Tricuspid Valve Regurgitation Max Velocity (m/s) 2.2 m/sec   Right Ventricle Systolic Pressure (mmHg) 0000000 mmHg   LV End Diastolic Diameter (cm) 4.6 cm  LV End Systolic Diameter (cm) 3.6 cm  LV Septum Wall Thickness (cm) 1.1 cm  LV Posterior Wall Thickness (cm) 0.99 cm  Left Atrium Diameter (cm) 4.2 cm  Result Narrative  CARDIOLOGY KRISANNE, [facility] [id number] [facility] Acct #: [id number] [address] [place], [place] [address] Date: 12/23/2015 08:19 AM  Adult Female Age: 56 yrs ECHOCARDIOGRAM REPORTOutpatient STUDY:CHEST WALL TAPE: KC::KCWC  ECHO:Yes DOPPLER:YesFILE: MD1:CALLWOOD, DWAYNE DENNIS COLOR:YesCONTRAST:Yes MACHINE:[employer]ips RV BIOPSY:No 3D:No SOUND QLTY:Moderate

## 2017-01-10 NOTE — Patient Instructions (Addendum)
  Your procedure is scheduled on: 01/18/17 Wed  Report to [facility] 2nd floor [facility] Entrance-take elevator on left to 2nd floor.  Check in with surgery information desk.) To find out your arrival time please call [phone] between 1PM - 3PM on 01/17/17 Tues  Remember: Instructions that are not followed completely may result in serious medical risk, up to and including death, or upon the discretion of your surgeon and anesthesiologist your surgery may need to be rescheduled.    _x___ 1. Do not eat food or drink liquids after midnight. No gum chewing or hard candies.     __x__ 2. No Alcohol for 24 hours before or after surgery.   __x__3. No Smoking for 24 prior to surgery.   ____  4. Bring all medications with you on the day of surgery if instructed.    __x__ 5. Notify your doctor if there is any change in your medical condition     (cold, fever, infections).     Do not wear jewelry, make-up, hairpins, clips or nail polish.  Do not wear lotions, powders, or perfumes. You may wear deodorant.  Do not shave 48 hours prior to surgery. Men may shave face and neck.  Do not bring valuables to the hospital.    [facility] is not responsible for any belongings or valuables.               Contacts, dentures or bridgework may not be worn into surgery.  Leave your suitcase in the car. After surgery it may be brought to your room.  For patients admitted to the hospital, discharge time is determined by your treatment team.   Patients discharged the day of surgery will not be allowed to drive home.  You will need someone to drive you home and stay with you the night of your procedure.    Please read over the following fact sheets that you were given:   [facility] Preparing for Surgery and or MRSA Information   _x___ Take these medicines the morning of surgery with A SIP OF WATER:    1. levothyroxine (SYNTHROID  2.  3.  4.  5.  6.  ____Fleets enema or Magnesium  Citrate as directed.   _x___ Use CHG Soap or sage wipes as directed on instruction sheet   ____ Use inhalers on the day of surgery and bring to hospital day of surgery  ____ Stop metformin 2 days prior to surgery    ____ Take 1/2 of usual insulin dose the night before surgery and none on the morning of           surgery.   __x__ Stop Aspirin, Coumadin, Pllavix ,Eliquis, Effient, or Pradaxa  Stop Aspirin 1 week before surgery  x__ Stop Anti-inflammatories such as Advil, Aleve, Ibuprofen, Motrin, Naproxen,          Naprosyn, Goodies powders or aspirin products. Ok to take Tylenol.   _x___ Stop supplements until after surgery.  Stop fish oil 1 week before surgery.  ____ Bring C-Pap to the hospital.

## 2017-01-10 NOTE — Pre-Procedure Instructions (Signed)
Samantha Copeland with Dr Clydell Hakim office called stating the correct side for surgery is the left hand. "Will have Dr Marry Guan correct the notes." per Samantha Copeland.

## 2017-01-10 NOTE — Pre-Procedure Instructions (Signed)
Patient scheduled for left de quervain release office note indicates right side called office to verify.

## 2017-01-18 ENCOUNTER — Ambulatory Visit
Admission: RE | Admit: 2017-01-18 | Discharge: 2017-01-18 | Disposition: A | Discharge status: Home / Self Care | Payer: BLUE CROSS/BLUE SHIELD | Source: Ambulatory Visit | Attending: Orthopedic Surgery | Admitting: Orthopedic Surgery

## 2017-01-18 ENCOUNTER — Encounter
Admission: RE | Disposition: A | Discharge status: Home / Self Care | Payer: Self-pay | Source: Ambulatory Visit | Attending: Orthopedic Surgery

## 2017-01-18 ENCOUNTER — Ambulatory Visit: Payer: BLUE CROSS/BLUE SHIELD | Admitting: Anesthesiology

## 2017-01-18 ENCOUNTER — Encounter: Payer: Self-pay | Admitting: *Deleted

## 2017-01-18 DIAGNOSIS — M659 Synovitis and tenosynovitis, unspecified: Secondary | ICD-10-CM | POA: Insufficient documentation

## 2017-01-18 DIAGNOSIS — I739 Peripheral vascular disease, unspecified: Secondary | ICD-10-CM | POA: Insufficient documentation

## 2017-01-18 DIAGNOSIS — I10 Essential (primary) hypertension: Secondary | ICD-10-CM | POA: Diagnosis not present

## 2017-01-18 DIAGNOSIS — Z79899 Other long term (current) drug therapy: Secondary | ICD-10-CM | POA: Diagnosis not present

## 2017-01-18 DIAGNOSIS — M1990 Unspecified osteoarthritis, unspecified site: Secondary | ICD-10-CM | POA: Insufficient documentation

## 2017-01-18 DIAGNOSIS — N289 Disorder of kidney and ureter, unspecified: Secondary | ICD-10-CM | POA: Insufficient documentation

## 2017-01-18 DIAGNOSIS — G709 Myoneural disorder, unspecified: Secondary | ICD-10-CM | POA: Diagnosis not present

## 2017-01-18 HISTORY — PX: DORSAL COMPARTMENT RELEASE: SHX5039

## 2017-01-18 LAB — GLUCOSE, CAPILLARY
GLUCOSE-CAPILLARY: 103 mg/dL — AB (ref 65–99)
Glucose-Capillary: 109 mg/dL — ABNORMAL HIGH (ref 65–99)

## 2017-01-18 SURGERY — RELEASE, FIRST DORSAL COMPARTMENT, HAND
Anesthesia: General | Site: Wrist | Laterality: Left | Wound class: Clean

## 2017-01-18 MED ORDER — CHLORHEXIDINE GLUCONATE 4 % EX LIQD: 60.0000 mL | Freq: Once | CUTANEOUS | Status: DC

## 2017-01-18 MED ORDER — FENTANYL CITRATE (PF) 100 MCG/2ML IJ SOLN
INTRAMUSCULAR | Status: AC
  Filled 2017-01-18: qty 2

## 2017-01-18 MED ORDER — HYDROCODONE-ACETAMINOPHEN 5-325 MG PO TABS: 1.0000 | ORAL_TABLET | ORAL | 0 refills | Status: DC | PRN

## 2017-01-18 MED ORDER — ACETAMINOPHEN 10 MG/ML IV SOLN
INTRAVENOUS | Status: DC | PRN
  Administered 2017-01-18: 1000 mg via INTRAVENOUS

## 2017-01-18 MED ORDER — FAMOTIDINE 20 MG PO TABS
ORAL_TABLET | ORAL | Status: AC
  Filled 2017-01-18: qty 1

## 2017-01-18 MED ORDER — PROPOFOL 10 MG/ML IV BOLUS
INTRAVENOUS | Status: AC
  Filled 2017-01-18: qty 20

## 2017-01-18 MED ORDER — GLYCOPYRROLATE 0.2 MG/ML IJ SOLN
INTRAMUSCULAR | Status: DC | PRN
  Administered 2017-01-18: 0.2 mg via INTRAVENOUS

## 2017-01-18 MED ORDER — BUPIVACAINE HCL (PF) 0.25 % IJ SOLN
INTRAMUSCULAR | Status: AC
  Filled 2017-01-18: qty 30

## 2017-01-18 MED ORDER — PROPOFOL 10 MG/ML IV BOLUS
INTRAVENOUS | Status: DC | PRN
  Administered 2017-01-18: 150 mg via INTRAVENOUS
  Administered 2017-01-18: 30 mg via INTRAVENOUS

## 2017-01-18 MED ORDER — FENTANYL CITRATE (PF) 100 MCG/2ML IJ SOLN
INTRAMUSCULAR | Status: DC | PRN
  Administered 2017-01-18 (×5): 25 ug via INTRAVENOUS

## 2017-01-18 MED ORDER — ONDANSETRON HCL 4 MG/2ML IJ SOLN: 4.0000 mg | Freq: Once | INTRAMUSCULAR | Status: DC | PRN

## 2017-01-18 MED ORDER — GLYCOPYRROLATE 0.2 MG/ML IJ SOLN
INTRAMUSCULAR | Status: AC
  Filled 2017-01-18: qty 1

## 2017-01-18 MED ORDER — CEFAZOLIN SODIUM-DEXTROSE 2-4 GM/100ML-% IV SOLN
2.0000 g | INTRAVENOUS | Status: AC
  Administered 2017-01-18: 2 g via INTRAVENOUS

## 2017-01-18 MED ORDER — FAMOTIDINE 20 MG PO TABS
20.0000 mg | ORAL_TABLET | Freq: Once | ORAL | Status: AC
  Administered 2017-01-18: 20 mg via ORAL

## 2017-01-18 MED ORDER — LIDOCAINE HCL (PF) 2 % IJ SOLN
INTRAMUSCULAR | Status: AC
  Filled 2017-01-18: qty 2

## 2017-01-18 MED ORDER — FENTANYL CITRATE (PF) 100 MCG/2ML IJ SOLN
25.0000 ug | INTRAMUSCULAR | Status: DC | PRN
  Administered 2017-01-18: 25 ug via INTRAVENOUS

## 2017-01-18 MED ORDER — CEFAZOLIN SODIUM-DEXTROSE 2-4 GM/100ML-% IV SOLN
INTRAVENOUS | Status: AC
  Filled 2017-01-18: qty 100

## 2017-01-18 MED ORDER — ACETAMINOPHEN 10 MG/ML IV SOLN
INTRAVENOUS | Status: AC
  Filled 2017-01-18: qty 100

## 2017-01-18 MED ORDER — MIDAZOLAM HCL 2 MG/2ML IJ SOLN
INTRAMUSCULAR | Status: AC
  Filled 2017-01-18: qty 2

## 2017-01-18 MED ORDER — SODIUM CHLORIDE 0.9 % IV SOLN
INTRAVENOUS | Status: DC
  Administered 2017-01-18: 11:00:00 via INTRAVENOUS

## 2017-01-18 MED ORDER — MIDAZOLAM HCL 2 MG/2ML IJ SOLN
INTRAMUSCULAR | Status: DC | PRN
  Administered 2017-01-18: 2 mg via INTRAVENOUS

## 2017-01-18 SURGICAL SUPPLY — 27 items
BANDAGE ELASTIC 2 LF NS (GAUZE/BANDAGES/DRESSINGS) ×3 IMPLANT
BLADE SURG 15 STRL LF DISP TIS (BLADE) ×1 IMPLANT
BLADE SURG 15 STRL SS (BLADE) ×2
BNDG ESMARK 4X12 TAN STRL LF (GAUZE/BANDAGES/DRESSINGS) ×3 IMPLANT
CANISTER SUCT 1200ML W/VALVE (MISCELLANEOUS) ×3 IMPLANT
CUFF TOURN 18 STER (MISCELLANEOUS) ×3 IMPLANT
DURAPREP 26ML APPLICATOR (WOUND CARE) ×3 IMPLANT
ELECT CAUTERY NEEDLE TIP 1.0 (MISCELLANEOUS) ×3
ELECT REM PT RETURN 9FT ADLT (ELECTROSURGICAL) ×3
ELECTRODE CAUTERY NEDL TIP 1.0 (MISCELLANEOUS) ×1 IMPLANT
ELECTRODE REM PT RTRN 9FT ADLT (ELECTROSURGICAL) ×1 IMPLANT
GAUZE SPONGE 4X4 12PLY STRL (GAUZE/BANDAGES/DRESSINGS) ×3 IMPLANT
GAUZE STRETCH 2X75IN STRL (MISCELLANEOUS) ×3 IMPLANT
GLOVE BIO SURGEON STRL SZ8 (GLOVE) ×6 IMPLANT
GLOVE BIOGEL M STRL SZ7.5 (GLOVE) ×9 IMPLANT
GOWN STRL REUS W/ TWL LRG LVL3 (GOWN DISPOSABLE) ×2 IMPLANT
GOWN STRL REUS W/TWL LRG LVL3 (GOWN DISPOSABLE) ×4
KIT RM TURNOVER STRD PROC AR (KITS) ×3 IMPLANT
NEEDLE HYPO 25X1 1.5 SAFETY (NEEDLE) ×3 IMPLANT
NS IRRIG 500ML POUR BTL (IV SOLUTION) ×3 IMPLANT
PACK EXTREMITY ARMC (MISCELLANEOUS) ×3 IMPLANT
PAD CAST CTTN 4X4 STRL (SOFTGOODS) ×1 IMPLANT
PADDING CAST COTTON 4X4 STRL (SOFTGOODS) ×2
SPLINT CAST 1 STEP 3X12 (MISCELLANEOUS) ×3 IMPLANT
STOCKINETTE STRL 4IN 9604848 (GAUZE/BANDAGES/DRESSINGS) ×3 IMPLANT
SUT ETHILON 5-0 FS-2 18 BLK (SUTURE) ×3 IMPLANT
SYRINGE 10CC LL (SYRINGE) ×3 IMPLANT

## 2017-01-18 NOTE — Anesthesia Preprocedure Evaluation (Addendum)
Anesthesia Evaluation  Patient identified by MRN, date of birth, ID band Patient awake    Reviewed: Allergy & Precautions, NPO status , Patient's Chart, lab work & pertinent test results, reviewed documented beta blocker date and time   Airway Mallampati: III  TM Distance: >3 FB     Dental  (+) Chipped, Poor Dentition, Missing, Dental Advisory Given   Pulmonary           Cardiovascular hypertension, Pt. on medications + Peripheral Vascular Disease       Neuro/Psych  Neuromuscular disease    GI/Hepatic   Endo/Other  diabetesHypothyroidism   Renal/GU Renal disease     Musculoskeletal  (+) Arthritis ,   Abdominal   Peds  Hematology   Anesthesia Other Findings Obese.EKG reviewed. Poor r waves. No cardiac symptoms, will proceed.  Reproductive/Obstetrics                           Anesthesia Physical Anesthesia Plan  ASA: III  Anesthesia Plan: General   Post-op Pain Management:    Induction: Intravenous  Airway Management Planned: LMA  Additional Equipment:   Intra-op Plan:   Post-operative Plan:   Informed Consent: I have reviewed the patients History and Physical, chart, labs and discussed the procedure including the risks, benefits and alternatives for the proposed anesthesia with the patient or authorized representative who has indicated his/her understanding and acceptance.     Plan Discussed with: CRNA  Anesthesia Plan Comments:         Anesthesia Quick Evaluation

## 2017-01-18 NOTE — Anesthesia Postprocedure Evaluation (Signed)
Anesthesia Post Note  Patient: Samantha Copeland  Procedure(s) Performed: Procedure(s) (LRB): RELEASE DORSAL COMPARTMENT (DEQUERVAIN) (Left)  Patient location during evaluation: PACU Anesthesia Type: General Level of consciousness: awake and alert Pain management: pain level controlled Vital Signs Assessment: post-procedure vital signs reviewed and stable Respiratory status: spontaneous breathing, nonlabored ventilation, respiratory function stable and patient connected to nasal cannula oxygen Cardiovascular status: blood pressure returned to baseline and stable Postop Assessment: no signs of nausea or vomiting Anesthetic complications: no     Last Vitals:  Vitals:   01/18/17 1437 01/18/17 1513  BP: (!) 145/73 140/69  Pulse: (!) 52 (!) 57  Resp:  16  Temp: (!) 36 C     Last Pain:  Vitals:   01/18/17 1422  TempSrc:   PainSc: [place]

## 2017-01-18 NOTE — Anesthesia Post-op Follow-up Note (Cosign Needed)
Anesthesia QCDR form completed.        

## 2017-01-18 NOTE — Transfer of Care (Signed)
Immediate Anesthesia Transfer of Care Note  Patient: Samantha Copeland  Procedure(s) Performed: Procedure(s): RELEASE DORSAL COMPARTMENT (DEQUERVAIN) (Left)  Patient Location: PACU  Anesthesia Type:General  Level of Consciousness: patient cooperative and lethargic  Airway & Oxygen Therapy: Patient Spontanous Breathing and Patient connected to face mask oxygen  Post-op Assessment: Report given to RN and Post -op Vital signs reviewed and stable  Post vital signs: Reviewed and stable  Last Vitals:  Vitals:   01/18/17 1023 01/18/17 1337  BP: 133/75 135/78  Pulse: 70   Resp: 16   Temp: 36.7 C 36.6 C    Last Pain:  Vitals:   01/18/17 1023  TempSrc: Oral         Complications: No apparent anesthesia complications

## 2017-01-18 NOTE — H&P (Signed)
The patient has been re-examined, and the chart reviewed, and there have been no interval changes to the documented history and physical.    The risks, benefits, and alternatives have been discussed at length. The patient expressed understanding of the risks benefits and agreed with plans for surgical intervention.  James P. Hooten, Jr. M.D.    

## 2017-01-18 NOTE — Op Note (Signed)
OPERATIVE NOTE  DATE OF SURGERY:  01/18/2017  PATIENT NAME:  Samantha Copeland   DOB: [birth year]  MRN: [id number]  PRE-OPERATIVE DIAGNOSIS: Left DeQuervain's stenosing tenosynovitis  POST-OPERATIVE DIAGNOSIS:  Same  PROCEDURE:  Left DeQuervain's release  SURGEON:  Marciano Sequin. M.D.  ANESTHESIA: general  ESTIMATED BLOOD LOSS: Minimal  FLUIDS REPLACED: 600 mL of crystalloid  TOURNIQUET TIME:  36 minutes  DRAINS: None  INDICATIONS FOR SURGERY: Samantha Copeland is a 56 y.o. year old female with a  history of pain over the first dorsal compartment of the left wrist. The patient had not seen any significant improvement despite conservative nonsurgical intervention. After discussion of the risks and benefits of surgical intervention, the patient expressed understanding of the risks benefits and agree with plans for release of the first dorsal compartment of the wrist (DeQuervain's release).   PROCEDURE IN DETAIL: The patient was brought into the operating room and after adequate general anesthesia, a tourniquet was placed on the patient's left upper arm.The left hand and arm were prepped with alcohol and Duraprep and draped in the usual sterile fashion. A "time-out" was performed as per usual protocol. The hand and forearm were exsanguinated using an Esmarch and the tourniquet was inflated to 250 mmHg. Loupe magnification was used throughout the procedure. An incision was made over the first dorsal compartment approximately 0.5 centimeters proximal to the tip of the radial styloid. Care was taken to identify and protect the radial sensory branches. Dissection was carried down to the annular ligament. The annular ligament was sharply incised and there was noted to be some thickening and inflammatory changes to the tenosynovium which was subsequently debrided. The extensor pollicis brevis and abductor pollicis longus were elevated out of the wound with a hook to document complete  decompression. Free and independent movement of the tendons was noticed by passively moving the thumb. The tourniquet was deflated after total tourniquet time of 36 minutes. Hemostasis was achieved using electrocautery. The skin was then re-approximated with interrupted sutures of #5-0 nylon. A sterile compression dressing was applied   The patient tolerated the procedure well and was transported to the PACU in stable condition.  [name], M.D.

## 2017-01-18 NOTE — Anesthesia Procedure Notes (Signed)
Procedure Name: LMA Insertion Date/Time: 01/18/2017 12:27 PM Performed by: Jonna Clark Pre-anesthesia Checklist: Patient identified, Patient being monitored, Timeout performed, Emergency Drugs available and Suction available Patient Re-evaluated:Patient Re-evaluated prior to inductionOxygen Delivery Method: Circle system utilized Preoxygenation: Pre-oxygenation with 100% oxygen Intubation Type: IV induction Ventilation: Mask ventilation without difficulty LMA: LMA inserted LMA Size: 4.0 Tube type: Oral Number of attempts: 1 Placement Confirmation: positive ETCO2 and breath sounds checked- equal and bilateral Tube secured with: Tape Dental Injury: Teeth and Oropharynx as per pre-operative assessment

## 2017-01-18 NOTE — Progress Notes (Signed)
Capillary refill positive to left hand   Can wiggle fingers on left 

## 2017-01-18 NOTE — Brief Op Note (Signed)
01/18/2017  1:38 PM  PATIENT:  Samantha Copeland  56 y.o. female  PRE-OPERATIVE DIAGNOSIS:  Left dequervains tenosynovitis  POST-OPERATIVE DIAGNOSIS:  Same  PROCEDURE:  Procedure(s): RELEASE DORSAL COMPARTMENT (DEQUERVAIN) (Left)  SURGEON:  Surgeon(s) and Role:    * Dereck Leep, MD - Primary   ASSISTANTS: none   ANESTHESIA:   general  EBL:  Total I/O In: 600 [I.V.:600] Out: -   BLOOD ADMINISTERED:none  DRAINS: none   LOCAL MEDICATIONS USED:  MARCAINE     SPECIMEN:  No Specimen  DISPOSITION OF SPECIMEN:  N/A  COUNTS:  YES  TOURNIQUET:   36 minutes  DICTATION: .Dragon Dictation  PLAN OF CARE: Discharge to home after PACU  PATIENT DISPOSITION:  PACU - hemodynamically stable.   Delay start of Pharmacological VTE agent (>24hrs) due to surgical blood loss or risk of bleeding: not applicable

## 2017-01-18 NOTE — Progress Notes (Signed)
Dressing clean and dry to left upper extremity

## 2017-01-18 NOTE — Discharge Instructions (Signed)
°  Instructions after Hand / Wrist Surgery   James P. Holley Bouche., M.D.  Dept. of [facility]  [place], [place]  [address]   Phone: [phone]   Fax: [phone]   DIET:  Drink plenty of non-alcoholic fluids & begin a light diet.  Resume your normal diet the day after surgery.  ACTIVITY:   Keep the hand elevated above the level of the elbow.  Begin gently moving the fingers on a regular basis to avoid stiffness.  Avoid any heavy lifting, pushing, or pulling with the operative hand.  Do not drive or operate any equipment until instructed.  WOUND CARE:   Keep the splint/bandage clean and dry.   The splint and stitches will be removed in the office.  Continue to use the ice packs periodically to reduce pain and swelling.  You may bathe or shower after the stitches are removed at the first office visit following surgery.  MEDICATIONS:  You may resume your regular medications.  Please take the pain medication as prescribed.  Do not take pain medication on an empty stomach.  Do not drive or drink alcoholic beverages when taking pain medications.  CALL THE OFFICE FOR:  Temperature above 101 degrees  Excessive bleeding or drainage on the dressing.  Excessive swelling, coldness, or paleness of the fingers.  Persistent nausea and vomiting.  FOLLOW-UP:   You should have an appointment to return to the office in 7-10 days after surgery.   REMEMBER: R.I.C.E. = Rest, Ice, Compression, Elevation !

## 2017-01-25 ENCOUNTER — Other Ambulatory Visit: Payer: Self-pay | Admitting: Family Medicine

## 2017-01-25 LAB — TSH: TSH: 0.51 mIU/L

## 2017-02-27 ENCOUNTER — Encounter: Payer: Self-pay | Admitting: Family Medicine

## 2017-02-27 ENCOUNTER — Ambulatory Visit (INDEPENDENT_AMBULATORY_CARE_PROVIDER_SITE_OTHER): Payer: BLUE CROSS/BLUE SHIELD | Admitting: Family Medicine

## 2017-02-27 VITALS — BP 116/62 | HR 67 | Temp 98.0°F | Resp 18 | Ht 66.0 in | Wt 244.4 lb

## 2017-02-27 DIAGNOSIS — E669 Obesity, unspecified: Secondary | ICD-10-CM

## 2017-02-27 DIAGNOSIS — E1141 Type 2 diabetes mellitus with diabetic mononeuropathy: Secondary | ICD-10-CM | POA: Diagnosis not present

## 2017-02-27 DIAGNOSIS — E1121 Type 2 diabetes mellitus with diabetic nephropathy: Secondary | ICD-10-CM | POA: Diagnosis not present

## 2017-02-27 DIAGNOSIS — E890 Postprocedural hypothyroidism: Secondary | ICD-10-CM | POA: Diagnosis not present

## 2017-02-27 DIAGNOSIS — E785 Hyperlipidemia, unspecified: Secondary | ICD-10-CM | POA: Diagnosis not present

## 2017-02-27 DIAGNOSIS — G5600 Carpal tunnel syndrome, unspecified upper limb: Secondary | ICD-10-CM

## 2017-02-27 DIAGNOSIS — I10 Essential (primary) hypertension: Secondary | ICD-10-CM

## 2017-02-27 LAB — POCT GLYCOSYLATED HEMOGLOBIN (HGB A1C): Hemoglobin A1C: 7

## 2017-02-27 MED ORDER — LEVOTHYROXINE SODIUM 100 MCG PO TABS: 100.0000 ug | ORAL_TABLET | Freq: Every day | ORAL | 1 refills | Status: DC

## 2017-02-27 MED ORDER — ATORVASTATIN CALCIUM 40 MG PO TABS: 40.0000 mg | ORAL_TABLET | Freq: Every day | ORAL | 1 refills | Status: DC

## 2017-02-27 MED ORDER — SEMAGLUTIDE (1 MG/DOSE) 2 MG/1.5ML ~~LOC~~ SOPN: 0.5000 mg | PEN_INJECTOR | SUBCUTANEOUS | 2 refills | Status: DC

## 2017-02-27 NOTE — Progress Notes (Signed)
Name: Samantha Copeland   MRN: [id number]    DOB: 01-06-1961   Date:02/27/2017       Progress Note  Subjective  Chief Complaint  Chief Complaint  Patient presents with  . Diabetes    3 month follow up checks up to 2 times daily 90-108 avg  . Hypertension  . Hyperlipidemia  . 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 Symptoms of carpal tunnel is better and feet tingling has resolved, still taking Gabapentin once daily.  Continue life style modification. She is following a diabetic diet, eating fish twice weekly. Urine micro back in 08/2016  normal, she is on ARB.  HTN: bp is at goal, denies side effects of medications. Compliant with medication . No chest pain or SOB or palpitation, or orthostatic changes  Hyperlipidemia: she is on Atorvastatin daily.  Discussed ways to increase HDL, such as physical activity, tree nuts and fish during her last visit and she has been following the recommendation   Obesity: changed diet and has been on Trulicity since June 7017, weight improved but now is gradually going up again.   Goiter: seeing Dr. Richardson Landry had total thyroidectomy in December 2016, doing well, on Synthroid 100 mcg daily and has been released by Dr. Gabriel Carina. No tachycardia, constipation has resolved with dietary modification - eating more vegetables. Last TSH was at goal   Left wrist tendinitis; seen at [place], had surgery for release back in 01/2017. She is doing well   Patient Active Problem List   Diagnosis Date Noted  . Hypothyroidism, postop 03/14/2016  . Benign essential HTN 05/30/2015  . Carpal tunnel syndrome 05/30/2015  . Dyslipidemia 05/30/2015  . 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 History of thyroidectomy, total 05/30/2015  . Microalbuminuria 05/30/2015   . Obesity (BMI 30-39.9) 05/30/2015  . Diabetes mellitus with renal manifestation ([place]) 05/30/2015    Past Surgical History:  Procedure Laterality Date  . ABDOMINAL HYSTERECTOMY    . 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 BREAST BIOPSY Right    benign  . DORSAL COMPARTMENT RELEASE Left 01/18/2017   Procedure: RELEASE DORSAL COMPARTMENT (DEQUERVAIN);  Surgeon: Dereck Leep, MD;  Location: [facility] ORS;  Service: Orthopedics;  Laterality: Left;  . FINE NEEDLE ASPIRATION  10/01/2013   Thyroid-Dr. Gabriel Carina  . THYROIDECTOMY N/A 12/09/2015   Procedure: THYROIDECTOMY;  Surgeon: Clyde Canterbury, MD;  Location: [facility] ORS;  Service: ENT;  Laterality: N/A;    Family History  Problem Relation Age of Onset  . Diabetes Mother   . Diabetes Father   . Hypertension Father   . Hyperlipidemia Father   . Cancer Father   . Diabetes Sister     Social History   Social History  . Marital status: Married    Spouse name: Orpah Greek  . Number of children: 3  . Years of education: N/A   Occupational History  . 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 Smoking status: Never Smoker  . Smokeless tobacco: Never Used  . Alcohol use 0.0 oz/week     Comment: occ.  . Drug use: No  . Sexual activity: Yes    Partners: Male   Other Topics Concern  . Not on  file   Social History Narrative   Lives with husband, youngest daughter and her grandson     Current Outpatient Prescriptions:  .  acetaminophen (TYLENOL) 325 MG tablet, Take 650 mg by mouth every 6 (six) hours as needed., Disp: , Rfl:  .  aspirin EC 81 MG tablet, Take 81 mg by mouth every evening. , Disp: , Rfl:  .  atorvastatin (LIPITOR) 40 MG tablet, Take 1 tablet (40 mg total) by mouth daily. (Patient taking differently: Take 40 mg by mouth daily at 10 pm. ), Disp: 90 tablet, Rfl: 1 .  Calcium-Phosphorus-Vitamin D (CALCIUM GUMMIES PO), Take 2 tablets by mouth daily., Disp: , Rfl:  .  Dulaglutide  (TRULICITY) 1.5 JJ/0.0XF SOPN, Inject 1.5 mg into the skin once a week. INJECT 1.'5MG'$  SUBCUTANEOUSLY ONCE A WEEK (SUNDAY) (Patient taking differently: Inject 1.5 mg into the skin every Sunday. INJECT 1.'5MG'$  SUBCUTANEOUSLY ONCE A WEEK (SUNDAY)), Disp: 4 pen, Rfl: 5 .  gabapentin (NEURONTIN) 300 MG capsule, Take 1 capsule (300 mg total) by mouth at bedtime., Disp: 270 capsule, Rfl: 1 .  HYDROcodone-acetaminophen (NORCO) 5-325 MG tablet, Take 1-2 tablets by mouth every 4 (four) hours as needed for moderate pain., Disp: 30 tablet, Rfl: 0 .  ibuprofen (ADVIL,MOTRIN) 200 MG tablet, Take 600 mg by mouth every 6 (six) hours as needed (for pain.)., Disp: , Rfl:  .  irbesartan-hydrochlorothiazide (AVALIDE) 300-12.5 MG tablet, Take 1 tablet by mouth daily. (Patient taking differently: Take 1 tablet by mouth daily at 10 pm. ), Disp: 90 tablet, Rfl: 1 .  levothyroxine (SYNTHROID, LEVOTHROID) 100 MCG tablet, Take 1 tablet (100 mcg total) by mouth daily before breakfast. (Patient taking differently: Take 100 mcg by mouth daily at 6 (six) AM. ), Disp: 90 tablet, Rfl: 1 .  Multiple Vitamin (MULTIVITAMIN WITH MINERALS) TABS tablet, Take 1 tablet by mouth daily. ALIVE MULTIVITAMIN, Disp: , Rfl:  .  naproxen sodium (ANAPROX) 220 MG tablet, Take 440 mg by mouth 2 (two) times daily as needed (for pain (rarely))., Disp: , Rfl:  .  Omega-3 Fatty Acids (FISH OIL) 1000 MG CAPS, Take 1,000 mg by mouth daily., Disp: , Rfl:   No Known Allergies   ROS  Constitutional: Negative for fever , positive for weight change.  Respiratory: Negative for cough and shortness of breath.   Cardiovascular: Negative for chest pain or palpitations.  Gastrointestinal: Negative for abdominal pain, no bowel changes.  Musculoskeletal: Negative for gait problem or joint swelling.  Skin: Negative for rash.  Neurological: Negative for dizziness or headache.  No other specific complaints in a complete review of systems (except as listed in HPI  above).  Objective  Vitals:   02/27/17 0808  BP: 116/62  Pulse: 67  Resp: 18  Temp: 98 F (36.7 C)  SpO2: 95%  Weight: 244 lb 6 oz (110.8 kg)  Height: '5\' 6"'$  (1.676 m)    Body mass index is 39.44 kg/m.  Physical Exam  Constitutional: Patient appears well-developed and well-nourished. Obese No distress.  HEENT: head atraumatic, normocephalic, pupils equal and reactive to light, neck supple, throat within normal limits, s/p thyroidectomy  Cardiovascular: Normal rate, regular rhythm and normal heart sounds.  No murmur heard. No BLE edema. Pulmonary/Chest: Effort normal and breath sounds normal. No respiratory distress. Abdominal: Soft.  There is no tenderness. Psychiatric: Patient has a normal mood and affect. behavior is normal. Judgment and thought content normal. Muscular Skeletal:had surgery of left thumb, well healed, index finger of right hand partially amputated  Recent Results (from the past 2160 hour(s))  POCT HgB A1C     Status: Abnormal   Collection Time: 12/09/16  8:18 AM  Result Value Ref Range   Hemoglobin A1C 6.4   CBC     Status: Abnormal   Collection Time: 01/10/17 11:31 AM  Result Value Ref Range   WBC 6.7 3.6 - 11.0 K/uL   RBC 4.83 3.80 - 5.20 MIL/uL   Hemoglobin 13.7 12.0 - 16.0 g/dL   HCT 41.4 35.0 - 47.0 %   MCV 85.7 80.0 - 100.0 fL   MCH 28.3 26.0 - 34.0 pg   MCHC 33.0 32.0 - 36.0 g/dL   RDW 15.0 (H) 11.5 - 14.5 %   Platelets 179 150 - 440 K/uL  Basic metabolic panel     Status: Abnormal   Collection Time: 01/10/17 11:31 AM  Result Value Ref Range   Sodium 140 135 - 145 mmol/L   Potassium 3.5 3.5 - 5.1 mmol/L   Chloride 104 101 - 111 mmol/L   CO2 29 22 - 32 mmol/L   Glucose, Bld 116 (H) 65 - 99 mg/dL   BUN 15 6 - 20 mg/dL   Creatinine, Ser 1.03 (H) 0.44 - 1.00 mg/dL   Calcium 9.2 8.9 - 10.3 mg/dL   GFR calc non Af Amer >60 >60 mL/min   GFR calc Af Amer >60 >60 mL/min    Comment: (NOTE) The eGFR has been calculated using the CKD EPI  equation. This calculation has not been validated in all clinical situations. eGFR's persistently <60 mL/min signify possible Chronic Kidney Disease.    Anion gap 7 5 - 15  Glucose, capillary     Status: Abnormal   Collection Time: 01/18/17 10:28 AM  Result Value Ref Range   Glucose-Capillary 109 (H) 65 - 99 mg/dL  Glucose, capillary     Status: Abnormal   Collection Time: 01/18/17  1:47 PM  Result Value Ref Range   Glucose-Capillary 103 (H) 65 - 99 mg/dL  TSH     Status: None   Collection Time: 01/25/17  3:53 PM  Result Value Ref Range   TSH 0.51 mIU/L    Comment:   Reference Range   > or = 20 Years  0.40-4.50   Pregnancy Range First trimester  0.26-2.66 Second trimester 0.55-2.73 Third trimester  0.43-2.91     POCT HgB A1C     Status: Abnormal   Collection Time: 02/27/17  8:20 AM  Result Value Ref Range   Hemoglobin A1C 7.0       PHQ2/9: Depression screen [facility] 2/9 11/23/2016 09/05/2016 08/03/2016 06/03/2016 03/03/2016  Decreased Interest 0 0 0 0 0  Down, Depressed, Hopeless 0 0 0 0 0  PHQ - 2 Score 0 0 0 0 0    Fall Risk: Fall Risk  11/23/2016 09/05/2016 08/03/2016 06/03/2016 03/03/2016  Falls in the past year? No No No No No    Functional Status Survey: Is the patient deaf or have difficulty hearing?: No Does the patient have difficulty seeing, even when wearing glasses/contacts?: No Does the patient have difficulty concentrating, remembering, or making decisions?: No Does the patient have difficulty walking or climbing stairs?: No Does the patient have difficulty dressing or bathing?: No Does the patient have difficulty doing errands alone such as visiting a doctor's office or shopping?: No    Assessment & Plan  1. 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 0.5 mg for two weeks after that 1 mg per week  Dispense: 2 pen; Refill: 2  2. Hypothyroidism, postop  Continue medication   3. Type 2 diabetes mellitus with diabetic nephropathy, without long-term current use of insulin (HCC)  On ARB  4. Benign essential HTN  Well controlled  5. History of thyroidectomy, total  - levothyroxine (SYNTHROID, LEVOTHROID) 100 MCG tablet; Take 1 tablet (100 mcg total) by mouth daily before breakfast.  Dispense: 90 tablet; Refill: 1  6. Dyslipidemia  - atorvastatin (LIPITOR) 40 MG tablet; Take 1 tablet (40 mg total) by mouth daily at 10 pm.  Dispense: 90 tablet; Refill: 1  7. Obesity (BMI 30-39.9)  Discussed with the patient the risk posed by an increased BMI. Discussed importance of portion control, calorie counting and at least 150 minutes of physical activity weekly. Avoid sweet beverages and drink more water. Eat at least 6 servings of fruit and vegetables daily

## 2017-03-05 DIAGNOSIS — Z9889 Other specified postprocedural states: Secondary | ICD-10-CM | POA: Insufficient documentation

## 2017-05-23 ENCOUNTER — Other Ambulatory Visit: Payer: Self-pay | Admitting: Family Medicine

## 2017-05-23 DIAGNOSIS — Z1231 Encounter for screening mammogram for malignant neoplasm of breast: Secondary | ICD-10-CM

## 2017-05-23 DIAGNOSIS — E1141 Type 2 diabetes mellitus with diabetic mononeuropathy: Secondary | ICD-10-CM

## 2017-05-23 DIAGNOSIS — G5600 Carpal tunnel syndrome, unspecified upper limb: Principal | ICD-10-CM

## 2017-05-23 NOTE — Telephone Encounter (Signed)
Patient requesting refill of Ozempic to [facility].

## 2017-05-31 ENCOUNTER — Ambulatory Visit (INDEPENDENT_AMBULATORY_CARE_PROVIDER_SITE_OTHER): Payer: BLUE CROSS/BLUE SHIELD | Admitting: Family Medicine

## 2017-05-31 ENCOUNTER — Encounter: Payer: Self-pay | Admitting: Family Medicine

## 2017-05-31 VITALS — BP 122/74 | HR 73 | Temp 98.1°F | Resp 16 | Ht 66.0 in | Wt 236.5 lb

## 2017-05-31 DIAGNOSIS — E890 Postprocedural hypothyroidism: Secondary | ICD-10-CM | POA: Diagnosis not present

## 2017-05-31 DIAGNOSIS — I10 Essential (primary) hypertension: Secondary | ICD-10-CM | POA: Diagnosis not present

## 2017-05-31 DIAGNOSIS — E669 Obesity, unspecified: Secondary | ICD-10-CM

## 2017-05-31 DIAGNOSIS — E785 Hyperlipidemia, unspecified: Secondary | ICD-10-CM | POA: Diagnosis not present

## 2017-05-31 DIAGNOSIS — E1141 Type 2 diabetes mellitus with diabetic mononeuropathy: Secondary | ICD-10-CM

## 2017-05-31 DIAGNOSIS — E1121 Type 2 diabetes mellitus with diabetic nephropathy: Secondary | ICD-10-CM

## 2017-05-31 DIAGNOSIS — G5600 Carpal tunnel syndrome, unspecified upper limb: Secondary | ICD-10-CM

## 2017-05-31 DIAGNOSIS — M65311 Trigger thumb, right thumb: Secondary | ICD-10-CM | POA: Insufficient documentation

## 2017-05-31 LAB — POCT GLYCOSYLATED HEMOGLOBIN (HGB A1C): Hemoglobin A1C: 6.5

## 2017-05-31 MED ORDER — IRBESARTAN-HYDROCHLOROTHIAZIDE 300-12.5 MG PO TABS: 1.0000 | ORAL_TABLET | Freq: Every day | ORAL | 1 refills | Status: DC

## 2017-05-31 NOTE — Progress Notes (Signed)
Name: Samantha Copeland   MRN: [id number]    DOB: 06/23/1961   Date:05/31/2017       Progress Note  Subjective  Chief Complaint  Chief Complaint  Patient presents with  . Diabetes    Checks twice daily, doing well with Ozempic. Has lost 8 pds since last visit. Lowest 80 Highest-133  . Hyperlipidemia  . Hypertension    Denies any symptoms  . Obesity    Walking as much as she can tolerate due to the heat, has lost 8 pds  . Hypothyroidism  . 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 Symptoms of carpal tunnel  and feet tingling has resolved - may stop Gabapentin and monitor. Continue life style modification. She is following a diabetic diet, eating fish twice weekly, walking twice weekly for 3 miles.. Urine micro back in 08/2016  normal, she is on ARB.  HTN: bp is at goal, denies side effects of medications. Compliant with medication . No chest pain or SOB or palpitation, or orthostatic changes  Hyperlipidemia: she is on Atorvastatin daily. Discussed ways to increase HDL, she has been exercising and eating better.   Obesity: changed diet and has been on Trulicity since June 1062 and switched to [facility] March 2018. , weight improved but now is gradually going up again. She is down 8 lbs since last visit, but weight back in 2016 was 234lbs.   Goiter: seeing Dr. Richardson Landry had total thyroidectomy in December 2016, doing well, on Synthroid 100 mcg daily and has been released by Dr. Gabriel Carina. No tachycardia, constipation has resolved with dietary modification - eating more vegetables, less carbohydrates . Recheck level   Patient Active Problem List   Diagnosis Date Noted  . Trigger thumb of right hand 05/31/2017  .  Status post de Quervain's release surgery 03/05/2017  . Hypothyroidism, postop 03/14/2016  . Benign essential HTN 05/30/2015  . Carpal tunnel syndrome 05/30/2015  . Dyslipidemia 05/30/2015  . 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 History of thyroidectomy, total 05/30/2015  . Microalbuminuria 05/30/2015  . Obesity (BMI 30-39.9) 05/30/2015  . Diabetes mellitus with renal manifestation ([place]) 05/30/2015    Past Surgical History:  Procedure Laterality Date  . ABDOMINAL HYSTERECTOMY    . 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 BREAST BIOPSY Right    benign  . DORSAL COMPARTMENT RELEASE Left 01/18/2017   Procedure: RELEASE DORSAL COMPARTMENT (DEQUERVAIN);  Surgeon: Dereck Leep, MD;  Location: [facility] ORS;  Service: Orthopedics;  Laterality: Left;  . FINE NEEDLE ASPIRATION  10/01/2013   Thyroid-Dr. Gabriel Carina  . THYROIDECTOMY N/A 12/09/2015   Procedure: THYROIDECTOMY;  Surgeon: Clyde Canterbury, MD;  Location: [facility] ORS;  Service: ENT;  Laterality: N/A;    Family History  Problem Relation Age of Onset  . Diabetes Mother   . Diabetes Father   . Hypertension Father   . Hyperlipidemia Father   . Cancer Father   . Diabetes Sister     Social History   Social History  . Marital status: Married    Spouse name: Orpah Greek  . Number of children:  3  . Years of education: N/A   Occupational History  . 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 Smoking status: Never Smoker  . Smokeless tobacco: Never Used  . Alcohol use 0.0 oz/week     Comment: occ.  . Drug use: No  . Sexual activity: Yes    Partners: Male   Other Topics Concern  . Not on file   Social History Narrative   Lives with husband, youngest daughter and her grandson     Current Outpatient Prescriptions:  .  acetaminophen (TYLENOL) 325 MG tablet, Take 650 mg by mouth every 6 (six) hours as needed., Disp: , Rfl:  .  aspirin EC 81 MG tablet,  Take 81 mg by mouth every evening. , Disp: , Rfl:  .  atorvastatin (LIPITOR) 40 MG tablet, Take 1 tablet (40 mg total) by mouth daily at 10 pm., Disp: 90 tablet, Rfl: 1 .  Calcium-Phosphorus-Vitamin D (CALCIUM GUMMIES PO), Take 2 tablets by mouth daily., Disp: , Rfl:  .  ibuprofen (ADVIL,MOTRIN) 200 MG tablet, Take 600 mg by mouth every 6 (six) hours as needed (for pain.)., Disp: , Rfl:  .  irbesartan-hydrochlorothiazide (AVALIDE) 300-12.5 MG tablet, Take 1 tablet by mouth daily at 10 pm., Disp: 90 tablet, Rfl: 1 .  levothyroxine (SYNTHROID, LEVOTHROID) 100 MCG tablet, Take 1 tablet (100 mcg total) by mouth daily before breakfast., Disp: 90 tablet, Rfl: 1 .  Multiple Vitamin (MULTIVITAMIN WITH MINERALS) TABS tablet, Take 1 tablet by mouth daily. ALIVE MULTIVITAMIN, Disp: , Rfl:  .  Omega-3 Fatty Acids (FISH OIL) 1000 MG CAPS, Take 1,000 mg by mouth daily., Disp: , Rfl:  .  OZEMPIC 1 MG/DOSE SOPN, INJECT 1MG  INTO THE SKIN ONCE A WEEK, Disp: 2 pen, Rfl: 0  No Known Allergies   ROS  Constitutional: Negative for fever or weight change.  Respiratory: Positive  for a dry cough ( had a cold and just resolved, but cough still present )and shortness of breath.   Cardiovascular: Negative for chest pain or palpitations.  Gastrointestinal: Negative for abdominal pain, no bowel changes.  Musculoskeletal: Negative for gait problem or joint swelling.  Skin: Negative for rash.  Neurological: Negative for dizziness or headache.  No other specific complaints in a complete review of systems (except as listed in HPI above).   Objective  Vitals:   05/31/17 0902  BP: 122/74  Pulse: 73  Resp: 16  Temp: 98.1 F (36.7 C)  TempSrc: Oral  SpO2: 97%  Weight: 236 lb 8 oz (107.3 kg)  Height: 5\' 6"  (1.676 m)    Body mass index is 38.17 kg/m.  Physical Exam  Constitutional: Patient appears well-developed and well-nourished. Obese No distress.  HEENT: head atraumatic, normocephalic, pupils equal and  reactive to light, neck supple, throat within normal limits, s/p thyroidectomy  Cardiovascular: Normal rate, regular rhythm and normal heart sounds. No murmur heard. Trace  BLE edema. Pulmonary/Chest: Effort normal and breath sounds normal. No respiratory distress. Abdominal: Soft. There is no tenderness. Psychiatric: Patient has a normal mood and affect. behavior is normal. Judgment and thought content normal. Muscular Skeletal:had surgery of left thumb, well healed, index finger of right hand partially amputated  Recent Results (from the past 2160 hour(s))  POCT HgB A1C     Status: None   Collection Time: 05/31/17  9:05 AM  Result Value Ref Range   Hemoglobin A1C 6.5      PHQ2/9: Depression screen [facility] 2/9  05/31/2017 11/23/2016 09/05/2016 08/03/2016 06/03/2016  Decreased Interest 0 0 0 0 0  Down, Depressed, Hopeless 0 0 0 0 0  PHQ - 2 Score 0 0 0 0 0     Fall Risk: Fall Risk  05/31/2017 11/23/2016 09/05/2016 08/03/2016 06/03/2016  Falls in the past year? No No No No No     Functional Status Survey: Is the patient deaf or have difficulty hearing?: No Does the patient have difficulty seeing, even when wearing glasses/contacts?: No Does the patient have difficulty concentrating, remembering, or making decisions?: No Does the patient have difficulty walking or climbing stairs?: No Does the patient have difficulty dressing or bathing?: No Does the patient have difficulty doing errands alone such as visiting a doctor's office or shopping?: No    Assessment & Plan  1. 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 Hypothyroidism, postop  - TSH  3. Dyslipidemia  - Lipid panel  4. Obesity (BMI 30-39.9)  Discussed with the patient the risk posed by an increased BMI. Discussed importance of portion control, calorie counting and at least 150 minutes of physical activity weekly. Avoid sweet beverages and  drink more water. Eat at least 6 servings of fruit and vegetables daily  She has been walking 3 miles twice weekly, on Ozempic and also changed her diet, doing well.   5. Benign essential HTN  - irbesartan-hydrochlorothiazide (AVALIDE) 300-12.5 MG tablet; Take 1 tablet by mouth daily at 10 pm.  Dispense: 90 tablet; Refill: 1 - COMPLETE METABOLIC PANEL WITH GFR  6. Diabetic nephropathy associated with type 2 diabetes mellitus ([place])   7. Trigger thumb of right hand  Seeing Dr. Marry Guan   8. Carpal tunnel syndrome, unspecified laterality  Doing well now, no symptoms, may stop gabapentin

## 2017-06-05 LAB — COMPLETE METABOLIC PANEL WITH GFR
ALT: 19 U/L (ref 6–29)
AST: 17 U/L (ref 10–35)
Albumin: 4 g/dL (ref 3.6–5.1)
Alkaline Phosphatase: 79 U/L (ref 33–130)
BUN: 16 mg/dL (ref 7–25)
CALCIUM: 9.2 mg/dL (ref 8.6–10.4)
CHLORIDE: 103 mmol/L (ref 98–110)
CO2: 26 mmol/L (ref 20–31)
Creat: 1.1 mg/dL — ABNORMAL HIGH (ref 0.50–1.05)
GFR, Est African American: 65 mL/min (ref >=60)
GFR, Est Non African American: 57 mL/min — ABNORMAL LOW (ref >=60)
Glucose, Bld: 86 mg/dL (ref 65–99)
POTASSIUM: 3.8 mmol/L (ref 3.5–5.3)
Sodium: 140 mmol/L (ref 135–146)
Total Bilirubin: 0.6 mg/dL (ref 0.2–1.2)
Total Protein: 7.1 g/dL (ref 6.1–8.1)

## 2017-06-05 LAB — LIPID PANEL
CHOL/HDL RATIO: 1.4 ratio (ref <5.0)
CHOLESTEROL: 132 mg/dL (ref <200)
HDL: 94 mg/dL (ref >50)
LDL Cholesterol: 29 mg/dL (ref <100)
Triglycerides: 46 mg/dL (ref <150)
VLDL: 9 mg/dL (ref <30)

## 2017-06-05 LAB — TSH: TSH: 1.06 mIU/L

## 2017-06-20 ENCOUNTER — Other Ambulatory Visit: Payer: Self-pay | Admitting: Family Medicine

## 2017-06-20 DIAGNOSIS — G5600 Carpal tunnel syndrome, unspecified upper limb: Principal | ICD-10-CM

## 2017-06-20 DIAGNOSIS — E1141 Type 2 diabetes mellitus with diabetic mononeuropathy: Secondary | ICD-10-CM

## 2017-06-20 NOTE — Telephone Encounter (Signed)
Patient requesting refill of Ozempic to [facility].

## 2017-06-23 ENCOUNTER — Telehealth: Payer: Self-pay | Admitting: Family Medicine

## 2017-06-23 NOTE — Telephone Encounter (Signed)
Patient states she is completely out of the Ozempic and would like to know should she wait on Dr. Ancil Boozer to switch her to another medication? Or is it ok to miss the Sunday shot? Please advise.

## 2017-06-23 NOTE — Telephone Encounter (Signed)
Requesting return call. Would like to know if it is okay for her to miss the shot that she is suppose to take on Sunday.  [place]

## 2017-06-23 NOTE — Telephone Encounter (Signed)
Please provide 1 week sample to patient until Dr. Ancil Boozer is able to review patient's records and determine best course of care. Thank you so much!

## 2017-06-23 NOTE — Telephone Encounter (Signed)
I would call drug rep, [employer] that represents Ozempic to find out why. I can try Trulicity if not covered?

## 2017-06-23 NOTE — Telephone Encounter (Signed)
Pt came by yesterday and picked up her copay card it took off over $200, however she would still have to pay $600. Everything the pharmacy suggested to the patient would cost over $100. Pt would like to know is there something different you can prescribe

## 2017-06-26 ENCOUNTER — Other Ambulatory Visit: Payer: Self-pay | Admitting: Family Medicine

## 2017-06-26 ENCOUNTER — Ambulatory Visit
Admission: RE | Admit: 2017-06-26 | Discharge: 2017-06-26 | Disposition: A | Discharge status: Home / Self Care | Payer: BLUE CROSS/BLUE SHIELD | Source: Ambulatory Visit | Attending: Family Medicine | Admitting: Family Medicine

## 2017-06-26 ENCOUNTER — Telehealth: Payer: Self-pay | Admitting: Family Medicine

## 2017-06-26 DIAGNOSIS — Z1231 Encounter for screening mammogram for malignant neoplasm of breast: Secondary | ICD-10-CM | POA: Diagnosis not present

## 2017-06-26 MED ORDER — METFORMIN HCL 500 MG PO TABS: 500.0000 mg | ORAL_TABLET | Freq: Two times a day (BID) | ORAL | 3 refills | Status: DC

## 2017-06-26 NOTE — Telephone Encounter (Signed)
PT SAID THAT SHE DID NOT HAVE TIME TO WAIT AND ASKED THAT YOU CALL HE AT [phone] ABOUT THIS ISSUE WITH HER MEDICATION.

## 2017-06-26 NOTE — Telephone Encounter (Signed)
I filled Metformin also , just in case

## 2017-06-26 NOTE — Telephone Encounter (Signed)
Patient was informed that Ozempic is covered and does not require a PA but the code that was given to the [employer] rep stated that she has a deductible or a co-insurance that she has to deal with.  Patient was instructed to give her insurance a call to see what she has to do in order to get it filled.

## 2017-06-27 ENCOUNTER — Other Ambulatory Visit: Payer: Self-pay | Admitting: Family Medicine

## 2017-06-27 DIAGNOSIS — R928 Other abnormal and inconclusive findings on diagnostic imaging of breast: Secondary | ICD-10-CM

## 2017-06-27 DIAGNOSIS — N6310 Unspecified lump in the right breast, unspecified quadrant: Secondary | ICD-10-CM

## 2017-07-05 ENCOUNTER — Ambulatory Visit
Admission: RE | Admit: 2017-07-05 | Discharge: 2017-07-05 | Disposition: A | Discharge status: Home / Self Care | Payer: BLUE CROSS/BLUE SHIELD | Source: Ambulatory Visit | Attending: Family Medicine | Admitting: Family Medicine

## 2017-07-05 DIAGNOSIS — R928 Other abnormal and inconclusive findings on diagnostic imaging of breast: Secondary | ICD-10-CM | POA: Diagnosis not present

## 2017-07-05 DIAGNOSIS — N6310 Unspecified lump in the right breast, unspecified quadrant: Secondary | ICD-10-CM | POA: Diagnosis present

## 2017-09-01 ENCOUNTER — Ambulatory Visit (INDEPENDENT_AMBULATORY_CARE_PROVIDER_SITE_OTHER): Payer: BLUE CROSS/BLUE SHIELD | Admitting: Family Medicine

## 2017-09-01 ENCOUNTER — Encounter: Payer: Self-pay | Admitting: Family Medicine

## 2017-09-01 VITALS — BP 128/74 | HR 75 | Temp 97.9°F | Resp 16 | Ht 66.0 in | Wt 237.0 lb

## 2017-09-01 DIAGNOSIS — E785 Hyperlipidemia, unspecified: Secondary | ICD-10-CM

## 2017-09-01 DIAGNOSIS — E1141 Type 2 diabetes mellitus with diabetic mononeuropathy: Secondary | ICD-10-CM | POA: Diagnosis not present

## 2017-09-01 DIAGNOSIS — I10 Essential (primary) hypertension: Secondary | ICD-10-CM

## 2017-09-01 DIAGNOSIS — E890 Postprocedural hypothyroidism: Secondary | ICD-10-CM | POA: Diagnosis not present

## 2017-09-01 DIAGNOSIS — E669 Obesity, unspecified: Secondary | ICD-10-CM

## 2017-09-01 DIAGNOSIS — G5600 Carpal tunnel syndrome, unspecified upper limb: Secondary | ICD-10-CM

## 2017-09-01 DIAGNOSIS — E1121 Type 2 diabetes mellitus with diabetic nephropathy: Secondary | ICD-10-CM

## 2017-09-01 DIAGNOSIS — R928 Other abnormal and inconclusive findings on diagnostic imaging of breast: Secondary | ICD-10-CM | POA: Diagnosis not present

## 2017-09-01 DIAGNOSIS — Z23 Encounter for immunization: Secondary | ICD-10-CM | POA: Diagnosis not present

## 2017-09-01 LAB — POCT UA - MICROALBUMIN: Microalbumin Ur, POC: NEGATIVE mg/L

## 2017-09-01 LAB — POCT GLYCOSYLATED HEMOGLOBIN (HGB A1C): Hemoglobin A1C: 7.1

## 2017-09-01 MED ORDER — SEMAGLUTIDE(0.25 OR 0.5MG/DOS) 2 MG/1.5ML ~~LOC~~ SOPN: 0.5000 mg | PEN_INJECTOR | SUBCUTANEOUS | 2 refills | Status: DC

## 2017-09-01 MED ORDER — IRBESARTAN-HYDROCHLOROTHIAZIDE 300-12.5 MG PO TABS: 1.0000 | ORAL_TABLET | Freq: Every day | ORAL | 1 refills | Status: DC

## 2017-09-01 MED ORDER — ATORVASTATIN CALCIUM 40 MG PO TABS: 40.0000 mg | ORAL_TABLET | Freq: Every day | ORAL | 1 refills | Status: DC

## 2017-09-01 MED ORDER — LEVOTHYROXINE SODIUM 100 MCG PO TABS: 100.0000 ug | ORAL_TABLET | Freq: Every day | ORAL | 1 refills | Status: DC

## 2017-09-01 MED ORDER — METFORMIN HCL 500 MG PO TABS: 500.0000 mg | ORAL_TABLET | Freq: Two times a day (BID) | ORAL | 1 refills | Status: DC

## 2017-09-01 MED ORDER — SEMAGLUTIDE(0.25 OR 0.5MG/DOS) 2 MG/1.5ML ~~LOC~~ SOPN
0.5000 mg | PEN_INJECTOR | SUBCUTANEOUS | 2 refills | Status: DC
Start: 2017-09-01 — End: 2017-09-01

## 2017-09-01 NOTE — Addendum Note (Signed)
Addended by: Inda Coke on: 09/01/2017 09:21 AM   Modules accepted: Orders

## 2017-09-01 NOTE — Progress Notes (Signed)
Name: Samantha Copeland   MRN: [id number]    DOB: 09/02/1961   Date:09/01/2017       Progress Note  Subjective  Chief Complaint  Chief Complaint  Patient presents with  . Medication Refill  . Diabetes    Checks twice daily, Low-80's Highest-135  . Hypertension  . Hyperlipidemia  . Hypothyroidism  . 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 Symptoms of carpal tunnel  and feet tingling has resolved - so we stopped Gabapentin - she is doing well. Continue life style modification. She is following a diabetic diet, eating fish twice weekly, walking twice weekly for 3 miles. Urine micro normal today . She is on ARB. She is back on Metformin and off Ozempic since July 2018 because of coverage problems, hgbA1C is up to 7.1%, she denies polyphagia, polydipsia or polyuria, but upset about not being at goal. We will try Ozempic again and if not covered switch back to Trulicity  HTN: bp is at goal, denies side effects of medications. Compliant with medication . No chest pain or SOB or palpitation, or orthostatic changes. ARB is working well for her.   Hyperlipidemia: she is on Atorvastatin daily. Discussed ways to increase HDL, she has been eating more fish and also tree nuts.  Obesity: changed diet and has been on Trulicity since June 5656 and switched to [facility] March 2018. , weight improved and is stable even thought she is off Ozempic since 06/2017   Goiter: seeing Dr. Richardson Landry had total thyroidectomy in December 2016, doing well, on Synthroid 100 mcg daily and has been released by Dr. Gabriel Carina. No tachycardia, constipation has resolved with dietary modification - eating more vegetables, less carbohydrates .  Patient Active Problem List    Diagnosis Date Noted  . Trigger thumb of right hand 05/31/2017  . Status post de Quervain's release surgery 03/05/2017  . Hypothyroidism, postop 03/14/2016  . Benign essential HTN 05/30/2015  . Carpal tunnel syndrome 05/30/2015  . Dyslipidemia 05/30/2015  . 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 History of thyroidectomy, total 05/30/2015  . Microalbuminuria 05/30/2015  . Obesity (BMI 30-39.9) 05/30/2015  . Diabetes mellitus with renal manifestation ([place]) 05/30/2015    Past Surgical History:  Procedure Laterality Date  . ABDOMINAL HYSTERECTOMY    . 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 BREAST EXCISIONAL BIOPSY Right    benign  . DORSAL COMPARTMENT RELEASE Left 01/18/2017   Procedure: RELEASE DORSAL COMPARTMENT (DEQUERVAIN);  Surgeon: Dereck Leep, MD;  Location: [facility] ORS;  Service: Orthopedics;  Laterality: Left;  . FINE NEEDLE ASPIRATION  10/01/2013   Thyroid-Dr. Gabriel Carina  . THYROIDECTOMY N/A 12/09/2015   Procedure: THYROIDECTOMY;  Surgeon: Clyde Canterbury, MD;  Location: [facility] ORS;  Service: ENT;  Laterality: N/A;    Family History  Problem Relation Age of Onset  . Diabetes Mother   . Diabetes Father   . Hypertension Father   . Hyperlipidemia Father   . Cancer Father   . Diabetes Sister   . Breast cancer Neg Hx     Social History   Social History  . Marital status: Married    Spouse  name: Orpah Greek  . Number of children: 3  . Years of education: N/A   Occupational History  . 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 Smoking status: Never Smoker  . Smokeless tobacco: Never Used  . Alcohol use 0.0 oz/week     Comment: occ.  . Drug use: No  . Sexual activity: Yes    Partners: Male   Other Topics Concern  . Not on file   Social History Narrative   Lives with husband, youngest daughter and her grandson     Current Outpatient Prescriptions:  .  acetaminophen (TYLENOL) 325 MG  tablet, Take 650 mg by mouth every 6 (six) hours as needed., Disp: , Rfl:  .  aspirin EC 81 MG tablet, Take 81 mg by mouth every evening. , Disp: , Rfl:  .  atorvastatin (LIPITOR) 40 MG tablet, Take 1 tablet (40 mg total) by mouth daily at 10 pm., Disp: 90 tablet, Rfl: 1 .  Calcium-Phosphorus-Vitamin D (CALCIUM GUMMIES PO), Take 2 tablets by mouth daily., Disp: , Rfl:  .  ibuprofen (ADVIL,MOTRIN) 200 MG tablet, Take 600 mg by mouth every 6 (six) hours as needed (for pain.)., Disp: , Rfl:  .  irbesartan-hydrochlorothiazide (AVALIDE) 300-12.5 MG tablet, Take 1 tablet by mouth daily at 10 pm., Disp: 90 tablet, Rfl: 1 .  levothyroxine (SYNTHROID, LEVOTHROID) 100 MCG tablet, Take 1 tablet (100 mcg total) by mouth daily before breakfast., Disp: 90 tablet, Rfl: 1 .  metFORMIN (GLUCOPHAGE) 500 MG tablet, Take 1 tablet (500 mg total) by mouth 2 (two) times daily with a meal., Disp: 180 tablet, Rfl: 1 .  Multiple Vitamin (MULTIVITAMIN WITH MINERALS) TABS tablet, Take 1 tablet by mouth daily. ALIVE MULTIVITAMIN, Disp: , Rfl:  .  Omega-3 Fatty Acids (FISH OIL) 1000 MG CAPS, Take 1,000 mg by mouth daily., Disp: , Rfl:  .  Semaglutide (OZEMPIC) 0.25 or 0.5 MG/DOSE SOPN, Inject 0.5 mg into the skin once a week., Disp: 3 mL, Rfl: 2  No Known Allergies   ROS  Constitutional: Negative for fever or weight change.  Respiratory: Negative for cough and shortness of breath.   Cardiovascular: Negative for chest pain or palpitations.  Gastrointestinal: Negative for abdominal pain, no bowel changes.  Musculoskeletal: Negative for gait problem or joint swelling.  Skin: Negative for rash.  Neurological: Negative for dizziness or headache.  No other specific complaints in a complete review of systems (except as listed in HPI above).  Objective  Vitals:   09/01/17 0859  BP: 128/74  Pulse: 75  Resp: 16  Temp: 97.9 F (36.6 C)  TempSrc: Oral  SpO2: 96%  Weight: 237 lb (107.5 kg)  Height: 5\' 6"  (1.676 m)     Body mass index is 38.25 kg/m.  Physical Exam  Constitutional: Patient appears well-developed and well-nourished. Obese No distress.  HEENT: head atraumatic, normocephalic, pupils equal and reactive to light,  neck supple, throat within normal limits Cardiovascular: Normal rate, regular rhythm and normal heart sounds.  No murmur heard. No BLE edema. Pulmonary/Chest: Effort normal and breath sounds normal. No respiratory distress. Abdominal: Soft.  There is no tenderness. Psychiatric: Patient has a normal mood and affect. behavior is normal. Judgment and thought content normal.  Recent Results (from the past 2160 hour(s))  COMPLETE METABOLIC PANEL WITH GFR     Status: Abnormal   Collection Time: 06/05/17  9:11 AM  Result Value Ref Range   Sodium 140 135 - 146 mmol/L  Potassium 3.8 3.5 - 5.3 mmol/L   Chloride 103 98 - 110 mmol/L   CO2 26 20 - 31 mmol/L   Glucose, Bld 86 65 - 99 mg/dL   BUN 16 7 - 25 mg/dL   Creat 1.10 (H) 0.50 - 1.05 mg/dL    Comment:   For patients > or = 56 years of age: The upper reference limit for Creatinine is approximately 13% higher for people identified as African-American.      Total Bilirubin 0.6 0.2 - 1.2 mg/dL   Alkaline Phosphatase 79 33 - 130 U/L   AST 17 10 - 35 U/L   ALT 19 6 - 29 U/L   Total Protein 7.1 6.1 - 8.1 g/dL   Albumin 4.0 3.6 - 5.1 g/dL   Calcium 9.2 8.6 - 10.4 mg/dL   GFR, Est African American 65 >=60 mL/min   GFR, Est Non African American 57 (L) >=60 mL/min  Lipid panel     Status: None   Collection Time: 06/05/17  9:11 AM  Result Value Ref Range   Cholesterol 132 <200 mg/dL   Triglycerides 46 <150 mg/dL   HDL 94 >50 mg/dL   Total CHOL/HDL Ratio 1.4 <5.0 Ratio   VLDL 9 <30 mg/dL   LDL Cholesterol 29 <100 mg/dL  TSH     Status: None   Collection Time: 06/05/17  9:11 AM  Result Value Ref Range   TSH 1.06 mIU/L    Comment:   Reference Range   > or = 20 Years  0.40-4.50   Pregnancy Range First trimester   0.26-2.66 Second trimester 0.55-2.73 Third trimester  0.43-2.91     POCT HgB A1C     Status: Abnormal   Collection Time: 09/01/17  9:02 AM  Result Value Ref Range   Hemoglobin A1C 7.1   POCT UA - Microalbumin     Status: None   Collection Time: 09/01/17  9:02 AM  Result Value Ref Range   Microalbumin Ur, POC negative mg/L   Creatinine, POC  mg/dL   Albumin/Creatinine Ratio, Urine, POC      Diabetic Foot Exam: Diabetic Foot Exam - Simple   Simple Foot Form Diabetic Foot exam was performed with the following findings:  Yes 09/01/2017  9:11 AM  Visual Inspection No deformities, no ulcerations, no other skin breakdown bilaterally:  Yes Sensation Testing Intact to touch and monofilament testing bilaterally:  Yes Pulse Check Posterior Tibialis and Dorsalis pulse intact bilaterally:  Yes Comments Dry feet      PHQ2/9: Depression screen [facility] 2/9 09/01/2017 05/31/2017 11/23/2016 09/05/2016 08/03/2016  Decreased Interest 0 0 0 0 0  Down, Depressed, Hopeless 0 0 0 0 0  PHQ - 2 Score 0 0 0 0 0    Fall Risk: Fall Risk  09/01/2017 05/31/2017 11/23/2016 09/05/2016 08/03/2016  Falls in the past year? No No No No No    Functional Status Survey: Is the patient deaf or have difficulty hearing?: No Does the patient have difficulty seeing, even when wearing glasses/contacts?: No Does the patient have difficulty concentrating, remembering, or making decisions?: No Does the patient have difficulty walking or climbing stairs?: No Does the patient have difficulty dressing or bathing?: No Does the patient have difficulty doing errands alone such as visiting a doctor's office or shopping?: No    Assessment & Plan  1. 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  Dispense: 3 mL; Refill: 2  2. Needs flu shot  - Flu Vaccine QUAD 6+ mos PF IM  (Fluarix Quad PF)  3. Hypothyroidism, postop  Continue synthroid  4. Dyslipidemia  - atorvastatin (LIPITOR) 40 MG tablet; Take 1 tablet (40 mg total) by mouth daily at 10 pm.  Dispense: 90 tablet; Refill: 1  5. Obesity (BMI 30-39.9)  Discussed with the patient the risk posed by an increased BMI. Discussed importance of portion control, calorie counting and at least 150 minutes of physical activity weekly. Avoid sweet beverages and drink more water. Eat at least 6 servings of fruit and vegetables daily   6. Benign essential HTN  - irbesartan-hydrochlorothiazide (AVALIDE) 300-12.5 MG tablet; Take 1 tablet by mouth daily at 10 pm.  Dispense: 90 tablet; Refill: 1  7. Diabetic nephropathy associated with type 2 diabetes mellitus (HCC)  - Semaglutide (OZEMPIC) 0.25 or 0.5 MG/DOSE SOPN; Inject 0.5 mg into the skin once a week.  Dispense: 3 mL; Refill: 2  8. History of thyroidectomy, total  - levothyroxine (SYNTHROID, LEVOTHROID) 100 MCG tablet; Take 1 tablet (100 mcg total) by mouth daily before breakfast.  Dispense: 90 tablet; Refill: 1   9. Abnormal mammogram of right breast  We will order repeat 12/2016

## 2017-09-05 ENCOUNTER — Other Ambulatory Visit: Payer: Self-pay

## 2017-09-05 DIAGNOSIS — E890 Postprocedural hypothyroidism: Secondary | ICD-10-CM

## 2017-09-05 MED ORDER — LEVOTHYROXINE SODIUM 100 MCG PO TABS: 100.0000 ug | ORAL_TABLET | Freq: Every day | ORAL | 1 refills | Status: DC

## 2017-12-01 ENCOUNTER — Ambulatory Visit (INDEPENDENT_AMBULATORY_CARE_PROVIDER_SITE_OTHER): Payer: BLUE CROSS/BLUE SHIELD | Admitting: Family Medicine

## 2017-12-01 ENCOUNTER — Encounter: Payer: Self-pay | Admitting: Family Medicine

## 2017-12-01 VITALS — BP 110/62 | HR 73 | Temp 97.8°F | Resp 16 | Ht 66.0 in | Wt 239.9 lb

## 2017-12-01 DIAGNOSIS — E1121 Type 2 diabetes mellitus with diabetic nephropathy: Secondary | ICD-10-CM | POA: Diagnosis not present

## 2017-12-01 DIAGNOSIS — I10 Essential (primary) hypertension: Secondary | ICD-10-CM

## 2017-12-01 DIAGNOSIS — E669 Obesity, unspecified: Secondary | ICD-10-CM

## 2017-12-01 DIAGNOSIS — E890 Postprocedural hypothyroidism: Secondary | ICD-10-CM | POA: Diagnosis not present

## 2017-12-01 DIAGNOSIS — E1141 Type 2 diabetes mellitus with diabetic mononeuropathy: Secondary | ICD-10-CM | POA: Diagnosis not present

## 2017-12-01 DIAGNOSIS — G5600 Carpal tunnel syndrome, unspecified upper limb: Secondary | ICD-10-CM | POA: Diagnosis not present

## 2017-12-01 LAB — POCT GLYCOSYLATED HEMOGLOBIN (HGB A1C): Hemoglobin A1C: 6.5

## 2017-12-01 MED ORDER — TRULICITY 1.5 MG/0.5ML ~~LOC~~ SOAJ: 1.0000 | SUBCUTANEOUS | 1 refills | Status: DC

## 2017-12-01 NOTE — Progress Notes (Signed)
Name: Samantha Copeland   MRN: [id number]    DOB: Aug 16, 1961   Date:12/01/2017       Progress Note  Subjective  Chief Complaint  Chief Complaint  Patient presents with  . Medication Refill    3 month F/U  . Diabetes    Checks twice daily, Lowest- 78 Average-90's Highest-140. Patient states Ozempic is not covered so she went back to Trulicity and is doing well.  . Hypertension    Denies any symptoms  . Hypothyroidism  . 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 We will continue current regiment. She denies hypoglycemic episodes, she denies polyphagia, polydipsia or polyuria. . Symptoms of carpal tunnel and feet tingling has resolved - so we stopped Gabapentin - she is doing well. Continue life style modification. She is following a diabetic diet, eating fish twice weekly, she is only walking at work lately. Urine micro normal today . She is on ARB.   HTN: bp is towards low end of normal, however no dizziness, denies side effects of medications. Compliant with medication . No chest pain or SOB or palpitation, or orthostatic changes. ARB is working well for her.   Hyperlipidemia: she is on Atorvastatin daily. Discussed ways to increase HDL, she has been eating more fish and also tree nuts.  Obesity: stable, she needs to resume physical activity.   Goiter: seeing Dr. Richardson Landry had total thyroidectomy in December 2016, doing well, on Synthroid 100 mcg daily and has been released by Dr. Gabriel Carina. No tachycardia, constipation has resolved with dietary modification     Patient Active Problem List   Diagnosis Date Noted  . Trigger thumb of right hand 05/31/2017  . Status post de Quervain's release surgery 03/05/2017  . Hypothyroidism, postop 03/14/2016  . Benign essential HTN 05/30/2015  . Carpal tunnel syndrome 05/30/2015  . Dyslipidemia 05/30/2015  . 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 History of thyroidectomy, total 05/30/2015  . Microalbuminuria 05/30/2015  . Obesity (BMI 30-39.9) 05/30/2015  . Diabetes mellitus with renal manifestation ([place]) 05/30/2015    Past Surgical History:  Procedure Laterality Date  . ABDOMINAL HYSTERECTOMY    . 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 BREAST EXCISIONAL BIOPSY Right    benign  . DORSAL COMPARTMENT RELEASE Left 01/18/2017   Procedure: RELEASE DORSAL COMPARTMENT (DEQUERVAIN);  Surgeon: Dereck Leep, MD;  Location: [facility] ORS;  Service: Orthopedics;  Laterality: Left;  . FINE NEEDLE ASPIRATION  10/01/2013   Thyroid-Dr. Gabriel Carina  . THYROIDECTOMY N/A 12/09/2015   Procedure: THYROIDECTOMY;  Surgeon: Clyde Canterbury, MD;  Location: [facility] ORS;  Service: ENT;  Laterality: N/A;    Family History  Problem Relation Age of Onset  . Diabetes Mother   . Diabetes Father   . Hypertension Father   . Hyperlipidemia Father   . Cancer Father   . Diabetes Sister   . Breast cancer Neg Hx     Social History   Socioeconomic History  .  Marital status: Married    Spouse name: Orpah Greek  . Number of children: 3  . Years of education: Not on file  . Highest education level: Not on file  Social Needs  . Financial resource strain: Not on file  . Food insecurity - worry: Not on file  . Food insecurity - inability: Not on file  . Transportation needs - medical: Not on file  . Transportation needs - non-medical: Not on file  Occupational History  . 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 Smoking status: Never  Smoker  . Smokeless tobacco: Never Used  Substance and Sexual Activity  . Alcohol use: Yes    Alcohol/week: 0.0 oz    Comment: occ.  . Drug use: No  . Sexual activity: Yes    Partners: Male  Other Topics Concern  . Not on file  Social History Narrative   Lives with husband, youngest daughter and her grandson     Current Outpatient Medications:  .  acetaminophen (TYLENOL) 325 MG tablet, Take 650 mg by mouth every 6 (six) hours as needed., Disp: , Rfl:  .  aspirin EC 81 MG tablet, Take 81 mg by mouth every evening. , Disp: , Rfl:  .  atorvastatin (LIPITOR) 40 MG tablet, Take 1 tablet (40 mg total) by mouth daily at 10 pm., Disp: 90 tablet, Rfl: 1 .  ibuprofen (ADVIL,MOTRIN) 200 MG tablet, Take 600 mg by mouth every 6 (six) hours as needed (for pain.)., Disp: , Rfl:  .  irbesartan-hydrochlorothiazide (AVALIDE) 300-12.5 MG tablet, Take 1 tablet by mouth daily at 10 pm., Disp: 90 tablet, Rfl: 1 .  levothyroxine (SYNTHROID, LEVOTHROID) 100 MCG tablet, Take 1 tablet (100 mcg total) by mouth daily before breakfast., Disp: 90 tablet, Rfl: 1 .  metFORMIN (GLUCOPHAGE) 500 MG tablet, Take 1 tablet (500 mg total) by mouth 2 (two) times daily with a meal., Disp: 180 tablet, Rfl: 1 .  TRULICITY 1.5 AC/1.6SA SOPN, Inject 1 Dose as directed once a week., Disp: , Rfl:  .  Calcium-Phosphorus-Vitamin D (CALCIUM GUMMIES PO), Take 2 tablets by mouth daily., Disp: , Rfl:  .  Multiple Vitamin (MULTIVITAMIN WITH MINERALS) TABS tablet, Take 1 tablet by mouth daily. ALIVE MULTIVITAMIN, Disp: , Rfl:  .  Omega-3 Fatty Acids (FISH OIL) 1000 MG CAPS, Take 1,000 mg by mouth daily., Disp: , Rfl:   No Known Allergies   ROS  Constitutional: Negative for fever or weight change.  Respiratory: Negative for cough and shortness of breath.   Cardiovascular: Negative for chest pain or palpitations.  Gastrointestinal: Negative for abdominal pain, no bowel changes.  Musculoskeletal: Negative for gait problem or joint  swelling.  Skin: Negative for rash.  Neurological: Negative for dizziness or headache.  No other specific complaints in a complete review of systems (except as listed in HPI above).  Objective  Vitals:   12/01/17 0853  BP: 110/62  Pulse: 73  Resp: 16  Temp: 97.8 F (36.6 C)  TempSrc: Oral  SpO2: 97%  Weight: 239 lb 14.4 oz (108.8 kg)  Height: 5\' 6"  (1.676 m)    Body mass index is 38.72 kg/m.  Physical Exam  Constitutional: Patient appears well-developed and well-nourished. Obese No distress.  HEENT: head atraumatic, normocephalic, pupils equal and reactive to light,  neck supple, throat within normal limits Cardiovascular: Normal rate, regular rhythm and normal heart sounds.  No murmur heard. No BLE edema. Pulmonary/Chest: Effort normal and  breath sounds normal. No respiratory distress. Abdominal: Soft.  There is no tenderness. Psychiatric: Patient has a normal mood and affect. behavior is normal. Judgment and thought content normal.  Recent Results (from the past 2160 hour(s))  POCT HgB A1C     Status: None   Collection Time: 12/01/17  8:59 AM  Result Value Ref Range   Hemoglobin A1C 6.5      PHQ2/9: Depression screen [facility] 2/9 12/01/2017 09/01/2017 05/31/2017 11/23/2016 09/05/2016  Decreased Interest 0 0 0 0 0  Down, Depressed, Hopeless 0 0 0 0 0  PHQ - 2 Score 0 0 0 0 0     Fall Risk: Fall Risk  12/01/2017 09/01/2017 05/31/2017 11/23/2016 09/05/2016  Falls in the past year? No No No No No    Functional Status Survey: Is the patient deaf or have difficulty hearing?: No Does the patient have difficulty seeing, even when wearing glasses/contacts?: No Does the patient have difficulty concentrating, remembering, or making decisions?: No Does the patient have difficulty walking or climbing stairs?: No Does the patient have difficulty dressing or bathing?: No Does the patient have difficulty doing errands alone such as visiting a doctor's office or shopping?:  No    Assessment & Plan  1. 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 Hypothyroidism, postop  Continue current dose of synthroid  3. Obesity (BMI 30-39.9)  Discussed with the patient the risk posed by an increased BMI. Discussed importance of portion control, calorie counting and at least 150 minutes of physical activity weekly. Avoid sweet beverages and drink more water. Eat at least 6 servings of fruit and vegetables daily   4. Diabetic nephropathy associated with type 2 diabetes mellitus (HCC)  Continue Avapro   5. Benign essential HTN  Continue medication

## 2017-12-19 ENCOUNTER — Ambulatory Visit
Admission: RE | Admit: 2017-12-19 | Discharge: 2017-12-19 | Disposition: A | Discharge status: Home / Self Care | Payer: BLUE CROSS/BLUE SHIELD | Source: Ambulatory Visit | Attending: Family Medicine | Admitting: Family Medicine

## 2017-12-19 DIAGNOSIS — R928 Other abnormal and inconclusive findings on diagnostic imaging of breast: Secondary | ICD-10-CM | POA: Diagnosis not present

## 2017-12-19 DIAGNOSIS — N6310 Unspecified lump in the right breast, unspecified quadrant: Secondary | ICD-10-CM | POA: Diagnosis not present

## 2017-12-21 ENCOUNTER — Telehealth: Payer: Self-pay | Admitting: Family Medicine

## 2017-12-21 ENCOUNTER — Other Ambulatory Visit: Payer: Self-pay | Admitting: Family Medicine

## 2017-12-21 DIAGNOSIS — E1129 Type 2 diabetes mellitus with other diabetic kidney complication: Secondary | ICD-10-CM

## 2017-12-21 DIAGNOSIS — E1121 Type 2 diabetes mellitus with diabetic nephropathy: Secondary | ICD-10-CM

## 2017-12-21 DIAGNOSIS — G5600 Carpal tunnel syndrome, unspecified upper limb: Principal | ICD-10-CM

## 2017-12-21 DIAGNOSIS — R809 Proteinuria, unspecified: Secondary | ICD-10-CM

## 2017-12-21 DIAGNOSIS — E1141 Type 2 diabetes mellitus with diabetic mononeuropathy: Secondary | ICD-10-CM

## 2017-12-21 NOTE — Telephone Encounter (Signed)
Please contact pt and let her know why medication was denied she was last seen on 12/01/17. Pt states she needs this by the weekend. First one was filled at [facility] and pt is wanting it switched to the [facility] that is listed in her chart.

## 2017-12-22 NOTE — Telephone Encounter (Signed)
Done

## 2017-12-29 ENCOUNTER — Encounter: Payer: Self-pay | Admitting: Family Medicine

## 2018-02-14 ENCOUNTER — Other Ambulatory Visit: Payer: Self-pay | Admitting: Family Medicine

## 2018-02-14 DIAGNOSIS — E1141 Type 2 diabetes mellitus with diabetic mononeuropathy: Secondary | ICD-10-CM

## 2018-02-14 DIAGNOSIS — G5600 Carpal tunnel syndrome, unspecified upper limb: Principal | ICD-10-CM

## 2018-02-14 NOTE — Telephone Encounter (Signed)
Request for diabetes medication. Trulicity   Last office visit pertaining to diabetes: 12/01/2017   Lab Results  Component Value Date   HGBA1C 6.5 12/01/2017    Follow up on 03/01/2018

## 2018-02-27 ENCOUNTER — Other Ambulatory Visit: Payer: Self-pay | Admitting: Family Medicine

## 2018-02-27 DIAGNOSIS — E890 Postprocedural hypothyroidism: Secondary | ICD-10-CM

## 2018-03-01 ENCOUNTER — Ambulatory Visit (INDEPENDENT_AMBULATORY_CARE_PROVIDER_SITE_OTHER): Payer: BLUE CROSS/BLUE SHIELD | Admitting: Family Medicine

## 2018-03-01 ENCOUNTER — Encounter: Payer: Self-pay | Admitting: Family Medicine

## 2018-03-01 VITALS — BP 114/67 | HR 75 | Temp 97.6°F | Resp 16 | Ht 67.75 in | Wt 235.6 lb

## 2018-03-01 DIAGNOSIS — E785 Hyperlipidemia, unspecified: Secondary | ICD-10-CM

## 2018-03-01 DIAGNOSIS — I10 Essential (primary) hypertension: Secondary | ICD-10-CM | POA: Diagnosis not present

## 2018-03-01 DIAGNOSIS — G5600 Carpal tunnel syndrome, unspecified upper limb: Secondary | ICD-10-CM

## 2018-03-01 DIAGNOSIS — E1141 Type 2 diabetes mellitus with diabetic mononeuropathy: Secondary | ICD-10-CM | POA: Diagnosis not present

## 2018-03-01 DIAGNOSIS — E890 Postprocedural hypothyroidism: Secondary | ICD-10-CM | POA: Diagnosis not present

## 2018-03-01 DIAGNOSIS — S68119S Complete traumatic metacarpophalangeal amputation of unspecified finger, sequela: Secondary | ICD-10-CM | POA: Diagnosis not present

## 2018-03-01 DIAGNOSIS — E669 Obesity, unspecified: Secondary | ICD-10-CM

## 2018-03-01 DIAGNOSIS — Z01419 Encounter for gynecological examination (general) (routine) without abnormal findings: Secondary | ICD-10-CM

## 2018-03-01 DIAGNOSIS — Z1231 Encounter for screening mammogram for malignant neoplasm of breast: Secondary | ICD-10-CM | POA: Diagnosis not present

## 2018-03-01 DIAGNOSIS — E1121 Type 2 diabetes mellitus with diabetic nephropathy: Secondary | ICD-10-CM

## 2018-03-01 DIAGNOSIS — R058 Other specified cough: Secondary | ICD-10-CM

## 2018-03-01 DIAGNOSIS — R05 Cough: Secondary | ICD-10-CM

## 2018-03-01 MED ORDER — ATORVASTATIN CALCIUM 40 MG PO TABS: 40.0000 mg | ORAL_TABLET | Freq: Every day | ORAL | 1 refills | Status: DC

## 2018-03-01 MED ORDER — DULAGLUTIDE 1.5 MG/0.5ML ~~LOC~~ SOAJ: 1.5000 mg | SUBCUTANEOUS | 1 refills | Status: DC

## 2018-03-01 MED ORDER — BENZONATATE 100 MG PO CAPS: 100.0000 mg | ORAL_CAPSULE | Freq: Two times a day (BID) | ORAL | 0 refills | Status: DC | PRN

## 2018-03-01 MED ORDER — BUDESONIDE-FORMOTEROL FUMARATE 160-4.5 MCG/ACT IN AERO: 2.0000 | INHALATION_SPRAY | Freq: Two times a day (BID) | RESPIRATORY_TRACT | 0 refills | Status: DC

## 2018-03-01 MED ORDER — IRBESARTAN-HYDROCHLOROTHIAZIDE 300-12.5 MG PO TABS: 1.0000 | ORAL_TABLET | Freq: Every day | ORAL | 1 refills | Status: DC

## 2018-03-01 MED ORDER — METFORMIN HCL 500 MG PO TABS: 500.0000 mg | ORAL_TABLET | Freq: Two times a day (BID) | ORAL | 1 refills | Status: DC

## 2018-03-01 MED ORDER — LEVOTHYROXINE SODIUM 100 MCG PO TABS: 100.0000 ug | ORAL_TABLET | Freq: Every day | ORAL | 1 refills | Status: DC

## 2018-03-01 NOTE — Progress Notes (Signed)
Name: Samantha Copeland   MRN: [id number]    DOB: Jan 18, 1961   Date:03/01/2018       Progress Note  Subjective  Chief Complaint  Chief Complaint  Patient presents with  . Annual Exam  . Medication Refill  . Diabetes    Checks sugar every morning, Low-73 Average-90's Highest-233  . Hypertension    Denies any symptoms  . Hyperlipidemia  . 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 She has changed her diet, lost 5 lbs since last visit, we will recheck hgbA1C today. She denies hypoglycemic episodes, she denies polyphagia, polydipsia or polyuria. . Symptoms of carpal tunnel and feet tingling has resolved -so we stopped Gabapentin - she is doing well. Continue life style modification. She is following a diabetic diet, eating fish twice weekly, she is only walking at work lately.Urine micro normal last visit . She is on ARB.  HTN: bp is towards low end of normal, however no dizziness, denies side effects of medications. Compliant with medication . No chest pain or SOB or palpitation, or orthostatic changes. ARB is working well for her.  Hyperlipidemia: she is on Atorvastatin daily. Discussed ways to increase HDL, she has been eating more fish and also tree nuts.  Obesity: she lost 5 lbs since last visit, following a healthy diet, eating wheat pasta now, more fruit and vegetables, and doing well.   Goiter: seeing Dr. Richardson Landry had total thyroidectomy in December 2016, doing well, on Synthroid 100 mcg daily and has been  released by Dr. Gabriel Carina. No tachycardia, constipation has resolved with dietary modification . We will recheck levels  Cough: she has an URI about one month ago and continues to have a dry cough, no wheezing, SOB or fever, but tired of coughing, during the day and night.    Diet: eating smaller portions and follows a diabetic diet, eating salads, fish, tree nuts.  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  Depression:  Depression screen [facility] 2/9 03/01/2018 12/01/2017 09/01/2017 05/31/2017 11/23/2016  Decreased Interest 0 0 0 0 0  Down, Depressed, Hopeless 0 0 0 0 0  PHQ - 2 Score 0 0 0 0 0   Hypertension: BP Readings from Last 3 Encounters:  03/01/18 114/67  12/01/17 110/62  09/01/17 128/74   Obesity: Wt Readings from Last 3 Encounters:  03/01/18 235 lb 9.6 oz (106.9 kg)  12/01/17 239 lb 14.4 oz (108.8 kg)  09/01/17 237 lb (107.5 kg)   BMI Readings from Last 3 Encounters:  03/01/18 36.09 kg/m  12/01/17 38.72 kg/m  09/01/17 38.25 kg/m     hep C: done STD testing and prevention (chl/gon/syphilis): N/A Intimate partner violence:negative screen  Sexual History/Pain during Intercourse:no pain during intercourse Menstrual History/LMP/Abnormal Bleeding: history of hysterectomy, no cervix Incontinence Symptoms: none  Advanced Care Planning: A voluntary discussion about advance care planning including the explanation and discussion of advance directives.  Discussed health care proxy and Living will, and the patient  was able to identify a health care proxy as husband.   Patient does not have a living will at present time.   Breast cancer:  HM Mammogram  Date Value Ref Range Status  05/20/2014 Normal  Final    BRCA gene screening: had repeat mammogram in January going back in July for bilateral Cervical cancer screening: N/A  Osteoporosis: wait until 57 yo    Lipids:  Lab Results  Component Value Date   CHOL 132 06/05/2017   CHOL 146  06/06/2016   CHOL 139 06/04/2015   Lab Results  Component Value Date   HDL 94 06/05/2017   HDL 99 06/06/2016   HDL 94 06/04/2015   Lab Results  Component Value Date   LDLCALC 29 06/05/2017   LDLCALC 38 06/06/2016   LDLCALC 35 06/04/2015   Lab Results  Component Value Date   TRIG 46 06/05/2017   TRIG 43 06/06/2016   TRIG 52 06/04/2015   Lab Results  Component Value Date   CHOLHDL 1.4 06/05/2017   CHOLHDL 1.5 06/06/2016   CHOLHDL 1.5 06/04/2015   No results found for: LDLDIRECT  Glucose:  Glucose  Date Value Ref Range Status  06/06/2016 87 65 - 99 mg/dL Final   Glucose, Bld  Date Value Ref Range Status  06/05/2017 86 65 - 99 mg/dL Final  01/10/2017 116 (H) 65 - 99 mg/dL Final  12/09/2015 93 65 - 99 mg/dL Final   Glucose-Capillary  Date Value Ref Range Status  01/18/2017 103 (H) 65 - 99 mg/dL Final  01/18/2017 109 (H) 65 - 99 mg/dL Final  12/10/2015 68 65 - 99 mg/dL Final     Colorectal cancer: in 2013 Lung cancer:  Low Dose CT Chest recommended if Age 74-80 years, 30 pack-year currently smoking OR have quit w/in 15years. Patient does not qualify.   Aspirin: discussed new studies, she wants to continue taking it for now ECG:01/10/2017   Patient Active Problem List   Diagnosis Date Noted  . Trigger thumb of right hand 05/31/2017  . Status post de Quervain's release surgery 03/05/2017  . Hypothyroidism, postop 03/14/2016  . Benign essential HTN 05/30/2015  . Carpal tunnel syndrome 05/30/2015  . Dyslipidemia 05/30/2015  . 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 History of thyroidectomy, total 05/30/2015  . Microalbuminuria 05/30/2015  . Obesity (BMI 30-39.9) 05/30/2015  . Diabetes mellitus with renal manifestation ([place]) 05/30/2015    Past Surgical History:  Procedure Laterality Date  . ABDOMINAL HYSTERECTOMY    . 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 BREAST EXCISIONAL BIOPSY Right     benign  . DORSAL COMPARTMENT RELEASE Left 01/18/2017   Procedure: RELEASE DORSAL COMPARTMENT (DEQUERVAIN);  Surgeon: Dereck Leep, MD;  Location: [facility] ORS;  Service: Orthopedics;  Laterality: Left;  . FINE NEEDLE ASPIRATION  10/01/2013   Thyroid-Dr. Gabriel Carina  . THYROIDECTOMY N/A 12/09/2015   Procedure: THYROIDECTOMY;  Surgeon: Clyde Canterbury, MD;  Location: [facility] ORS;  Service: ENT;  Laterality: N/A;    Family History  Problem Relation Age of Onset  . Diabetes Mother   . Diabetes Father   . Hypertension Father   . Hyperlipidemia Father   . Cancer Father   . Diabetes Sister   . Breast cancer Neg Hx     Social History   Socioeconomic History  . Marital status: Married    Spouse name: Orpah Greek  . Number of children: 3  . Years  of education: Not on file  . Highest education level: 12th grade  Occupational History  . 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 Financial resource strain: Not hard at all  . Food insecurity:    Worry: Never true    Inability: Never true  . Transportation needs:    Medical: No    Non-medical: No  Tobacco Use  . Smoking status: Never Smoker  . Smokeless tobacco: Never Used  Substance and Sexual Activity  . Alcohol use: Yes    Alcohol/week: 0.0 oz    Comment: occasionally  . Drug use: No  . Sexual activity: Yes    Partners: Male  Lifestyle  . Physical activity:    Days per week: 0 days    Minutes per session: 0 min  . Stress: Not at all  Relationships  . Social connections:    Talks on phone: More than three times a week    Gets together: Three times a week    Attends religious service: 1 to 4 times per year    Active member of club or organization: No    Attends meetings of clubs or organizations: Never    Relationship status: Married  . Intimate partner violence:    Fear of current or ex partner: No    Emotionally abused: No    Physically abused: No    Forced sexual activity: No  Other Topics  Concern  . Not on file  Social History Narrative   Lives with husband   Three grown children    One grandson      Current Outpatient Medications:  .  acetaminophen (TYLENOL) 325 MG tablet, Take 650 mg by mouth every 6 (six) hours as needed., Disp: , Rfl:  .  aspirin EC 81 MG tablet, Take 81 mg by mouth every evening. , Disp: , Rfl:  .  atorvastatin (LIPITOR) 40 MG tablet, Take 1 tablet (40 mg total) by mouth daily at 10 pm., Disp: 90 tablet, Rfl: 1 .  Dulaglutide (TRULICITY) 1.5 FF/6.3WG SOPN, Inject 1.5 mg into the skin once a week. Sundays, Disp: 12 pen, Rfl: 1 .  ibuprofen (ADVIL,MOTRIN) 200 MG tablet, Take 600 mg by mouth every 6 (six) hours as needed (for pain.)., Disp: , Rfl:  .  irbesartan-hydrochlorothiazide (AVALIDE) 300-12.5 MG tablet, Take 1 tablet by mouth daily at 10 pm., Disp: 90 tablet, Rfl: 1 .  levothyroxine (SYNTHROID, LEVOTHROID) 100 MCG tablet, Take 1 tablet (100 mcg total) by mouth daily before breakfast., Disp: 90 tablet, Rfl: 1 .  metFORMIN (GLUCOPHAGE) 500 MG tablet, Take 1 tablet (500 mg total) by mouth 2 (two) times daily with a meal., Disp: 180 tablet, Rfl: 1 .  benzonatate (TESSALON) 100 MG capsule, Take 1-2 capsules (100-200 mg total) by mouth 2 (two) times daily as needed., Disp: 40 capsule, Rfl: 0 .  budesonide-formoterol (SYMBICORT) 160-4.5 MCG/ACT inhaler, Inhale 2 puffs into the lungs 2 (two) times daily., Disp: 1 Inhaler, Rfl: 0 .  Calcium-Phosphorus-Vitamin D (CALCIUM GUMMIES PO), Take 2 tablets by mouth daily., Disp: , Rfl:  .  Multiple Vitamin (MULTIVITAMIN WITH MINERALS) TABS tablet, Take 1 tablet by mouth daily. ALIVE MULTIVITAMIN, Disp: , Rfl:  .  Omega-3 Fatty Acids (FISH OIL) 1000 MG CAPS, Take 1,000 mg by mouth daily., Disp: , Rfl:   No Known Allergies   ROS   Constitutional: Negative for fever or weight change.  Respiratory: Positive for cough but  shortness of breath.   Cardiovascular: Negative for chest pain or palpitations.   Gastrointestinal: Negative for abdominal pain, no bowel changes.  Musculoskeletal: Negative for gait problem or joint swelling.  Skin: Negative for rash.  Neurological: Negative for dizziness or headache.  No other specific complaints in a complete review of systems (except as listed in HPI above).   Objective  Vitals:   03/01/18 0829  BP: 114/67  Pulse: 75  Resp: 16  Temp: 97.6 F (36.4 C)  TempSrc: Oral  SpO2: 95%  Weight: 235 lb 9.6 oz (106.9 kg)  Height: 5' 7.75" (1.721 m)    Body mass index is 36.09 kg/m.  Physical Exam  Constitutional: Patient appears well-developed and obesityNo distress.  HENT: Head: Normocephalic and atraumatic. Ears: B TMs ok, no erythema or effusion; Nose: Nose normal. Mouth/Throat: Oropharynx is clear and moist. No oropharyngeal exudate.  Eyes: Conjunctivae and EOM are normal. Pupils are equal, round, and reactive to light. No scleral icterus.  Neck: Normal range of motion. Neck supple. No JVD present. No thyromegaly present.  Cardiovascular: Normal rate, regular rhythm and normal heart sounds.  No murmur heard. No BLE edema. Pulmonary/Chest: Effort normal and breath sounds normal. No respiratory distress. Abdominal: Soft. Bowel sounds are normal, no distension. There is no tenderness. no masses Breast: no lumps or masses, no nipple discharge or rashes FEMALE GENITALIA:  External genitalia normal External urethra normal Pelvic not done RECTAL:not done Musculoskeletal: Normal range of motion, no joint effusions. Partially amputated right index finger Neurological: he is alert and oriented to person, place, and time. No cranial nerve deficit. Coordination, balance, strength, speech and gait are normal.  Skin: Skin is warm and dry. No rash noted. No erythema.  Psychiatric: Patient has a normal mood and affect. behavior is normal. Judgment and thought content normal.   Recent Results (from the past 2160 hour(s))  POCT HgB A1C     Status: None    Collection Time: 12/01/17  8:59 AM  Result Value Ref Range   Hemoglobin A1C 6.5      PHQ2/9: Depression screen [facility] 2/9 03/01/2018 12/01/2017 09/01/2017 05/31/2017 11/23/2016  Decreased Interest 0 0 0 0 0  Down, Depressed, Hopeless 0 0 0 0 0  PHQ - 2 Score 0 0 0 0 0     Fall Risk: Fall Risk  03/01/2018 12/01/2017 09/01/2017 05/31/2017 11/23/2016  Falls in the past year? No No No No No     Functional Status Survey: Is the patient deaf or have difficulty hearing?: No Does the patient have difficulty seeing, even when wearing glasses/contacts?: No Does the patient have difficulty concentrating, remembering, or making decisions?: No Does the patient have difficulty walking or climbing stairs?: No Does the patient have difficulty dressing or bathing?: No Does the patient have difficulty doing errands alone such as visiting a doctor's office or shopping?: No   Assessment & Plan  1. Well woman exam  Discussed importance of 150 minutes of physical activity weekly, eat two servings of fish weekly, eat one serving of tree nuts ( cashews, pistachios, pecans, almonds.Marland Kitchen) every other day, eat 6 servings of fruit/vegetables daily and drink plenty of water and avoid sweet beverages.   2. Dyslipidemia  - atorvastatin (LIPITOR) 40 MG tablet; Take 1 tablet (40 mg total) by mouth daily at 10 pm.  Dispense: 90 tablet; Refill: 1 - Lipid panel  3. Benign essential HTN  - irbesartan-hydrochlorothiazide (AVALIDE) 300-12.5 MG tablet; Take 1 tablet by mouth daily at 10 pm.  Dispense: 90 tablet; Refill: 1 She will stop by for bp check after work one day  - CBC with Differential/Platelet - COMPLETE METABOLIC PANEL WITH GFR  4. History of thyroidectomy, total  - levothyroxine (SYNTHROID, LEVOTHROID) 100 MCG tablet; Take 1 tablet (100 mcg total) by mouth daily before breakfast.  Dispense: 90 tablet; Refill: 1 - TSH  5. 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  Dispense: 180 tablet; Refill: 1 - Dulaglutide (TRULICITY) 1.5 EV/0.3JK SOPN; Inject 1.5 mg into the skin once a week. Sundays  Dispense: 12 pen; Refill: 1 - Hemoglobin A1c  6. Breast cancer screening by mammogram  - MM DIGITAL SCREENING BILATERAL; Future  7. Dry cough  Likely post-bronchial, she had a cold and is still coughing. We will try medication and if no resolution she will call back and we will order a CXR - benzonatate (TESSALON) 100 MG capsule; Take 1-2 capsules (100-200 mg total) by mouth 2 (two) times daily as needed.  Dispense: 40 capsule; Refill: 0 - budesonide-formoterol (SYMBICORT) 160-4.5 MCG/ACT inhaler; Inhale 2 puffs into the lungs 2 (two) times daily.  Dispense: 1 Inhaler; Refill: 0  8. Obesity (BMI 30-39.9)  Discussed with the patient the risk posed by an increased BMI. Discussed importance of portion control, calorie counting and at least 150 minutes of physical activity weekly. Avoid sweet beverages and drink more water. Eat at least 6 servings of fruit and vegetables daily   9. Diabetic nephropathy associated with type 2 diabetes mellitus ([place])   10. Amputated finger, sequela ([place])  As a child

## 2018-03-01 NOTE — Patient Instructions (Signed)
Preventive Care 40-64 Years, Female Preventive care refers to lifestyle choices and visits with your health care provider that can promote health and wellness. What does preventive care include?  A yearly physical exam. This is also called an annual well check.  Dental exams once or twice a year.  Routine eye exams. Ask your health care provider how often you should have your eyes checked.  Personal lifestyle choices, including: ? Daily care of your teeth and gums. ? Regular physical activity. ? Eating a healthy diet. ? Avoiding tobacco and drug use. ? Limiting alcohol use. ? Practicing safe sex. ? Taking low-dose aspirin daily starting at age 57. ? Taking vitamin and mineral supplements as recommended by your health care provider. What happens during an annual well check? The services and screenings done by your health care provider during your annual well check will depend on your age, overall health, lifestyle risk factors, and family history of disease. Counseling Your health care provider may ask you questions about your:  Alcohol use.  Tobacco use.  Drug use.  Emotional well-being.  Home and relationship well-being.  Sexual activity.  Eating habits.  Work and work Statistician.  Method of birth control.  Menstrual cycle.  Pregnancy history.  Screening You may have the following tests or measurements:  Height, weight, and BMI.  Blood pressure.  Lipid and cholesterol levels. These may be checked every 5 years, or more frequently if you are over 57 years old.  Skin check.  Lung cancer screening. You may have this screening every year starting at age 57 if you have a 30-pack-year history of smoking and currently smoke or have quit within the past 15 years.  Fecal occult blood test (FOBT) of the stool. You may have this test every year starting at age 57.  Flexible sigmoidoscopy or colonoscopy. You may have a sigmoidoscopy every 5 years or a colonoscopy  every 10 years starting at age 30.  Hepatitis C blood test.  Hepatitis B blood test.  Sexually transmitted disease (STD) testing.  Diabetes screening. This is done by checking your blood sugar (glucose) after you have not eaten for a while (fasting). You may have this done every 1-3 years.  Mammogram. This may be done every 1-2 years. Talk to your health care provider about when you should start having regular mammograms. This may depend on whether you have a family history of breast cancer.  BRCA-related cancer screening. This may be done if you have a family history of breast, ovarian, tubal, or peritoneal cancers.  Pelvic exam and Pap test. This may be done every 3 years starting at age 57. Starting at age 36, this may be done every 5 years if you have a Pap test in combination with an HPV test.  Bone density scan. This is done to screen for osteoporosis. You may have this scan if you are at high risk for osteoporosis.  Discuss your test results, treatment options, and if necessary, the need for more tests with your health care provider. Vaccines Your health care provider may recommend certain vaccines, such as:  Influenza vaccine. This is recommended every year.  Tetanus, diphtheria, and acellular pertussis (Tdap, Td) vaccine. You may need a Td booster every 10 years.  Varicella vaccine. You may need this if you have not been vaccinated.  Zoster vaccine. You may need this after age 5.  Measles, mumps, and rubella (MMR) vaccine. You may need at least one dose of MMR if you were born in  1957 or later. You may also need a second dose.  Pneumococcal 13-valent conjugate (PCV13) vaccine. You may need this if you have certain conditions and were not previously vaccinated.  Pneumococcal polysaccharide (PPSV23) vaccine. You may need one or two doses if you smoke cigarettes or if you have certain conditions.  Meningococcal vaccine. You may need this if you have certain  conditions.  Hepatitis A vaccine. You may need this if you have certain conditions or if you travel or work in places where you may be exposed to hepatitis A.  Hepatitis B vaccine. You may need this if you have certain conditions or if you travel or work in places where you may be exposed to hepatitis B.  Haemophilus influenzae type b (Hib) vaccine. You may need this if you have certain conditions.  Talk to your health care provider about which screenings and vaccines you need and how often you need them. This information is not intended to replace advice given to you by your health care provider. Make sure you discuss any questions you have with your health care provider. Document Released: 12/25/2015 Document Revised: 08/17/2016 Document Reviewed: 09/29/2015 Elsevier Interactive Patient Education  2018 Elsevier Inc.  

## 2018-03-02 LAB — LIPID PANEL
CHOL/HDL RATIO: 1.6 (calc) (ref <5.0)
CHOLESTEROL: 140 mg/dL (ref <200)
HDL: 85 mg/dL (ref >50)
LDL Cholesterol (Calc): 43 mg/dL (calc)
Non-HDL Cholesterol (Calc): 55 mg/dL (calc) (ref <130)
Triglycerides: 50 mg/dL (ref <150)

## 2018-03-02 LAB — HEMOGLOBIN A1C
Hgb A1c MFr Bld: 6.1 % of total Hgb — ABNORMAL HIGH (ref <5.7)
Mean Plasma Glucose: 128 (calc)
eAG (mmol/L): 7.1 (calc)

## 2018-03-02 LAB — CBC WITH DIFFERENTIAL/PLATELET
BASOS ABS: 27 {cells}/uL (ref 0–200)
Basophils Relative: 0.3 %
EOS ABS: 198 {cells}/uL (ref 15–500)
Eosinophils Relative: 2.2 %
HEMATOCRIT: 42.9 % (ref 35.0–45.0)
Hemoglobin: 14.5 g/dL (ref 11.7–15.5)
LYMPHS ABS: 3978 {cells}/uL — AB (ref 850–3900)
MCH: 28 pg (ref 27.0–33.0)
MCHC: 33.8 g/dL (ref 32.0–36.0)
MCV: 82.8 fL (ref 80.0–100.0)
MPV: 11.3 fL (ref 7.5–12.5)
Monocytes Relative: 7 %
Neutro Abs: 4167 cells/uL (ref 1500–7800)
Neutrophils Relative %: 46.3 %
Platelets: 245 10*3/uL (ref 140–400)
RBC: 5.18 10*6/uL — ABNORMAL HIGH (ref 3.80–5.10)
RDW: 14.9 % (ref 11.0–15.0)
Total Lymphocyte: 44.2 %
WBC mixed population: 630 cells/uL (ref 200–950)
WBC: 9 10*3/uL (ref 3.8–10.8)

## 2018-03-02 LAB — COMPLETE METABOLIC PANEL WITH GFR
AG Ratio: 1.4 (calc) (ref 1.0–2.5)
ALKALINE PHOSPHATASE (APISO): 97 U/L (ref 33–130)
ALT: 14 U/L (ref 6–29)
AST: 16 U/L (ref 10–35)
Albumin: 4.4 g/dL (ref 3.6–5.1)
BILIRUBIN TOTAL: 0.5 mg/dL (ref 0.2–1.2)
BUN: 16 mg/dL (ref 7–25)
CHLORIDE: 101 mmol/L (ref 98–110)
CO2: 30 mmol/L (ref 20–32)
Calcium: 9.3 mg/dL (ref 8.6–10.4)
Creat: 0.96 mg/dL (ref 0.50–1.05)
GFR, Est African American: 77 mL/min/{1.73_m2} (ref >=60)
GFR, Est Non African American: 66 mL/min/{1.73_m2} (ref >=60)
GLOBULIN: 3.1 g/dL (ref 1.9–3.7)
Glucose, Bld: 79 mg/dL (ref 65–139)
Potassium: 3.6 mmol/L (ref 3.5–5.3)
SODIUM: 135 mmol/L (ref 135–146)
Total Protein: 7.5 g/dL (ref 6.1–8.1)

## 2018-03-02 LAB — TSH: TSH: 0.3 mIU/L — ABNORMAL LOW (ref 0.40–4.50)

## 2018-03-13 ENCOUNTER — Ambulatory Visit (INDEPENDENT_AMBULATORY_CARE_PROVIDER_SITE_OTHER): Payer: BLUE CROSS/BLUE SHIELD

## 2018-03-13 ENCOUNTER — Other Ambulatory Visit: Payer: Self-pay | Admitting: Family Medicine

## 2018-03-13 VITALS — BP 114/62 | HR 68 | Ht 67.75 in | Wt 235.6 lb

## 2018-03-13 DIAGNOSIS — I10 Essential (primary) hypertension: Secondary | ICD-10-CM

## 2018-03-13 DIAGNOSIS — G5600 Carpal tunnel syndrome, unspecified upper limb: Principal | ICD-10-CM

## 2018-03-13 DIAGNOSIS — E1141 Type 2 diabetes mellitus with diabetic mononeuropathy: Secondary | ICD-10-CM

## 2018-03-13 DIAGNOSIS — E890 Postprocedural hypothyroidism: Secondary | ICD-10-CM

## 2018-03-13 NOTE — Progress Notes (Signed)
BP Check-Patient denies any symptoms taking irbesartan-hydrochlorothiazide (AVALIDE) 300-12.5 MG tablet; Take 1 tablet by mouth daily at 10 pm.  Dr. Ancil Boozer recommendations if patient is not having any symptoms to stay on same dosage and return on regular check up. Also have blood work for [facility] on Apr 12, 2018. Also to let us know if she experiences any dizziness or headaches and we can cut her dosage in half.

## 2018-03-21 ENCOUNTER — Other Ambulatory Visit: Payer: Self-pay | Admitting: Family Medicine

## 2018-03-21 DIAGNOSIS — I10 Essential (primary) hypertension: Secondary | ICD-10-CM

## 2018-03-23 ENCOUNTER — Encounter: Payer: Self-pay | Admitting: Family Medicine

## 2018-03-23 ENCOUNTER — Ambulatory Visit (INDEPENDENT_AMBULATORY_CARE_PROVIDER_SITE_OTHER): Payer: BLUE CROSS/BLUE SHIELD | Admitting: Family Medicine

## 2018-03-23 DIAGNOSIS — G8929 Other chronic pain: Secondary | ICD-10-CM

## 2018-03-23 DIAGNOSIS — M25561 Pain in right knee: Secondary | ICD-10-CM

## 2018-03-23 NOTE — Patient Instructions (Addendum)
Let's get imaging of the knee Okay to take aleve 220, one or two pills at a time, up to every 12 hours (max of four pills per day) Take with food Tylenol per package directions if needed  Please call [phone] to schedule your imaging test Please wait 2-3 days after the order has been placed to call and get your test scheduled  Check out the information at [email or web address] entitled "Nutrition for Weight Loss: What You Need to Know about Fad Diets" Try to lose between 1-2 pounds per week by taking in fewer calories and burning off more calories You can succeed by limiting portions, limiting foods dense in calories and fat, becoming more active, and drinking 8 glasses of water a day (64 ounces) Don't skip meals, especially breakfast, as skipping meals may alter your metabolism Do not use over-the-counter weight loss pills or gimmicks that claim rapid weight loss A healthy BMI (or body mass index) is between 18.5 and 24.9 You can calculate your ideal BMI at the [place] website ClubMon[employer].fr

## 2018-03-23 NOTE — Progress Notes (Signed)
BP 134/76 (BP Location: Left Arm, Patient Position: Sitting, Cuff Size: Large)   Pulse 73   Temp 97.7 F (36.5 C) (Oral)   Ht 5' 7.75" (1.721 m)   Wt 239 lb 9.6 oz (108.7 kg)   SpO2 99%   BMI 36.70 kg/m    Subjective:    Patient ID: Samantha Copeland, female    DOB: [birth year], 57 y.o.   MRN: [id number]  HPI: Samantha Copeland is a 57 y.o. female  Chief Complaint  Patient presents with  . Leg Pain    RT leg only    HPI Patient is here for f/u; her usual provider is Dr. Ancil Boozer Pain in the right leg; focused in the thigh actually; not focused in the knee Swelled up place of the back of the RIGHT knee and she stepped and a pain shot through; put ice and went away No family or personal hx of blood clot No long plane flights or car trips Pushing on the gas pedal bothers her; aches Tried ice; aleve helps that the most; knows to not take a lot Pain level right now is a 5 out of 10, "it's there" Going on for months now; saw Dr. Vance Peper (started 3 weeks before 01/09/18) He wanted to give a cortisone, patient politely declined Still pops; not locking; having some buckling Hurts with full flexion Reviewed the note from January; worse since January   Depression screen [facility] 2/9 03/23/2018 03/01/2018 12/01/2017 09/01/2017 05/31/2017  Decreased Interest 0 0 0 0 0  Down, Depressed, Hopeless 0 0 0 0 0  PHQ - 2 Score 0 0 0 0 0    Relevant past medical, surgical, family and social history reviewed Past Medical History:  Diagnosis Date  . Arthritis   . Bilateral carpal tunnel syndrome   . Diabetes mellitus without complication ([place])   . Ganglion of right wrist   . Hot flashes   . Hyperlipidemia   . Hypertension   . Hypothyroidism   . Microalbuminuria   . Multinodular goiter   . Neuropathy   . 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[place])   . Obesity   . Snoring    Past Surgical History:  Procedure Laterality Date  . ABDOMINAL HYSTERECTOMY    . 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 BREAST EXCISIONAL BIOPSY Right    benign  . DORSAL COMPARTMENT RELEASE Left 01/18/2017   Procedure: RELEASE DORSAL COMPARTMENT (DEQUERVAIN);  Surgeon: Dereck Leep, MD;  Location: [facility] ORS;  Service: Orthopedics;  Laterality: Left;  . FINE NEEDLE ASPIRATION  10/01/2013   Thyroid-Dr. Gabriel Carina  . THYROIDECTOMY N/A 12/09/2015   Procedure: THYROIDECTOMY;  Surgeon: Clyde Canterbury, MD;  Location: [facility] ORS;  Service: ENT;  Laterality: N/A;   Family History  Problem Relation Age of Onset  . Diabetes Mother   . Diabetes Father   . Hypertension Father   . Hyperlipidemia Father   . Cancer Father   . Diabetes Sister   . Breast cancer Neg Hx    Social History   Tobacco Use  . Smoking status: Never Smoker  . Smokeless tobacco: Never Used  Substance Use Topics  . Alcohol use: Yes    Alcohol/week: 0.0 oz    Comment: occasionally  . Drug use: No    Interim medical history since last visit reviewed. Allergies and medications reviewed  Review of Systems Per HPI unless specifically indicated above  Objective:    BP 134/76 (BP Location: Left Arm, Patient Position: Sitting, Cuff Size: Large)   Pulse 73   Temp 97.7 F (36.5 C) (Oral)   Ht 5' 7.75" (1.721 m)   Wt 239 lb 9.6 oz (108.7 kg)   SpO2 99%   BMI 36.70 kg/m   Wt Readings from Last 3 Encounters:  03/23/18 239 lb 9.6 oz (108.7 kg)  03/13/18 235 lb 9.6 oz (106.9 kg)  03/01/18 235 lb 9.6 oz (106.9 kg)    Physical Exam  Constitutional: She appears well-developed and well-nourished.  Morbidly obese  HENT:  Mouth/Throat: Mucous membranes are normal.  Eyes: EOM are normal. No scleral icterus.  Cardiovascular: Normal rate and regular rhythm.  Pulmonary/Chest: Effort normal and breath sounds normal.  Musculoskeletal:       Right knee: She exhibits decreased range of motion, swelling and effusion. She exhibits no deformity, no erythema, no LCL laxity and no MCL laxity. No  medial joint line, no lateral joint line, no MCL, no LCL and no patellar tendon tenderness noted.       Legs: Psychiatric: She has a normal mood and affect. Her behavior is normal.    Results for orders placed or performed in visit on 03/01/18  CBC with Differential/Platelet  Result Value Ref Range   WBC 9.0 3.8 - 10.8 Thousand/uL   RBC 5.18 (H) 3.80 - 5.10 Million/uL   Hemoglobin 14.5 11.7 - 15.5 g/dL   HCT 42.9 35.0 - 45.0 %   MCV 82.8 80.0 - 100.0 fL   MCH 28.0 27.0 - 33.0 pg   MCHC 33.8 32.0 - 36.0 g/dL   RDW 14.9 11.0 - 15.0 %   Platelets 245 140 - 400 Thousand/uL   MPV 11.3 7.5 - 12.5 fL   Neutro Abs 4,167 1,500 - 7,800 cells/uL   Lymphs Abs 3,978 (H) 850 - 3,900 cells/uL   WBC mixed population 630 200 - 950 cells/uL   Eosinophils Absolute 198 15 - 500 cells/uL   Basophils Absolute 27 0 - 200 cells/uL   Neutrophils Relative % 46.3 %   Total Lymphocyte 44.2 %   Monocytes Relative 7.0 %   Eosinophils Relative 2.2 %   Basophils Relative 0.3 %  COMPLETE METABOLIC PANEL WITH GFR  Result Value Ref Range   Glucose, Bld 79 65 - 139 mg/dL   BUN 16 7 - 25 mg/dL   Creat 0.96 0.50 - 1.05 mg/dL   GFR, Est Non African American 66 > OR = 60 mL/min/1.73m2   GFR, Est African American 77 > OR = 60 mL/min/1.74m2   BUN/Creatinine Ratio NOT APPLICABLE 6 - 22 (calc)   Sodium 135 135 - 146 mmol/L   Potassium 3.6 3.5 - 5.3 mmol/L   Chloride 101 98 - 110 mmol/L   CO2 30 20 - 32 mmol/L   Calcium 9.3 8.6 - 10.4 mg/dL   Total Protein 7.5 6.1 - 8.1 g/dL   Albumin 4.4 3.6 - 5.1 g/dL   Globulin 3.1 1.9 - 3.7 g/dL (calc)   AG Ratio 1.4 1.0 - 2.5 (calc)   Total Bilirubin 0.5 0.2 - 1.2 mg/dL   Alkaline phosphatase (APISO) 97 33 - 130 U/L   AST 16 10 - 35 U/L   ALT 14 6 - 29 U/L  Hemoglobin A1c  Result Value Ref Range   Hgb A1c MFr Bld 6.1 (H) <5.7 % of total Hgb   Mean Plasma Glucose 128 (calc)   eAG (mmol/L) 7.1 (calc)  Lipid panel  Result Value Ref Range   Cholesterol 140 <200 mg/dL    HDL 85 >50 mg/dL   Triglycerides 50 <150 mg/dL   LDL Cholesterol (Calc) 43 mg/dL (calc)   Total CHOL/HDL Ratio 1.6 <5.0 (calc)   Non-HDL Cholesterol (Calc) 55 <130 mg/dL (calc)  TSH  Result Value Ref Range   TSH 0.30 (L) 0.40 - 4.50 mIU/L      Assessment & Plan:   Problem List Items Addressed This Visit      Other   Morbid obesity ([place])    BMI > 35 plus diabetes and hyperlipidemia; talked with patient about her weight; offered help; realize it's frustrating; she declined assistance at this time       Other Visit Diagnoses    Chronic pain of right knee       with what feels like a Baker's cyst; persistent pain not explained by plain films at ortho; will proceed with MRI; she declines PT   Relevant Orders   MR Knee Left  Wo Contrast       Follow up plan: Return in about 1 month (around 04/20/2018) for with Dr. Ancil Boozer.  An after-visit summary was printed and given to the patient at [place].  Please see the patient instructions which may contain other information and recommendations beyond what is mentioned above in the assessment and plan.  No orders of the defined types were placed in this encounter.   Orders Placed This Encounter  Procedures  . MR Knee Left  Wo Contrast

## 2018-03-23 NOTE — Assessment & Plan Note (Signed)
BMI > 35 plus diabetes and hyperlipidemia; talked with patient about her weight; offered help; realize it's frustrating; she declined assistance at this time

## 2018-03-26 NOTE — Addendum Note (Signed)
Addended by: Saunders Glance A on: 03/26/2018 04:21 PM   Modules accepted: Orders

## 2018-04-06 ENCOUNTER — Encounter: Payer: Self-pay | Admitting: Family Medicine

## 2018-04-06 ENCOUNTER — Ambulatory Visit
Admission: RE | Admit: 2018-04-06 | Discharge: 2018-04-06 | Disposition: A | Discharge status: Home / Self Care | Payer: BLUE CROSS/BLUE SHIELD | Source: Ambulatory Visit | Attending: Family Medicine | Admitting: Family Medicine

## 2018-04-06 ENCOUNTER — Telehealth: Payer: Self-pay

## 2018-04-06 DIAGNOSIS — G8929 Other chronic pain: Secondary | ICD-10-CM | POA: Diagnosis present

## 2018-04-06 DIAGNOSIS — M1711 Unilateral primary osteoarthritis, right knee: Secondary | ICD-10-CM | POA: Diagnosis not present

## 2018-04-06 DIAGNOSIS — S83249A Other tear of medial meniscus, current injury, unspecified knee, initial encounter: Secondary | ICD-10-CM

## 2018-04-06 DIAGNOSIS — M25561 Pain in right knee: Secondary | ICD-10-CM | POA: Diagnosis present

## 2018-04-06 DIAGNOSIS — M179 Osteoarthritis of knee, unspecified: Secondary | ICD-10-CM | POA: Insufficient documentation

## 2018-04-06 DIAGNOSIS — S83231A Complex tear of medial meniscus, current injury, right knee, initial encounter: Secondary | ICD-10-CM | POA: Diagnosis not present

## 2018-04-06 DIAGNOSIS — M25461 Effusion, right knee: Secondary | ICD-10-CM | POA: Insufficient documentation

## 2018-04-06 DIAGNOSIS — M1710 Unilateral primary osteoarthritis, unspecified knee: Secondary | ICD-10-CM

## 2018-04-06 DIAGNOSIS — S83241A Other tear of medial meniscus, current injury, right knee, initial encounter: Secondary | ICD-10-CM

## 2018-04-06 HISTORY — DX: Other tear of medial meniscus, current injury, unspecified knee, initial encounter: S83.249A

## 2018-04-06 HISTORY — DX: Osteoarthritis of knee, unspecified: M17.9

## 2018-04-06 HISTORY — DX: Unilateral primary osteoarthritis, unspecified knee: M17.10

## 2018-04-06 NOTE — Telephone Encounter (Signed)
-----   Message from Arnetha Courser, MD sent at 04/06/2018 12:28 PM EDT ----- Roselyn Reef, please let the patient know that she indeed has a large tear of her medial meniscus; she also has significant loss of cartilage and marrow edema, osteoarthritis, several things going on with her knee; please REFER to ortho, provider / practice of choice

## 2018-04-08 ENCOUNTER — Other Ambulatory Visit: Payer: Self-pay | Admitting: Family Medicine

## 2018-04-08 DIAGNOSIS — E785 Hyperlipidemia, unspecified: Secondary | ICD-10-CM

## 2018-04-17 LAB — TSH: TSH: 1.71 mIU/L (ref 0.40–4.50)

## 2018-04-18 ENCOUNTER — Other Ambulatory Visit: Payer: Self-pay | Admitting: Family Medicine

## 2018-04-18 DIAGNOSIS — E890 Postprocedural hypothyroidism: Secondary | ICD-10-CM

## 2018-04-18 MED ORDER — LEVOTHYROXINE SODIUM 100 MCG PO TABS
100.0000 ug | ORAL_TABLET | Freq: Every day | ORAL | 0 refills | Status: DC
Start: 2018-04-18 — End: 2018-07-02

## 2018-04-23 ENCOUNTER — Ambulatory Visit: Payer: BLUE CROSS/BLUE SHIELD | Admitting: Family Medicine

## 2018-05-11 ENCOUNTER — Other Ambulatory Visit: Payer: Self-pay

## 2018-05-11 MED ORDER — OLMESARTAN MEDOXOMIL-HCTZ 40-12.5 MG PO TABS: 1.0000 | ORAL_TABLET | Freq: Every day | ORAL | 0 refills | Status: DC

## 2018-05-11 NOTE — Telephone Encounter (Signed)
Patient insurance does not cover Irbesar/HCTZ. Dr. Ancil Boozer verbalized to change medication to Benicar HCTZ.

## 2018-05-14 ENCOUNTER — Other Ambulatory Visit: Payer: Self-pay | Admitting: Family Medicine

## 2018-05-14 DIAGNOSIS — N6310 Unspecified lump in the right breast, unspecified quadrant: Secondary | ICD-10-CM

## 2018-06-28 ENCOUNTER — Ambulatory Visit
Admission: RE | Admit: 2018-06-28 | Discharge: 2018-06-28 | Disposition: A | Discharge status: Home / Self Care | Payer: BLUE CROSS/BLUE SHIELD | Source: Ambulatory Visit | Attending: Family Medicine | Admitting: Family Medicine

## 2018-06-28 DIAGNOSIS — N6310 Unspecified lump in the right breast, unspecified quadrant: Secondary | ICD-10-CM

## 2018-07-02 ENCOUNTER — Ambulatory Visit (INDEPENDENT_AMBULATORY_CARE_PROVIDER_SITE_OTHER): Payer: BLUE CROSS/BLUE SHIELD | Admitting: Family Medicine

## 2018-07-02 ENCOUNTER — Encounter: Payer: Self-pay | Admitting: Family Medicine

## 2018-07-02 VITALS — BP 122/82 | HR 73 | Temp 98.3°F | Resp 16 | Ht 68.0 in | Wt 233.3 lb

## 2018-07-02 DIAGNOSIS — E1141 Type 2 diabetes mellitus with diabetic mononeuropathy: Secondary | ICD-10-CM | POA: Diagnosis not present

## 2018-07-02 DIAGNOSIS — I10 Essential (primary) hypertension: Secondary | ICD-10-CM | POA: Diagnosis not present

## 2018-07-02 DIAGNOSIS — M23351 Other meniscus derangements, posterior horn of lateral meniscus, right knee: Secondary | ICD-10-CM

## 2018-07-02 DIAGNOSIS — E1121 Type 2 diabetes mellitus with diabetic nephropathy: Secondary | ICD-10-CM

## 2018-07-02 DIAGNOSIS — E11628 Type 2 diabetes mellitus with other skin complications: Secondary | ICD-10-CM

## 2018-07-02 DIAGNOSIS — G5600 Carpal tunnel syndrome, unspecified upper limb: Secondary | ICD-10-CM

## 2018-07-02 DIAGNOSIS — E890 Postprocedural hypothyroidism: Secondary | ICD-10-CM | POA: Diagnosis not present

## 2018-07-02 DIAGNOSIS — M25561 Pain in right knee: Secondary | ICD-10-CM

## 2018-07-02 DIAGNOSIS — M1711 Unilateral primary osteoarthritis, right knee: Secondary | ICD-10-CM

## 2018-07-02 DIAGNOSIS — E785 Hyperlipidemia, unspecified: Secondary | ICD-10-CM

## 2018-07-02 DIAGNOSIS — S68119S Complete traumatic metacarpophalangeal amputation of unspecified finger, sequela: Secondary | ICD-10-CM

## 2018-07-02 DIAGNOSIS — G8929 Other chronic pain: Secondary | ICD-10-CM

## 2018-07-02 DIAGNOSIS — Z9889 Other specified postprocedural states: Secondary | ICD-10-CM

## 2018-07-02 DIAGNOSIS — L84 Corns and callosities: Secondary | ICD-10-CM

## 2018-07-02 LAB — POCT UA - MICROALBUMIN: Microalbumin Ur, POC: 20 mg/L

## 2018-07-02 LAB — POCT GLYCOSYLATED HEMOGLOBIN (HGB A1C): HEMOGLOBIN A1C: 6.3 % — AB (ref 4.0–5.6)

## 2018-07-02 MED ORDER — METFORMIN HCL 500 MG PO TABS: 500.0000 mg | ORAL_TABLET | Freq: Two times a day (BID) | ORAL | 1 refills | Status: DC

## 2018-07-02 MED ORDER — LEVOTHYROXINE SODIUM 100 MCG PO TABS: 100.0000 ug | ORAL_TABLET | Freq: Every day | ORAL | 0 refills | Status: DC

## 2018-07-02 MED ORDER — OLMESARTAN MEDOXOMIL-HCTZ 40-12.5 MG PO TABS: 1.0000 | ORAL_TABLET | Freq: Every day | ORAL | 1 refills | Status: DC

## 2018-07-02 MED ORDER — DULAGLUTIDE 1.5 MG/0.5ML ~~LOC~~ SOAJ: 1.5000 mg | SUBCUTANEOUS | 1 refills | Status: DC

## 2018-07-02 NOTE — Progress Notes (Signed)
Name: Samantha Copeland   MRN: [id number]    DOB: 06-24-1961   Date:07/02/2018       Progress Note  Subjective  Chief Complaint  Chief Complaint  Patient presents with  . Medication Refill    Would like to get a prescription for DM shoes-  . Diabetes    Checks every morning and before bed, Eating healthier and lost 7 pounds since last visit. Averages-80's  . Hypertension    Denies any symptoms  . Hyperlipidemia  . 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 She denies hypoglycemic episodes, she denies polyphagia, polydipsia or polyuria.. Symptoms of carpal tunnel and feet tingling has improved significantly - no symptoms at this time . She has been off Gabapentin. Continue life style modification. She is following a diabetic diet, eating fish twice weekly,last urine micro was normal. Eye exam is up to date.   HTN: bp is at goal, she denies side effects of medications. Compliant with medication . No chest pain or SOB or palpitation, or orthostatic changes. ARB is working well for her. Also on HCTZ   Hyperlipidemia: she is on Atorvastatin daily. Discussed ways to increase HDL, she has been eating more fish and also tree nuts. Last LDL was at goal at 43 and HDL was 85   Obesity:she lost almost 7  lbs since last visit, following a healthy diet, eating wheat pasta now, more fruit and vegetables. Doing well   Goiter: seeing Dr. Richardson Landry had total thyroidectomy in December 2016, doing well, on Synthroid 100 mcg daily and has been  released by Dr. Gabriel Carina. No tachycardia, constipation has resolved with dietary modification . Last TSH was normal. Currently on Synthroid 100 mcg daily and half on Sundays  OA and right meniscal tear: seen by Ortho, advised to increase activity and go back for steroid injection or surgery if no improvement. Read note to patient today. Still has pain and effusion when active. Advised to go back   Patient Active Problem List   Diagnosis Date Noted  . Acute medial meniscus tear 04/06/2018  . Osteoarthritis, knee 04/06/2018  . Diabetic nephropathy associated with type 2 diabetes mellitus ([place]) 03/01/2018  . Trigger thumb of right hand 05/31/2017  . Status post de Quervain's release surgery 03/05/2017  . Hypothyroidism, postop 03/14/2016  . Benign essential HTN 05/30/2015  . Carpal tunnel syndrome 05/30/2015  . Dyslipidemia 05/30/2015  . 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 History of thyroidectomy, total 05/30/2015  . Microalbuminuria 05/30/2015  . Morbid obesity ([place]) 05/30/2015    Past Surgical History:  Procedure Laterality Date  . ABDOMINAL HYSTERECTOMY    . 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 BREAST EXCISIONAL BIOPSY Right    benign  . DORSAL COMPARTMENT RELEASE Left 01/18/2017   Procedure: RELEASE DORSAL COMPARTMENT (DEQUERVAIN);  Surgeon: Dereck Leep, MD;  Location: [facility] ORS;  Service: Orthopedics;  Laterality: Left;  .  FINE NEEDLE ASPIRATION  10/01/2013   Thyroid-Dr. Gabriel Carina  . THYROIDECTOMY N/A 12/09/2015   Procedure: THYROIDECTOMY;  Surgeon: Clyde Canterbury, MD;  Location: [facility] ORS;  Service: ENT;  Laterality: N/A;    Family History  Problem Relation Age of Onset  . Diabetes Mother   . Diabetes Father   . Hypertension Father   . Hyperlipidemia Father   . Cancer Father   . Diabetes Sister   . Breast cancer Neg Hx     Social History   Socioeconomic History  . Marital status: Married    Spouse  name: Orpah Greek  . Number of children: 3  . Years of education: Not on file  . Highest education level: 12th grade  Occupational History  . 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 Financial resource strain: Not hard at all  . Food insecurity:    Worry: Never true    Inability: Never true  . Transportation needs:    Medical: No    Non-medical: No  Tobacco Use  . Smoking status: Never Smoker  . Smokeless tobacco: Never Used  Substance and Sexual Activity  . Alcohol use: Yes    Alcohol/week: 0.0 oz    Comment: occasionally  . Drug use: No  . Sexual activity: Yes    Partners: Male    Birth control/protection: Surgical  Lifestyle  . Physical activity:    Days per week: 0 days    Minutes per session: 0 min  . Stress: Not at all  Relationships  . Social connections:    Talks on phone: More than three times a week    Gets together: Three times a week    Attends religious service: 1 to 4 times per year    Active member of club or organization: No    Attends meetings of clubs or organizations: Never    Relationship status: Married  . Intimate partner violence:    Fear of current or ex partner: No    Emotionally abused: No    Physically abused: No    Forced sexual activity: No  Other Topics Concern  . Not on file  Social History Narrative   Lives with husband   Three grown children    One grandson      Current Outpatient Medications:  .  acetaminophen (TYLENOL) 325 MG tablet, Take 650 mg by mouth every 6 (six) hours as needed., Disp: , Rfl:  .  aspirin EC 81 MG tablet, Take 81 mg by mouth every evening. , Disp: , Rfl:  .  atorvastatin (LIPITOR) 40 MG tablet, TAKE 1 TABLET (40 MG TOTAL) BY MOUTH DAILY AT 10 PM., Disp: 90 tablet, Rfl: 1 .  Calcium-Phosphorus-Vitamin D (CALCIUM GUMMIES PO), Take 2 tablets by mouth daily., Disp: , Rfl:  .  Dulaglutide (TRULICITY) 1.5 LN/9.8XQ SOPN, Inject 1.5 mg into the skin once a week. Sundays, Disp:  12 pen, Rfl: 1 .  levothyroxine (SYNTHROID, LEVOTHROID) 100 MCG tablet, Take 1 tablet (100 mcg total) by mouth daily before breakfast. And half on Sundays, Disp: 90 tablet, Rfl: 0 .  metFORMIN (GLUCOPHAGE) 500 MG tablet, Take 1 tablet (500 mg total) by mouth 2 (two) times daily with a meal., Disp: 180 tablet, Rfl: 1 .  Multiple Vitamin (MULTIVITAMIN WITH MINERALS) TABS tablet, Take 1 tablet by mouth daily. ALIVE MULTIVITAMIN, Disp: , Rfl:  .  olmesartan-hydrochlorothiazide (BENICAR HCT) 40-12.5 MG tablet, Take 1 tablet by mouth daily., Disp:  90 tablet, Rfl: 1  No Known Allergies   ROS  Constitutional: Negative for fever, positive for  weight change.  Respiratory: Negative for cough and shortness of breath.   Cardiovascular: Negative for chest pain or palpitations.  Gastrointestinal: Negative for abdominal pain, no bowel changes.  Musculoskeletal: Positive  for occasional gait problem , positive for intermittent joint swelling.  Skin: Negative for rash.  Neurological: Negative for dizziness or headache.  No other specific complaints in a complete review of systems (except as listed in HPI above).  Objective  Vitals:   07/02/18 0924  BP: 122/82  Pulse: 73  Resp: 16  Temp: 98.3 F (36.8 C)  TempSrc: Oral  SpO2: 94%  Weight: 233 lb 4.8 oz (105.8 kg)  Height: 5\' 8"  (1.727 m)    Body mass index is 35.47 kg/m.  Physical Exam  Constitutional: Patient appears well-developed and well-nourished. Obese  No distress.  HEENT: head atraumatic, normocephalic, pupils equal and reactive to light,  neck supple, throat within normal limits Cardiovascular: Normal rate, regular rhythm and normal heart sounds.  No murmur heard. No BLE edema. Pulmonary/Chest: Effort normal and breath sounds normal. No respiratory distress. Abdominal: Soft.  There is no tenderness. Muscular Skeletal: no effusion today  Psychiatric: Patient has a normal mood and affect. behavior is normal. Judgment and thought  content normal.  Recent Results (from the past 2160 hour(s))  TSH     Status: None   Collection Time: 04/17/18  8:15 AM  Result Value Ref Range   TSH 1.71 0.40 - 4.50 mIU/L  POCT HgB A1C     Status: Abnormal   Collection Time: 07/02/18  9:29 AM  Result Value Ref Range   Hemoglobin A1C 6.3 (A) 4.0 - 5.6 %   HbA1c POC (<> result, manual entry)  4.0 - 5.6 %   HbA1c, POC (prediabetic range)  5.7 - 6.4 %   HbA1c, POC (controlled diabetic range)  0.0 - 7.0 %  POCT UA - Microalbumin     Status: Normal   Collection Time: 07/02/18  9:29 AM  Result Value Ref Range   Microalbumin Ur, POC 20 mg/L   Creatinine, POC  mg/dL   Albumin/Creatinine Ratio, Urine, POC       PHQ2/9: Depression screen [facility] 2/9 07/02/2018 03/23/2018 03/01/2018 12/01/2017 09/01/2017  Decreased Interest 0 0 0 0 0  Down, Depressed, Hopeless 0 0 0 0 0  PHQ - 2 Score 0 0 0 0 0     Fall Risk: Fall Risk  07/02/2018 03/23/2018 03/01/2018 12/01/2017 09/01/2017  Falls in the past year? No No No No No    Functional Status Survey: Is the patient deaf or have difficulty hearing?: No Does the patient have difficulty seeing, even when wearing glasses/contacts?: Yes Does the patient have difficulty concentrating, remembering, or making decisions?: No Does the patient have difficulty walking or climbing stairs?: No Does the patient have difficulty dressing or bathing?: No Does the patient have difficulty doing errands alone such as visiting a doctor's office or shopping?: No    Assessment & Plan  1. 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  Dispense: 180 tablet; Refill: 1 - Dulaglutide (TRULICITY) 1.5 ZO/1.0RU SOPN; Inject 1.5 mg into the skin once a week. Sundays  Dispense: 12 pen; Refill: 1  2. History  of thyroidectomy, total  - levothyroxine (SYNTHROID,  LEVOTHROID) 100 MCG tablet; Take 1 tablet (100 mcg total) by mouth daily before breakfast. And half on Sundays  Dispense: 90 tablet; Refill: 0  3. Morbid obesity ([place])  Discussed with the patient the risk posed by an increased BMI. Discussed importance of portion control, calorie counting and at least 150 minutes of physical activity weekly. Avoid sweet beverages and drink more water. Eat at least 6 servings of fruit and vegetables daily   4. Benign essential HTN  - olmesartan-hydrochlorothiazide (BENICAR HCT) 40-12.5 MG tablet; Take 1 tablet by mouth daily.  Dispense: 90 tablet; Refill: 1  5. Dyslipidemia  Continue statin therapy  6. Hypothyroidism, postop  Offered TSH   7. Amputated finger, sequela (HCC)  stable  8. Chronic pain of right knee  Advised Tylenol only   9. Degenerative tear of posterior horn of lateral meniscus, right   10. Primary osteoarthritis of right knee   11. Diabetic nephropathy associated with type 2 diabetes mellitus (HCC)  - olmesartan-hydrochlorothiazide (BENICAR HCT) 40-12.5 MG tablet; Take 1 tablet by mouth daily.  Dispense: 90 tablet; Refill: 1 - metFORMIN (GLUCOPHAGE) 500 MG tablet; Take 1 tablet (500 mg total) by mouth 2 (two) times daily with a meal.  Dispense: 180 tablet; Refill: 1 - Dulaglutide (TRULICITY) 1.5 HA/5.7XU SOPN; Inject 1.5 mg into the skin once a week. Sundays  Dispense: 12 pen; Refill: 1  12. Type 2 diabetes mellitus with pressure callus (HCC)  Large callus on left medial rear foot

## 2018-09-03 ENCOUNTER — Other Ambulatory Visit: Payer: Self-pay | Admitting: Family Medicine

## 2018-09-03 DIAGNOSIS — E890 Postprocedural hypothyroidism: Secondary | ICD-10-CM

## 2018-09-03 NOTE — Telephone Encounter (Signed)
Refill request for thyroid medication. Levothyroxine to [facility].   Last Physical:  Lab Results  Component Value Date   TSH 1.71 04/17/2018     Follow up on 10/08/2018

## 2018-10-08 ENCOUNTER — Encounter: Payer: Self-pay | Admitting: Family Medicine

## 2018-10-08 ENCOUNTER — Ambulatory Visit (INDEPENDENT_AMBULATORY_CARE_PROVIDER_SITE_OTHER): Payer: BLUE CROSS/BLUE SHIELD | Admitting: Family Medicine

## 2018-10-08 VITALS — BP 130/78 | HR 65 | Temp 98.0°F | Resp 16 | Ht 68.0 in | Wt 231.2 lb

## 2018-10-08 DIAGNOSIS — Z23 Encounter for immunization: Secondary | ICD-10-CM | POA: Diagnosis not present

## 2018-10-08 DIAGNOSIS — E890 Postprocedural hypothyroidism: Secondary | ICD-10-CM

## 2018-10-08 DIAGNOSIS — E785 Hyperlipidemia, unspecified: Secondary | ICD-10-CM

## 2018-10-08 DIAGNOSIS — E11628 Type 2 diabetes mellitus with other skin complications: Secondary | ICD-10-CM

## 2018-10-08 DIAGNOSIS — I10 Essential (primary) hypertension: Secondary | ICD-10-CM

## 2018-10-08 DIAGNOSIS — S68119S Complete traumatic metacarpophalangeal amputation of unspecified finger, sequela: Secondary | ICD-10-CM

## 2018-10-08 DIAGNOSIS — L84 Corns and callosities: Secondary | ICD-10-CM

## 2018-10-08 DIAGNOSIS — G5600 Carpal tunnel syndrome, unspecified upper limb: Secondary | ICD-10-CM

## 2018-10-08 DIAGNOSIS — E1141 Type 2 diabetes mellitus with diabetic mononeuropathy: Secondary | ICD-10-CM

## 2018-10-08 NOTE — Progress Notes (Signed)
Name: Samantha Copeland   MRN: [id number]    DOB: 1961/01/10   Date:10/08/2018       Progress Note  Subjective  Chief Complaint  Chief Complaint  Patient presents with  . Follow-up    3 months  . Hyperlipidemia  . Hypothyroidism  . Diabetes  . Hypertension  . paperwork    HPI  DMII with nephropathy, callus formation left foot and carpal tunnel syndrome: she is currently on Metofrmin and  Ozempic since  March 2018 and she has lost 10  lbs and hgbA1C was down to 6.3% on her last visit  however insurance denied.She denies hypoglycemic episodes, she has episodes of polyphagia but  polydipsia or polyuria.. Symptoms of carpal tunnel resolved with surgery in 2018, last urine micro was normal ( she is on ARB), she has callus formation on left foot and flat feet bilaterally and we will order diabetic shoes.. She is following a diabetic diet, eating fish twice weekly Eye exam is up to date.   HTN: bp is at goal, she denies side effects of medications. Compliant with medication . No chest pain or SOB or palpitation, or orthostatic changes. On Benicar HCTZ and doing well.   Hyperlipidemia: she is on Atorvastatin daily. Discussed ways to increase HDL, she has been eating more fish and also tree nuts. Last LDL was at goal at 43 and HDL was 85 . Continue life style modification  Obesity:she lost almost 10  lbs since started on Ozempic, trying to eat healthy, she increased vegetables and fruit intake.   Hypothyroidism: seeing Dr. Richardson Landry had total thyroidectomy in December 2016, doing well, on Synthroid 100 mcg daily and half on Sundays. She  has been released by Dr. Gabriel Carina. No tachycardia, constipation has resolved with dietary modification. Last TSH was normal.   OA and right meniscal tear: seen by Ortho, advised to increase activity and go back for steroid injection or surgery if no improvement. She is doing home exercise    Patient Active Problem List   Diagnosis Date Noted  . 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 Acute medial meniscus tear 04/06/2018  . Osteoarthritis, knee 04/06/2018  . Diabetic nephropathy associated with type 2 diabetes mellitus ([place]) 03/01/2018  . Trigger thumb of right hand 05/31/2017  . Status post de Quervain's release surgery 03/05/2017  . Hypothyroidism, postop 03/14/2016  . Benign essential HTN 05/30/2015  . Carpal tunnel syndrome 05/30/2015  . Dyslipidemia 05/30/2015  . Diabetes mellitus type 2 with carpal tunnel syndrome ([place]) 05/30/2015  . History of thyroidectomy, total 05/30/2015  . Microalbuminuria 05/30/2015  . Morbid obesity ([place]) 05/30/2015    Past Surgical History:  Procedure Laterality Date  . ABDOMINAL HYSTERECTOMY    . 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 BREAST EXCISIONAL BIOPSY Right    benign  . DORSAL COMPARTMENT RELEASE Left 01/18/2017   Procedure: RELEASE DORSAL COMPARTMENT (DEQUERVAIN);  Surgeon: Dereck Leep, MD;  Location: [facility] ORS;  Service: Orthopedics;  Laterality: Left;  . FINE NEEDLE ASPIRATION  10/01/2013   Thyroid-Dr. Gabriel Carina  . THYROIDECTOMY N/A 12/09/2015   Procedure: THYROIDECTOMY;  Surgeon: Clyde Canterbury, MD;  Location: [facility] ORS;  Service: ENT;  Laterality: N/A;    Family History  Problem Relation Age of Onset  . Diabetes Mother   . Diabetes Father   . Hypertension Father   . Hyperlipidemia Father   . Cancer Father   . Diabetes Sister   .  Breast cancer Neg Hx     Social History   Socioeconomic History  . Marital status: Married    Spouse name: Orpah Greek  . Number of children: 3  . Years of education: Not on file  . Highest education level: 12th grade  Occupational History  . 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 Financial resource strain: Not hard at all  . Food insecurity:    Worry: Never true    Inability: Never true  . Transportation needs:    Medical: No    Non-medical: No   Tobacco Use  . Smoking status: Never Smoker  . Smokeless tobacco: Never Used  Substance and Sexual Activity  . Alcohol use: Yes    Alcohol/week: 0.0 standard drinks    Comment: occasionally  . Drug use: No  . Sexual activity: Yes    Partners: Male    Birth control/protection: Surgical  Lifestyle  . Physical activity:    Days per week: 0 days    Minutes per session: 0 min  . Stress: Not at all  Relationships  . Social connections:    Talks on phone: More than three times a week    Gets together: Three times a week    Attends religious service: 1 to 4 times per year    Active member of club or organization: No    Attends meetings of clubs or organizations: Never    Relationship status: Married  . Intimate partner violence:    Fear of current or ex partner: No    Emotionally abused: No    Physically abused: No    Forced sexual activity: No  Other Topics Concern  . Not on file  Social History Narrative   Lives with husband   Three grown children    One grandson      Current Outpatient Medications:  .  acetaminophen (TYLENOL) 325 MG tablet, Take 650 mg by mouth every 6 (six) hours as needed., Disp: , Rfl:  .  aspirin EC 81 MG tablet, Take 81 mg by mouth every evening. , Disp: , Rfl:  .  atorvastatin (LIPITOR) 40 MG tablet, TAKE 1 TABLET (40 MG TOTAL) BY MOUTH DAILY AT 10 PM., Disp: 90 tablet, Rfl: 1 .  Calcium-Phosphorus-Vitamin D (CALCIUM GUMMIES PO), Take 2 tablets by mouth daily., Disp: , Rfl:  .  Dulaglutide (TRULICITY) 1.5 WI/0.9BD SOPN, Inject 1.5 mg into the skin once a week. Sundays, Disp: 12 pen, Rfl: 1 .  levothyroxine (SYNTHROID, LEVOTHROID) 100 MCG tablet, Take 1 tablet (100 mcg total) by mouth daily. And only half on Sundays, Disp: 90 tablet, Rfl: 1 .  metFORMIN (GLUCOPHAGE) 500 MG tablet, Take 1 tablet (500 mg total) by mouth 2 (two) times daily with a meal., Disp: 180 tablet, Rfl: 1 .  Multiple Vitamin (MULTIVITAMIN WITH MINERALS) TABS tablet, Take 1 tablet  by mouth daily. ALIVE MULTIVITAMIN, Disp: , Rfl:  .  olmesartan-hydrochlorothiazide (BENICAR HCT) 40-12.5 MG tablet, Take 1 tablet by mouth daily., Disp: 90 tablet, Rfl: 1 .  vitamin B-12 (CYANOCOBALAMIN) 100 MCG tablet, Take 100 mcg by mouth daily., Disp: , Rfl:   No Known Allergies  I personally reviewed active problem list, medication list, allergies, family history, social history with the patient/caregiver today.   ROS  Constitutional: Negative for fever , positive for mild weight change.  Respiratory: Negative for cough and shortness of breath.   Cardiovascular: Negative for chest pain or palpitations.  Gastrointestinal: Negative for abdominal pain, no bowel changes.  Musculoskeletal: Negative for gait problem or joint swelling.  Skin: Negative for rash.  Neurological: Negative for dizziness or headache.  No other specific complaints in a complete review of systems (except as listed in HPI above).   Objective  Vitals:   10/08/18 0910  BP: 130/78  Pulse: 65  Resp: 16  Temp: 98 F (36.7 C)  TempSrc: Oral  SpO2: 99%  Weight: 231 lb 3.2 oz (104.9 kg)  Height: 5\' 8"  (1.727 m)    Body mass index is 35.15 kg/m.  Physical Exam  Constitutional: Patient appears well-developed and well-nourished. Obese  No distress.  HEENT: head atraumatic, normocephalic, pupils equal and reactive to light, neck supple, throat within normal limits Cardiovascular: Normal rate, regular rhythm and normal heart sounds.  No murmur heard. No BLE edema. Pulmonary/Chest: Effort normal and breath sounds normal. No respiratory distress. Abdominal: Soft.  There is no tenderness. Psychiatric: Patient has a normal mood and affect. behavior is normal. Judgment and thought content normal.  Diabetic Foot Exam: Diabetic Foot Exam - Simple   Simple Foot Form Diabetic Foot exam was performed with the following findings:  Yes 10/08/2018 10:03 AM  Visual Inspection See comments:  Yes Sensation  Testing Intact to touch and monofilament testing bilaterally:  Yes Pulse Check Posterior Tibialis and Dorsalis pulse intact bilaterally:  Yes Comments Flat feet bilaterally, callus formation on left medial foot       PHQ2/9: Depression screen [facility] 2/9 10/08/2018 07/02/2018 03/23/2018 03/01/2018 12/01/2017  Decreased Interest 0 0 0 0 0  Down, Depressed, Hopeless 0 0 0 0 0  PHQ - 2 Score 0 0 0 0 0  Altered sleeping 0 - - - -  Tired, decreased energy 0 - - - -  Change in appetite 0 - - - -  Feeling bad or failure about yourself  0 - - - -  Trouble concentrating 0 - - - -  Moving slowly or fidgety/restless 0 - - - -  Suicidal thoughts 0 - - - -  PHQ-9 Score 0 - - - -  Difficult doing work/chores Not difficult at all - - - -    Fall Risk: Fall Risk  10/08/2018 07/02/2018 03/23/2018 03/01/2018 12/01/2017  Falls in the past year? No No No No No     Functional Status Survey: Is the patient deaf or have difficulty hearing?: No Does the patient have difficulty seeing, even when wearing glasses/contacts?: No Does the patient have difficulty concentrating, remembering, or making decisions?: No Does the patient have difficulty walking or climbing stairs?: No Does the patient have difficulty dressing or bathing?: No Does the patient have difficulty doing errands alone such as visiting a doctor's office or shopping?: No    Assessment & Plan    1. Type 2 diabetes mellitus with pressure callus (HCC)  - Hemoglobin A1c  2. Need for influenza vaccination  - Flu Vaccine QUAD 6+ mos PF IM (Fluarix Quad PF)  3. Benign essential HTN  - COMPLETE METABOLIC PANEL WITH GFR  4. Morbid obesity (HCC)  BMI above 35 with co-morbidities  5. History of thyroidectomy, total  - TSH  6. Diabetes mellitus type 2 with carpal tunnel syndrome [facility])  Doing well since surgery   7. Amputated finger, sequela ([place])   8. Hypothyroidism, postop  Recheck TSH, taking one pill daily and half on  Sundays  9. Dyslipidemia

## 2018-10-09 LAB — HEMOGLOBIN A1C
Hgb A1c MFr Bld: 6.5 % of total Hgb — ABNORMAL HIGH (ref <5.7)
Mean Plasma Glucose: 140 (calc)
eAG (mmol/L): 7.7 (calc)

## 2018-10-09 LAB — COMPLETE METABOLIC PANEL WITH GFR
AG RATIO: 1.2 (calc) (ref 1.0–2.5)
ALBUMIN MSPROF: 4.2 g/dL (ref 3.6–5.1)
ALKALINE PHOSPHATASE (APISO): 84 U/L (ref 33–130)
ALT: 19 U/L (ref 6–29)
AST: 21 U/L (ref 10–35)
BILIRUBIN TOTAL: 0.6 mg/dL (ref 0.2–1.2)
BUN: 14 mg/dL (ref 7–25)
CHLORIDE: 101 mmol/L (ref 98–110)
CO2: 29 mmol/L (ref 20–32)
Calcium: 9.3 mg/dL (ref 8.6–10.4)
Creat: 1.04 mg/dL (ref 0.50–1.05)
GFR, EST AFRICAN AMERICAN: 70 mL/min/{1.73_m2} (ref >=60)
GFR, Est Non African American: 60 mL/min/{1.73_m2} (ref >=60)
GLUCOSE: 90 mg/dL (ref 65–99)
Globulin: 3.4 g/dL (calc) (ref 1.9–3.7)
POTASSIUM: 3.8 mmol/L (ref 3.5–5.3)
SODIUM: 139 mmol/L (ref 135–146)
TOTAL PROTEIN: 7.6 g/dL (ref 6.1–8.1)

## 2018-10-09 LAB — TSH: TSH: 0.47 mIU/L (ref 0.40–4.50)

## 2019-02-08 ENCOUNTER — Ambulatory Visit (INDEPENDENT_AMBULATORY_CARE_PROVIDER_SITE_OTHER): Payer: BLUE CROSS/BLUE SHIELD | Admitting: Family Medicine

## 2019-02-08 ENCOUNTER — Encounter: Payer: Self-pay | Admitting: Family Medicine

## 2019-02-08 VITALS — BP 110/80 | HR 85 | Resp 16 | Ht 68.0 in | Wt 240.9 lb

## 2019-02-08 DIAGNOSIS — E890 Postprocedural hypothyroidism: Secondary | ICD-10-CM

## 2019-02-08 DIAGNOSIS — E11628 Type 2 diabetes mellitus with other skin complications: Secondary | ICD-10-CM | POA: Diagnosis not present

## 2019-02-08 DIAGNOSIS — E1129 Type 2 diabetes mellitus with other diabetic kidney complication: Secondary | ICD-10-CM

## 2019-02-08 DIAGNOSIS — I10 Essential (primary) hypertension: Secondary | ICD-10-CM

## 2019-02-08 DIAGNOSIS — E1141 Type 2 diabetes mellitus with diabetic mononeuropathy: Secondary | ICD-10-CM

## 2019-02-08 DIAGNOSIS — R809 Proteinuria, unspecified: Secondary | ICD-10-CM

## 2019-02-08 DIAGNOSIS — E785 Hyperlipidemia, unspecified: Secondary | ICD-10-CM

## 2019-02-08 DIAGNOSIS — G5600 Carpal tunnel syndrome, unspecified upper limb: Secondary | ICD-10-CM

## 2019-02-08 DIAGNOSIS — E1121 Type 2 diabetes mellitus with diabetic nephropathy: Secondary | ICD-10-CM

## 2019-02-08 DIAGNOSIS — L84 Corns and callosities: Secondary | ICD-10-CM

## 2019-02-08 MED ORDER — OLMESARTAN MEDOXOMIL-HCTZ 40-12.5 MG PO TABS: 1.0000 | ORAL_TABLET | Freq: Every day | ORAL | 1 refills | Status: DC

## 2019-02-08 MED ORDER — DULAGLUTIDE 1.5 MG/0.5ML ~~LOC~~ SOAJ: 1.5000 mg | SUBCUTANEOUS | 1 refills | Status: DC

## 2019-02-08 MED ORDER — METFORMIN HCL 500 MG PO TABS: 500.0000 mg | ORAL_TABLET | Freq: Every day | ORAL | 1 refills | Status: DC

## 2019-02-08 MED ORDER — ATORVASTATIN CALCIUM 40 MG PO TABS: 40.0000 mg | ORAL_TABLET | Freq: Every day | ORAL | 1 refills | Status: DC

## 2019-02-08 MED ORDER — LEVOTHYROXINE SODIUM 100 MCG PO TABS: 100.0000 ug | ORAL_TABLET | Freq: Every day | ORAL | 1 refills | Status: DC

## 2019-02-08 NOTE — Progress Notes (Signed)
Name: Samantha Copeland   MRN: [id number]    DOB: July 06, 1961   Date:02/08/2019       Progress Note  Subjective  Chief Complaint  Chief Complaint  Patient presents with  . Diabetes  . Hypertension  . Hyperlipidemia    HPI  DMII with nephropathy, callus formation left foot and carpal tunnel syndrome She is currently on Trulicity and metformin down to once daily otherwise feels symptoms of hypoglycemia. She denies hypoglycemic episodes, she states she drinks a lot of water for her health and has noticed polyuria but no polyphagia. ymptoms of carpal tunnel resolved with surgery in 2018, last urine micro was normal ( she is on ARB), she has callus formation on left foot and flat feet bilaterally and we will order diabetic shoes.. She is following a diabetic diet, eating fish twice weekly Exercising more and is due for an eye exam   HTN:bp is towards low end of normal but denies dizziness.  Compliant with medication . No chest pain or SOB or palpitation, or orthostatic changes. On Benicar HCTZ and doing well.   Hyperlipidemia: she is on Atorvastatin daily. Discussed ways to increase HDL, she has been eating more fish and also tree nuts. Recheck labs   Obesity:she lostalmost 10lbs since started on Ozempic,but not covered by insurance and weight is up again, but she has been exercising and eating healthier   Hypothyroidism: used to see Dr. Richardson Copeland had total thyroidectomy in December 2016, doing well, on Synthroid 100 mcg daily and half on Sundays. She  has been released by Dr. Gabriel Copeland. No tachycardia, constipation has resolved with dietary modification.Last TSH was normal., recheck labs today   OA and right meniscal tear: seen by Ortho, advised to increase activity and go back for steroid injection or surgery if no improvement. She  Is doing better since going to the gym and using elliptical  Patient Active Problem List   Diagnosis Date Noted  . Type 2 diabetes mellitus with  pressure callus ([place]) 07/02/2018  . Acute medial meniscus tear 04/06/2018  . Osteoarthritis, knee 04/06/2018  . Diabetic nephropathy associated with type 2 diabetes mellitus ([place]) 03/01/2018  . Trigger thumb of right hand 05/31/2017  . Status post de Quervain's release surgery 03/05/2017  . Hypothyroidism, postop 03/14/2016  . Benign essential HTN 05/30/2015  . Carpal tunnel syndrome 05/30/2015  . Dyslipidemia 05/30/2015  . Diabetes mellitus type 2 with carpal tunnel syndrome ([place]) 05/30/2015  . History of thyroidectomy, total 05/30/2015  . Microalbuminuria 05/30/2015  . Morbid obesity ([place]) 05/30/2015    Past Surgical History:  Procedure Laterality Date  . ABDOMINAL HYSTERECTOMY    . 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 BREAST EXCISIONAL BIOPSY Right    benign  . DORSAL COMPARTMENT RELEASE Left 01/18/2017   Procedure: RELEASE DORSAL COMPARTMENT (DEQUERVAIN);  Surgeon: Samantha Leep, MD;  Location: [facility] ORS;  Service: Orthopedics;  Laterality: Left;  . FINE NEEDLE ASPIRATION  10/01/2013   Thyroid-Dr. Gabriel Copeland  . THYROIDECTOMY N/A 12/09/2015   Procedure: THYROIDECTOMY;  Surgeon: Samantha Canterbury, MD;  Location: [facility] ORS;  Service: ENT;  Laterality: N/A;    Family History  Problem Relation Age of Onset  . Diabetes Mother   . Diabetes Father   . Hypertension Father   . Hyperlipidemia Father   . Cancer Father   . Diabetes Sister   . Breast cancer Neg Hx     Social History   Socioeconomic History  .  Marital status: Married    Spouse name: Samantha Copeland  . Number of children: 3  . Years of education: Not on file  . Highest education level: 12th grade  Occupational History  . 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 Financial resource strain: Not hard at all  . Food insecurity:    Worry: Never true    Inability: Never true  . Transportation needs:    Medical: No    Non-medical: No  Tobacco Use  .  Smoking status: Never Smoker  . Smokeless tobacco: Never Used  Substance and Sexual Activity  . Alcohol use: Yes    Alcohol/week: 0.0 standard drinks    Comment: occasionally  . Drug use: No  . Sexual activity: Yes    Partners: Male    Birth control/protection: Surgical  Lifestyle  . Physical activity:    Days per week: 3 days    Minutes per session: 60 min  . Stress: Not at all  Relationships  . Social connections:    Talks on phone: More than three times a week    Gets together: Three times a week    Attends religious service: 1 to 4 times per year    Active member of club or organization: No    Attends meetings of clubs or organizations: Never    Relationship status: Married  . Intimate partner violence:    Fear of current or ex partner: No    Emotionally abused: No    Physically abused: No    Forced sexual activity: No  Other Topics Concern  . Not on file  Social History Narrative   Lives with husband   Three grown children    One grandson      Current Outpatient Medications:  .  acetaminophen (TYLENOL) 325 MG tablet, Take 650 mg by mouth every 6 (six) hours as needed., Disp: , Rfl:  .  aspirin EC 81 MG tablet, Take 81 mg by mouth every evening. , Disp: , Rfl:  .  atorvastatin (LIPITOR) 40 MG tablet, Take 1 tablet (40 mg total) by mouth daily at 10 pm., Disp: 90 tablet, Rfl: 1 .  Calcium-Phosphorus-Vitamin D (CALCIUM GUMMIES PO), Take 2 tablets by mouth daily., Disp: , Rfl:  .  Dulaglutide (TRULICITY) 1.5 UK/0.2RK SOPN, Inject 1.5 mg into the skin once a week. Sundays, Disp: 12 pen, Rfl: 1 .  levothyroxine (SYNTHROID, LEVOTHROID) 100 MCG tablet, Take 1 tablet (100 mcg total) by mouth daily. And only half on Sundays, Disp: 90 tablet, Rfl: 1 .  metFORMIN (GLUCOPHAGE) 500 MG tablet, Take 1 tablet (500 mg total) by mouth daily with breakfast., Disp: 90 tablet, Rfl: 1 .  Multiple Vitamin (MULTIVITAMIN WITH MINERALS) TABS tablet, Take 1 tablet by mouth daily. ALIVE  MULTIVITAMIN, Disp: , Rfl:  .  olmesartan-hydrochlorothiazide (BENICAR HCT) 40-12.5 MG tablet, Take 1 tablet by mouth daily., Disp: 90 tablet, Rfl: 1 .  vitamin B-12 (CYANOCOBALAMIN) 100 MCG tablet, Take 100 mcg by mouth daily., Disp: , Rfl:   No Known Allergies  I personally reviewed active problem list, medication list, allergies, family history, social history with the patient/caregiver today.   ROS  Constitutional: Negative for fever or weight change.  Respiratory: Negative for cough and shortness of breath.   Cardiovascular: Negative for chest pain or palpitations.  Gastrointestinal: Negative for abdominal pain, no bowel changes.  Musculoskeletal: Negative for gait problem or joint swelling.  Skin: Negative for rash.  Neurological: Negative for dizziness or headache.  No other specific complaints in a complete review of systems (except as listed in HPI above).  Objective  Vitals:   02/08/19 0933  BP: 110/80  Pulse: 85  Resp: 16  SpO2: 98%  Weight: 240 lb 14.4 oz (109.3 kg)  Height: 5\' 8"  (1.727 m)    Body mass index is 36.63 kg/m.  Physical Exam  Constitutional: Patient appears well-developed and well-nourished. Obese  No distress.  HEENT: head atraumatic, normocephalic, pupils equal and reactive to light,neck supple, throat within normal limits Cardiovascular: Normal rate, regular rhythm and normal heart sounds.  No murmur heard. No BLE edema. Pulmonary/Chest: Effort normal and breath sounds normal. No respiratory distress. Abdominal: Soft.  There is no tenderness. Psychiatric: Patient has a normal mood and affect. behavior is normal. Judgment and thought content normal.  PHQ2/9: Depression screen [facility] 2/9 10/08/2018 07/02/2018 03/23/2018 03/01/2018 12/01/2017  Decreased Interest 0 0 0 0 0  Down, Depressed, Hopeless 0 0 0 0 0  PHQ - 2 Score 0 0 0 0 0  Altered sleeping 0 - - - -  Tired, decreased energy 0 - - - -  Change in appetite 0 - - - -  Feeling bad or  failure about yourself  0 - - - -  Trouble concentrating 0 - - - -  Moving slowly or fidgety/restless 0 - - - -  Suicidal thoughts 0 - - - -  PHQ-9 Score 0 - - - -  Difficult doing work/chores Not difficult at all - - - -     Fall Risk: Fall Risk  02/08/2019 10/08/2018 07/02/2018 03/23/2018 03/01/2018  Falls in the past year? 0 No No No No  Number falls in past yr: 0 - - - -  Injury with Fall? 0 - - - -    Assessment & Plan  1. Type 2 diabetes mellitus with pressure callus (HCC)  - Hemoglobin A1c  2. Morbid obesity ([place])  She gained weight, but has been eating healthy and exercising, advised to not get discouraged   3. Benign essential HTN  - COMPLETE METABOLIC PANEL WITH GFR - CBC with Differential/Platelet - olmesartan-hydrochlorothiazide (BENICAR HCT) 40-12.5 MG tablet; Take 1 tablet by mouth daily.  Dispense: 90 tablet; Refill: 1  4. Amputated finger, sequela ([place])   5. Type 2 diabetes mellitus with microalbuminuria, without long-term current use of insulin (HCC)   6. Dyslipidemia  - Lipid panel - atorvastatin (LIPITOR) 40 MG tablet; Take 1 tablet (40 mg total) by mouth daily at 10 pm.  Dispense: 90 tablet; Refill: 1  7. Hypothyroidism, postop  - TSH  8. 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)  - Dulaglutide (TRULICITY) 1.5 DG/6.4QI SOPN; Inject 1.5 mg into the skin once a week. Sundays  Dispense: 12 pen; Refill: 1 - metFORMIN (GLUCOPHAGE) 500 MG tablet; Take 1 tablet (500 mg total) by mouth daily with breakfast.  Dispense: 90 tablet; Refill: 1  9. Diabetic nephropathy associated with type 2 diabetes mellitus (HCC)  - Dulaglutide (TRULICITY) 1.5 HK/7.4QV SOPN; Inject 1.5 mg into the skin once a week. Sundays  Dispense: 12 pen; Refill: 1 - metFORMIN (GLUCOPHAGE) 500 MG tablet; Take 1 tablet (500 mg total) by mouth daily with breakfast.  Dispense: 90 tablet; Refill: 1 - olmesartan-hydrochlorothiazide (BENICAR HCT) 40-12.5 MG tablet; Take 1  tablet by mouth daily.  Dispense: 90 tablet; Refill: 1  10. History of thyroidectomy, total  - levothyroxine (SYNTHROID, LEVOTHROID) 100 MCG tablet;  Take 1 tablet (100 mcg total) by mouth daily. And only half on Sundays  Dispense: 90 tablet; Refill: 1

## 2019-02-09 LAB — COMPLETE METABOLIC PANEL WITH GFR
AG Ratio: 1.3 (calc) (ref 1.0–2.5)
ALT: 16 U/L (ref 6–29)
AST: 17 U/L (ref 10–35)
Albumin: 4.3 g/dL (ref 3.6–5.1)
Alkaline phosphatase (APISO): 97 U/L (ref 37–153)
BUN/Creatinine Ratio: 17 (calc) (ref 6–22)
BUN: 21 mg/dL (ref 7–25)
CALCIUM: 9.5 mg/dL (ref 8.6–10.4)
CO2: 28 mmol/L (ref 20–32)
CREATININE: 1.22 mg/dL — AB (ref 0.50–1.05)
Chloride: 101 mmol/L (ref 98–110)
GFR, EST NON AFRICAN AMERICAN: 49 mL/min/{1.73_m2} — AB (ref >=60)
GFR, Est African American: 57 mL/min/{1.73_m2} — ABNORMAL LOW (ref >=60)
Globulin: 3.2 g/dL (calc) (ref 1.9–3.7)
Glucose, Bld: 150 mg/dL — ABNORMAL HIGH (ref 65–99)
Potassium: 3.7 mmol/L (ref 3.5–5.3)
Sodium: 139 mmol/L (ref 135–146)
Total Bilirubin: 0.4 mg/dL (ref 0.2–1.2)
Total Protein: 7.5 g/dL (ref 6.1–8.1)

## 2019-02-09 LAB — CBC WITH DIFFERENTIAL/PLATELET
Absolute Monocytes: 620 cells/uL (ref 200–950)
BASOS ABS: 28 {cells}/uL (ref 0–200)
Basophils Relative: 0.3 %
EOS PCT: 2.1 %
Eosinophils Absolute: 197 cells/uL (ref 15–500)
HCT: 40.4 % (ref 35.0–45.0)
HEMOGLOBIN: 13.8 g/dL (ref 11.7–15.5)
Lymphs Abs: 3807 cells/uL (ref 850–3900)
MCH: 28.8 pg (ref 27.0–33.0)
MCHC: 34.2 g/dL (ref 32.0–36.0)
MCV: 84.3 fL (ref 80.0–100.0)
MONOS PCT: 6.6 %
MPV: 11.2 fL (ref 7.5–12.5)
NEUTROS ABS: 4747 {cells}/uL (ref 1500–7800)
Neutrophils Relative %: 50.5 %
Platelets: 223 10*3/uL (ref 140–400)
RBC: 4.79 10*6/uL (ref 3.80–5.10)
RDW: 14.6 % (ref 11.0–15.0)
Total Lymphocyte: 40.5 %
WBC: 9.4 10*3/uL (ref 3.8–10.8)

## 2019-02-09 LAB — TSH: TSH: 1.89 m[IU]/L (ref 0.40–4.50)

## 2019-02-09 LAB — LIPID PANEL
CHOLESTEROL: 149 mg/dL (ref <200)
HDL: 89 mg/dL (ref >=50)
LDL Cholesterol (Calc): 46 mg/dL (calc)
Non-HDL Cholesterol (Calc): 60 mg/dL (calc) (ref <130)
Total CHOL/HDL Ratio: 1.7 (calc) (ref <5.0)
Triglycerides: 67 mg/dL (ref <150)

## 2019-02-09 LAB — HEMOGLOBIN A1C
EAG (MMOL/L): 7.4 (calc)
HEMOGLOBIN A1C: 6.3 %{Hb} — AB (ref <5.7)
MEAN PLASMA GLUCOSE: 134 (calc)

## 2019-04-30 ENCOUNTER — Encounter: Payer: Self-pay | Admitting: Family Medicine

## 2019-04-30 LAB — HM DIABETES EYE EXAM

## 2019-06-12 ENCOUNTER — Encounter: Payer: Self-pay | Admitting: Family Medicine

## 2019-06-12 ENCOUNTER — Other Ambulatory Visit: Payer: Self-pay

## 2019-06-12 ENCOUNTER — Ambulatory Visit (INDEPENDENT_AMBULATORY_CARE_PROVIDER_SITE_OTHER): Payer: BC Managed Care – PPO | Admitting: Family Medicine

## 2019-06-12 VITALS — BP 110/70 | HR 83 | Temp 97.3°F | Resp 16 | Ht 68.0 in | Wt 235.3 lb

## 2019-06-12 DIAGNOSIS — L84 Corns and callosities: Secondary | ICD-10-CM | POA: Diagnosis not present

## 2019-06-12 DIAGNOSIS — R809 Proteinuria, unspecified: Secondary | ICD-10-CM | POA: Diagnosis not present

## 2019-06-12 DIAGNOSIS — S68119S Complete traumatic metacarpophalangeal amputation of unspecified finger, sequela: Secondary | ICD-10-CM | POA: Diagnosis not present

## 2019-06-12 DIAGNOSIS — E1121 Type 2 diabetes mellitus with diabetic nephropathy: Secondary | ICD-10-CM

## 2019-06-12 DIAGNOSIS — E890 Postprocedural hypothyroidism: Secondary | ICD-10-CM

## 2019-06-12 DIAGNOSIS — E11628 Type 2 diabetes mellitus with other skin complications: Secondary | ICD-10-CM

## 2019-06-12 LAB — POCT UA - MICROALBUMIN: Microalbumin Ur, POC: NEGATIVE mg/L

## 2019-06-12 LAB — POCT GLYCOSYLATED HEMOGLOBIN (HGB A1C): Hemoglobin A1C: 6.6 % — AB (ref 4.0–5.6)

## 2019-06-12 MED ORDER — LEVOTHYROXINE SODIUM 100 MCG PO TABS: 100.0000 ug | ORAL_TABLET | Freq: Every day | ORAL | 1 refills | Status: DC

## 2019-06-12 NOTE — Progress Notes (Signed)
Name: Samantha Copeland   MRN: [id number]    DOB: [birth year]   Date:06/12/2019       Progress Note  Subjective  Chief Complaint  Chief Complaint  Patient presents with  . Diabetes  . Hypertension  . Obesity  . Hypothyroidism    HPI  DMII with nephropathy  She is currently on Trulicity and metformin down to once daily otherwise feels symptoms of hypoglycemia. She denies hypoglycemic episodes, lowest glucose was 71 , she states she drinks a lot of water for her health and has noticed polyuria but no polyphagia. ymptoms of carpal tunnel resolved with surgery in 2018, last urine micro was normal ( she is on ARB), but last GFR dropped a little, callus improved with diabetic shoes and insoles. She is following a diabetic diet, eating fish twice . Eye exam is up to date   HTN:bp is towards low end of normal but denies dizziness.  Compliant with medication . No chest pain or SOB or palpitation, or orthostatic changes.On Benicar HCTZ and doing well.She states she is feeling well and does not want to change dose at this tmie  Hyperlipidemia: she is on Atorvastatin daily. HDL is excellent, LDL is at goal at 46. No myalgias   Obesity:she lostalmost10lbssince started on Ozempic,but not covered by insurance and weight is up again, she is now on Trulicity and lost 5 lbs since last visit . She is no longer going to the gym due to COVID-19 however she has just re-started walking around the track with her daughter   Hypothyroidism: used to see Dr. Richardson Landry had total thyroidectomy in December 2016, doing well, on Synthroid 100 mcg daily andhalf on Sundays. Shehas been released by Dr. Gabriel Carina. No tachycardia, no change in bowel movements.Last TSH was normal.   OA and right meniscal tear: seen by Ortho, she states not longer having pain, discussed importance of avoiding nsaid's    Patient Active Problem List   Diagnosis Date Noted  . 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 Acute medial meniscus tear 04/06/2018  . Osteoarthritis, knee 04/06/2018  . Diabetic nephropathy associated with type 2 diabetes mellitus ([place]) 03/01/2018  . Trigger thumb of right hand 05/31/2017  . Status post de Quervain's release surgery 03/05/2017  . Hypothyroidism, postop 03/14/2016  . Benign essential HTN 05/30/2015  . Carpal tunnel syndrome 05/30/2015  . Dyslipidemia 05/30/2015  . Diabetes mellitus type 2 with carpal tunnel syndrome ([place]) 05/30/2015  . History of thyroidectomy, total 05/30/2015  . Microalbuminuria 05/30/2015  . Morbid obesity ([place]) 05/30/2015    Past Surgical History:  Procedure Laterality Date  . ABDOMINAL HYSTERECTOMY    . 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 BREAST EXCISIONAL BIOPSY Right    benign  . DORSAL COMPARTMENT RELEASE Left 01/18/2017   Procedure: RELEASE DORSAL COMPARTMENT (DEQUERVAIN);  Surgeon: Dereck Leep, MD;  Location: [facility] ORS;  Service: Orthopedics;  Laterality: Left;  . FINE NEEDLE ASPIRATION  10/01/2013   Thyroid-Dr. Gabriel Carina  . THYROIDECTOMY N/A 12/09/2015   Procedure: THYROIDECTOMY;  Surgeon: Clyde Canterbury, MD;  Location: [facility] ORS;  Service: ENT;  Laterality: N/A;    Family History  Problem Relation Age of Onset  . Diabetes Mother   . Diabetes Father   . Hypertension Father   . Hyperlipidemia Father   . Cancer Father   . Diabetes Sister   . Breast cancer Neg Hx     Social History  Socioeconomic History  . Marital status: Married    Spouse name: Orpah Greek  . Number of children: 3  . Years of education: Not on file  . Highest education level: 12th grade  Occupational History  . 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 Financial resource strain: Not hard at all  . Food insecurity    Worry: Never true    Inability: Never true  . Transportation needs    Medical: No    Non-medical: No  Tobacco Use  . Smoking status: Never Smoker   . Smokeless tobacco: Never Used  Substance and Sexual Activity  . Alcohol use: Yes    Alcohol/week: 0.0 standard drinks    Comment: occasionally  . Drug use: No  . Sexual activity: Yes    Partners: Male    Birth control/protection: Surgical  Lifestyle  . Physical activity    Days per week: 3 days    Minutes per session: 60 min  . Stress: Not at all  Relationships  . Social connections    Talks on phone: More than three times a week    Gets together: Three times a week    Attends religious service: 1 to 4 times per year    Active member of club or organization: No    Attends meetings of clubs or organizations: Never    Relationship status: Married  . Intimate partner violence    Fear of current or ex partner: No    Emotionally abused: No    Physically abused: No    Forced sexual activity: No  Other Topics Concern  . Not on file  Social History Narrative   Lives with husband   Three grown children    One grandson      Current Outpatient Medications:  .  acetaminophen (TYLENOL) 325 MG tablet, Take 650 mg by mouth every 6 (six) hours as needed., Disp: , Rfl:  .  aspirin EC 81 MG tablet, Take 81 mg by mouth every evening. , Disp: , Rfl:  .  atorvastatin (LIPITOR) 40 MG tablet, Take 1 tablet (40 mg total) by mouth daily at 10 pm., Disp: 90 tablet, Rfl: 1 .  Calcium-Phosphorus-Vitamin D (CALCIUM GUMMIES PO), Take 2 tablets by mouth daily., Disp: , Rfl:  .  Dulaglutide (TRULICITY) 1.5 QQ/5.9DG SOPN, Inject 1.5 mg into the skin once a week. Sundays, Disp: 12 pen, Rfl: 1 .  levothyroxine (SYNTHROID, LEVOTHROID) 100 MCG tablet, Take 1 tablet (100 mcg total) by mouth daily. And only half on Sundays, Disp: 90 tablet, Rfl: 1 .  metFORMIN (GLUCOPHAGE) 500 MG tablet, Take 1 tablet (500 mg total) by mouth daily with breakfast., Disp: 90 tablet, Rfl: 1 .  Multiple Vitamin (MULTIVITAMIN WITH MINERALS) TABS tablet, Take 1 tablet by mouth daily. ALIVE MULTIVITAMIN, Disp: , Rfl:  .   olmesartan-hydrochlorothiazide (BENICAR HCT) 40-12.5 MG tablet, Take 1 tablet by mouth daily., Disp: 90 tablet, Rfl: 1 .  vitamin B-12 (CYANOCOBALAMIN) 100 MCG tablet, Take 100 mcg by mouth daily., Disp: , Rfl:   No Known Allergies  I personally reviewed active problem list, medication list, allergies, family history, social history, health maintenance with the patient/caregiver today.   ROS  Constitutional: Negative for fever or weight change.  Respiratory: Negative for cough and shortness of breath.   Cardiovascular: Negative for chest pain or palpitations.  Gastrointestinal: Negative for abdominal pain, no bowel changes.  Musculoskeletal: Negative for gait problem or joint  swelling.  Skin: Negative for rash.  Neurological: Negative for dizziness or headache.  No other specific complaints in a complete review of systems (except as listed in HPI above).   Objective  Vitals:   06/12/19 0759  BP: 110/70  Resp: 16  Temp: (!) 97.3 F (36.3 C)  TempSrc: Oral  Weight: 235 lb 4.8 oz (106.7 kg)  Height: 5\' 8"  (1.727 m)    Body mass index is 35.78 kg/m.  Physical Exam  Constitutional: Patient appears well-developed and well-nourished. Obese  No distress.  HEENT: head atraumatic, normocephalic, pupils equal and reactive to light,  neck supple, oral mucosa not done  Cardiovascular: Normal rate, regular rhythm and normal heart sounds.  No murmur heard. No BLE edema. Pulmonary/Chest: Effort normal and breath sounds normal. No respiratory distress. Abdominal: Soft.  There is no tenderness. Muscular Skeletal: right index finger s/p traumatic amputation, well healed Psychiatric: Patient has a normal mood and affect. behavior is normal. Judgment and thought content normal.  Recent Results (from the past 2160 hour(s))  HM DIABETES EYE EXAM     Status: None   Collection Time: 04/30/19 12:00 AM  Result Value Ref Range   HM Diabetic Eye Exam No Retinopathy No Retinopathy    Comment:  [facility], Dr. Edison Pace      PHQ2/9: Depression screen [facility] 2/9 06/12/2019 10/08/2018 07/02/2018 03/23/2018 03/01/2018  Decreased Interest 0 0 0 0 0  Down, Depressed, Hopeless 0 0 0 0 0  PHQ - 2 Score 0 0 0 0 0  Altered sleeping 0 0 - - -  Tired, decreased energy 0 0 - - -  Change in appetite 0 0 - - -  Feeling bad or failure about yourself  0 0 - - -  Trouble concentrating 0 0 - - -  Moving slowly or fidgety/restless 0 0 - - -  Suicidal thoughts 0 0 - - -  PHQ-9 Score 0 0 - - -  Difficult doing work/chores - Not difficult at all - - -    phq 9 is negative   Fall Risk: Fall Risk  06/12/2019 02/08/2019 10/08/2018 07/02/2018 03/23/2018  Falls in the past year? 0 0 No No No  Number falls in past yr: 0 0 - - -  Injury with Fall? 0 0 - - -     Functional Status Survey: Is the patient deaf or have difficulty hearing?: No Does the patient have difficulty seeing, even when wearing glasses/contacts?: No Does the patient have difficulty concentrating, remembering, or making decisions?: No Does the patient have difficulty walking or climbing stairs?: No Does the patient have difficulty dressing or bathing?: No Does the patient have difficulty doing errands alone such as visiting a doctor's office or shopping?: No    Assessment & Plan  1. 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 Amputated finger, sequela ([place])   3. Morbid obesity ([place])  Discussed with the patient the risk posed by an increased BMI. Discussed importance of portion control, calorie counting and at least 150 minutes of physical activity weekly. Avoid sweet beverages and drink more water. Eat at least 6 servings of fruit and vegetables daily   4. Microalbuminuria  Recheck level today   5. Diabetic nephropathy associated with type 2 diabetes mellitus (HCC)  GFR dropped a little we will recheck next visit   6. Hypothyroidism, postop  Last TSH was at goal

## 2019-06-19 ENCOUNTER — Other Ambulatory Visit: Payer: Self-pay | Admitting: Family Medicine

## 2019-06-19 DIAGNOSIS — Z1231 Encounter for screening mammogram for malignant neoplasm of breast: Secondary | ICD-10-CM

## 2019-07-29 ENCOUNTER — Other Ambulatory Visit: Payer: Self-pay | Admitting: Family Medicine

## 2019-07-29 ENCOUNTER — Ambulatory Visit
Admission: RE | Admit: 2019-07-29 | Discharge: 2019-07-29 | Disposition: A | Discharge status: Home / Self Care | Payer: BC Managed Care – PPO | Source: Ambulatory Visit | Attending: Family Medicine | Admitting: Family Medicine

## 2019-07-29 DIAGNOSIS — Z1231 Encounter for screening mammogram for malignant neoplasm of breast: Secondary | ICD-10-CM | POA: Insufficient documentation

## 2019-07-29 DIAGNOSIS — E1121 Type 2 diabetes mellitus with diabetic nephropathy: Secondary | ICD-10-CM

## 2019-07-29 DIAGNOSIS — E1141 Type 2 diabetes mellitus with diabetic mononeuropathy: Secondary | ICD-10-CM

## 2019-08-08 ENCOUNTER — Telehealth: Payer: Self-pay | Admitting: Family Medicine

## 2019-08-08 NOTE — Telephone Encounter (Signed)
Patient is calling regarding her low BP on 06/12/19. Patient states that Dr. Ancil Boozer told her her BP was low during the OV. She is having blurry vision. But reports that she has not taking her BP recently. She would like to know could the low BP contribute to the blurry vision? Please advise [phone]

## 2019-08-09 NOTE — Telephone Encounter (Signed)
Her BP reading have been perfect. Her last diabetic eye exam was on 04/2019, no diabetic retinopathy was detected. She reports blurred vision. Please advise.

## 2019-08-13 NOTE — Telephone Encounter (Signed)
Patient called.  Patient aware.  

## 2019-08-24 ENCOUNTER — Other Ambulatory Visit: Payer: Self-pay | Admitting: Family Medicine

## 2019-08-24 DIAGNOSIS — I10 Essential (primary) hypertension: Secondary | ICD-10-CM

## 2019-08-24 DIAGNOSIS — E1121 Type 2 diabetes mellitus with diabetic nephropathy: Secondary | ICD-10-CM

## 2019-08-24 DIAGNOSIS — E1141 Type 2 diabetes mellitus with diabetic mononeuropathy: Secondary | ICD-10-CM

## 2019-08-24 DIAGNOSIS — E785 Hyperlipidemia, unspecified: Secondary | ICD-10-CM

## 2019-08-24 DIAGNOSIS — G5600 Carpal tunnel syndrome, unspecified upper limb: Secondary | ICD-10-CM

## 2019-09-16 ENCOUNTER — Ambulatory Visit (INDEPENDENT_AMBULATORY_CARE_PROVIDER_SITE_OTHER): Payer: BC Managed Care – PPO | Admitting: Family Medicine

## 2019-09-16 ENCOUNTER — Encounter: Payer: Self-pay | Admitting: Family Medicine

## 2019-09-16 ENCOUNTER — Other Ambulatory Visit: Payer: Self-pay

## 2019-09-16 VITALS — BP 112/79 | HR 72 | Temp 96.9°F | Resp 16 | Ht 68.0 in | Wt 241.1 lb

## 2019-09-16 DIAGNOSIS — L84 Corns and callosities: Secondary | ICD-10-CM | POA: Diagnosis not present

## 2019-09-16 DIAGNOSIS — M1711 Unilateral primary osteoarthritis, right knee: Secondary | ICD-10-CM

## 2019-09-16 DIAGNOSIS — G5600 Carpal tunnel syndrome, unspecified upper limb: Secondary | ICD-10-CM

## 2019-09-16 DIAGNOSIS — E11628 Type 2 diabetes mellitus with other skin complications: Secondary | ICD-10-CM

## 2019-09-16 DIAGNOSIS — Z23 Encounter for immunization: Secondary | ICD-10-CM | POA: Diagnosis not present

## 2019-09-16 DIAGNOSIS — I10 Essential (primary) hypertension: Secondary | ICD-10-CM

## 2019-09-16 DIAGNOSIS — E1141 Type 2 diabetes mellitus with diabetic mononeuropathy: Secondary | ICD-10-CM

## 2019-09-16 DIAGNOSIS — E1121 Type 2 diabetes mellitus with diabetic nephropathy: Secondary | ICD-10-CM

## 2019-09-16 DIAGNOSIS — E890 Postprocedural hypothyroidism: Secondary | ICD-10-CM

## 2019-09-16 DIAGNOSIS — E785 Hyperlipidemia, unspecified: Secondary | ICD-10-CM | POA: Diagnosis not present

## 2019-09-16 LAB — POCT GLYCOSYLATED HEMOGLOBIN (HGB A1C): HbA1c, POC (controlled diabetic range): 6.5 % (ref 0.0–7.0)

## 2019-09-16 MED ORDER — TRULICITY 1.5 MG/0.5ML ~~LOC~~ SOAJ: SUBCUTANEOUS | 0 refills | Status: DC

## 2019-09-16 MED ORDER — METFORMIN HCL 500 MG PO TABS: 500.0000 mg | ORAL_TABLET | Freq: Every day | ORAL | 0 refills | Status: DC

## 2019-09-16 MED ORDER — OLMESARTAN MEDOXOMIL-HCTZ 40-12.5 MG PO TABS: 1.0000 | ORAL_TABLET | Freq: Every day | ORAL | 0 refills | Status: DC

## 2019-09-16 MED ORDER — ATORVASTATIN CALCIUM 40 MG PO TABS: 40.0000 mg | ORAL_TABLET | Freq: Every day | ORAL | 0 refills | Status: DC

## 2019-09-16 NOTE — Progress Notes (Signed)
Name: Samantha Copeland   MRN: [id number]    DOB: May 13, 1961   Date:09/16/2019       Progress Note  Subjective  Chief Complaint  Chief Complaint  Patient presents with  . Medication Refill    3 month F/U  . Diabetes  . Hypertension  . Hypothyroidism  . Hyperlipidemia  . Obesity    HPI  DMII with nephropathy She is currently on Trulicity and metformin down to once daily otherwise feels symptoms of hypoglycemia.She denies hypoglycemic episodes, glucose 80's-90's fasting, she states she drinks a lot of water for her health and has noticed polyuria but no polyphagia.symptoms of carpal tunnel resolved with surgery in 2018, last urine micro was normal ( she is on ARB), but last GFR dropped a little we will recheck labs today, callus improved with diabetic shoes and insoles. She is following a diabetic diet, eating fish twice . Eye exam is up to date   HTN:bp is towards low end of normal but denies dizziness, today 112/79 Compliant with medication . No chest pain or SOB or palpitation, or orthostatic changes.On Benicar HCTZ and doing well.We will continue current dose   Hyperlipidemia: she is on Atorvastatin daily. HDL is excellent, LDL is at goal at 46. No myalgias We will recheck yearly   Obesity:she lostalmost10lbssince started on Ozempic,but medication was no longer covered by insurance and was switched to Trulicity, weight is stable since Feb. She is no longer going to the gym due to COVID-19 however she stopped walking around the track with her daughter, and she does not like walking alone, discussed listening to a Podcast   Hypothyroidism:used to seeDr. Richardson Landry had total thyroidectomy in December 2016, doing well, on Synthroid 100 mcg daily andhalf on Sundays. Shehas been released by Dr. Gabriel Carina. No tachycardia, no change in bowel movements.She is due for repeat labs today   OA and right meniscal tear: seen by Ortho, she states not longer having pain,  discussed importance of avoiding nsaid's , she takes Tylenol very seldom. No current problems   Left toe injury: she was at work and dropped a Music therapist on her foot ( the wood broke and fell on her foot), she reported to work and was given two days off, she states today she is doing well  Patient Active Problem List   Diagnosis Date Noted  . Amputated finger, sequela ([place]) 06/12/2019  . 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 Acute medial meniscus tear 04/06/2018  . Osteoarthritis, knee 04/06/2018  . Diabetic nephropathy associated with type 2 diabetes mellitus ([place]) 03/01/2018  . Trigger thumb of right hand 05/31/2017  . Status post de Quervain's release surgery 03/05/2017  . Hypothyroidism, postop 03/14/2016  . Benign essential HTN 05/30/2015  . Carpal tunnel syndrome 05/30/2015  . Dyslipidemia 05/30/2015  . 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 History of thyroidectomy, total 05/30/2015  . Microalbuminuria 05/30/2015  . Morbid obesity ([place]) 05/30/2015    Past Surgical History:  Procedure Laterality Date  . ABDOMINAL HYSTERECTOMY    . 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 BREAST EXCISIONAL BIOPSY Right    benign  . DORSAL COMPARTMENT RELEASE Left 01/18/2017   Procedure: RELEASE DORSAL COMPARTMENT (DEQUERVAIN);  Surgeon: Dereck Leep, MD;  Location: [facility] ORS;  Service: Orthopedics;  Laterality: Left;  . FINE NEEDLE ASPIRATION  10/01/2013   Thyroid-Dr. Gabriel Carina  . THYROIDECTOMY N/A 12/09/2015  Procedure: THYROIDECTOMY;  Surgeon: Clyde Canterbury, MD;  Location: [facility] ORS;  Service: ENT;  Laterality: N/A;    Family History  Problem Relation Age of Onset  . Diabetes Mother   . Diabetes Father   . Hypertension Father   . Hyperlipidemia Father   . Cancer Father   . Diabetes Sister   . Breast cancer Neg Hx     Social History   Socioeconomic History  . Marital status: Married     Spouse name: Orpah Greek  . Number of children: 3  . Years of education: Not on file  . Highest education level: 12th grade  Occupational History  . 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 Financial resource strain: Not hard at all  . Food insecurity    Worry: Never true    Inability: Never true  . Transportation needs    Medical: No    Non-medical: No  Tobacco Use  . Smoking status: Never Smoker  . Smokeless tobacco: Never Used  Substance and Sexual Activity  . Alcohol use: Yes    Alcohol/week: 0.0 standard drinks    Comment: occasionally  . Drug use: No  . Sexual activity: Yes    Partners: Male    Birth control/protection: Surgical  Lifestyle  . Physical activity    Days per week: 3 days    Minutes per session: 60 min  . Stress: Not at all  Relationships  . Social connections    Talks on phone: More than three times a week    Gets together: Three times a week    Attends religious service: 1 to 4 times per year    Active member of club or organization: No    Attends meetings of clubs or organizations: Never    Relationship status: Married  . Intimate partner violence    Fear of current or ex partner: No    Emotionally abused: No    Physically abused: No    Forced sexual activity: No  Other Topics Concern  . Not on file  Social History Narrative   Lives with husband   Three grown children    One grandson      Current Outpatient Medications:  .  acetaminophen (TYLENOL) 325 MG tablet, Take 650 mg by mouth every 6 (six) hours as needed., Disp: , Rfl:  .  aspirin EC 81 MG tablet, Take 81 mg by mouth every evening. , Disp: , Rfl:  .  atorvastatin (LIPITOR) 40 MG tablet, Take 1 tablet (40 mg total) by mouth daily., Disp: 90 tablet, Rfl: 0 .  Calcium-Phosphorus-Vitamin D (CALCIUM GUMMIES PO), Take 2 tablets by mouth daily., Disp: , Rfl:  .  Dulaglutide (TRULICITY) 1.5 0000000 SOPN, INJECT 1.5MG  INTO THE SKIN ONCE A WEEK ON  SUNDAYS., Disp: 12 mL, Rfl: 0 .  levothyroxine (SYNTHROID) 100 MCG tablet, Take 1 tablet (100 mcg total) by mouth daily. And only half on Sundays, Disp: 90 tablet, Rfl: 1 .  metFORMIN (GLUCOPHAGE) 500 MG tablet, Take 1 tablet (500 mg total) by mouth daily with breakfast., Disp: 90 tablet, Rfl: 0 .  Multiple Vitamin (MULTIVITAMIN WITH MINERALS) TABS tablet, Take 1 tablet by mouth daily. ALIVE MULTIVITAMIN, Disp: , Rfl:  .  olmesartan-hydrochlorothiazide (BENICAR HCT) 40-12.5 MG tablet, Take 1 tablet by mouth daily., Disp: 90 tablet, Rfl: 0 .  vitamin B-12 (CYANOCOBALAMIN) 100 MCG tablet, Take 100 mcg by mouth daily., Disp: , Rfl:   No  Known Allergies  I personally reviewed active problem list, medication list, allergies, family history, social history, health maintenance with the patient/caregiver today.   ROS  Constitutional: Negative for fever or weight change.  Respiratory: Negative for cough and shortness of breath.   Cardiovascular: Negative for chest pain or palpitations.  Gastrointestinal: Negative for abdominal pain, no bowel changes.  Musculoskeletal: Negative for gait problem or joint swelling.  Skin: Negative for rash.  Neurological: Negative for dizziness or headache.  No other specific complaints in a complete review of systems (except as listed in HPI above).  Objective  Vitals:   09/16/19 0936  BP: 112/79  Pulse: 72  Resp: 16  Temp: (!) 96.9 F (36.1 C)  TempSrc: Temporal  SpO2: 96%  Weight: 241 lb 1.6 oz (109.4 kg)  Height: 5\' 8"  (1.727 m)    Body mass index is 36.66 kg/m.  Physical Exam  Constitutional: Patient appears well-developed and well-nourished. Obese  No distress.  HEENT: head atraumatic, normocephalic, pupils equal and reactive to light Cardiovascular: Normal rate, regular rhythm and normal heart sounds.  No murmur heard. No BLE edema. Pulmonary/Chest: Effort normal and breath sounds normal. No respiratory distress. Abdominal: Soft.  There is  no tenderness. Psychiatric: Patient has a normal mood and affect. behavior is normal. Judgment and thought content normal. Muscular Skeletal: bruised first left toe, tender to touch and decrease rom, advised her to contact her employer and get a referral to see podiatrist    Recent Results (from the past 2160 hour(s))  POCT HgB A1C     Status: Normal   Collection Time: 09/16/19  9:15 AM  Result Value Ref Range   Hemoglobin A1C     HbA1c POC (<> result, manual entry)     HbA1c, POC (prediabetic range)     HbA1c, POC (controlled diabetic range) 6.5 0.0 - 7.0 %    Diabetic Foot Exam: Diabetic Foot Exam - Simple   Simple Foot Form Diabetic Foot exam was performed with the following findings: Yes 09/16/2019  9:35 AM  Visual Inspection See comments: Yes Sensation Testing Intact to touch and monofilament testing bilaterally: Yes Pulse Check Posterior Tibialis and Dorsalis pulse intact bilaterally: Yes Comments Left first toe is swollen , bruised and tender to touch     PHQ2/9: Depression screen [facility] 2/9 09/16/2019 06/12/2019 10/08/2018 07/02/2018 03/23/2018  Decreased Interest 0 0 0 0 0  Down, Depressed, Hopeless 0 0 0 0 0  PHQ - 2 Score 0 0 0 0 0  Altered sleeping 0 0 0 - -  Tired, decreased energy 0 0 0 - -  Change in appetite 0 0 0 - -  Feeling bad or failure about yourself  0 0 0 - -  Trouble concentrating 0 0 0 - -  Moving slowly or fidgety/restless 0 0 0 - -  Suicidal thoughts 0 0 0 - -  PHQ-9 Score 0 0 0 - -  Difficult doing work/chores Not difficult at all - Not difficult at all - -    phq 9 is negative   Fall Risk: Fall Risk  09/16/2019 06/12/2019 02/08/2019 10/08/2018 07/02/2018  Falls in the past year? 0 0 0 No No  Number falls in past yr: 0 0 0 - -  Injury with Fall? 0 0 0 - -    Functional Status Survey: Is the patient deaf or have difficulty hearing?: No Does the patient have difficulty seeing, even when wearing glasses/contacts?: Yes Does the patient have  difficulty concentrating,  remembering, or making decisions?: No Does the patient have difficulty walking or climbing stairs?: No Does the patient have difficulty dressing or bathing?: No Does the patient have difficulty doing errands alone such as visiting a doctor's office or shopping?: No    Assessment & Plan   1. 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 Encounter for immunization  - Flu Vaccine MDCK QUAD PF  3. Dyslipidemia  - atorvastatin (LIPITOR) 40 MG tablet; Take 1 tablet (40 mg total) by mouth daily.  Dispense: 90 tablet; Refill: 0  4. Diabetic nephropathy associated with type 2 diabetes mellitus (HCC)  - metFORMIN (GLUCOPHAGE) 500 MG tablet; Take 1 tablet (500 mg total) by mouth daily with breakfast.  Dispense: 90 tablet; Refill: 0 - olmesartan-hydrochlorothiazide (BENICAR HCT) 40-12.5 MG tablet; Take 1 tablet by mouth daily.  Dispense: 90 tablet; Refill: 0 - Dulaglutide (TRULICITY) 1.5 0000000 SOPN; INJECT 1.5MG  INTO THE SKIN ONCE A WEEK ON SUNDAYS.  Dispense: 12 mL; Refill: 0  5. Morbid obesity ([place])  Discussed with the patient the risk posed by an increased BMI. Discussed importance of portion control, calorie counting and at least 150 minutes of physical activity weekly. Avoid sweet beverages and drink more water. Eat at least 6 servings of fruit and vegetables daily  BMI above 35 and co-morbidities   6. Hypothyroidism, postop  - TSH  7. Benign essential HTN  - COMPLETE METABOLIC PANEL WITH GFR - olmesartan-hydrochlorothiazide (BENICAR HCT) 40-12.5 MG tablet; Take 1 tablet by mouth daily.  Dispense: 90 tablet; Refill: 0  8. Primary osteoarthritis of right knee  Doing well at this time  9. 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)  - metFORMIN (GLUCOPHAGE) 500 MG tablet; Take 1 tablet (500 mg total) by mouth daily with breakfast.  Dispense: 90 tablet; Refill: 0 - Dulaglutide (TRULICITY) 1.5 0000000 SOPN;  INJECT 1.5MG  INTO THE SKIN ONCE A WEEK ON SUNDAYS.  Dispense: 12 mL; Refill: 0

## 2019-09-17 LAB — COMPLETE METABOLIC PANEL WITH GFR
AG Ratio: 1.3 (calc) (ref 1.0–2.5)
ALT: 16 U/L (ref 6–29)
AST: 17 U/L (ref 10–35)
Albumin: 3.9 g/dL (ref 3.6–5.1)
Alkaline phosphatase (APISO): 86 U/L (ref 37–153)
BUN: 15 mg/dL (ref 7–25)
CO2: 28 mmol/L (ref 20–32)
Calcium: 9.2 mg/dL (ref 8.6–10.4)
Chloride: 103 mmol/L (ref 98–110)
Creat: 1 mg/dL (ref 0.50–1.05)
GFR, Est African American: 72 mL/min/{1.73_m2} (ref >=60)
GFR, Est Non African American: 62 mL/min/{1.73_m2} (ref >=60)
Globulin: 3.1 g/dL (calc) (ref 1.9–3.7)
Glucose, Bld: 85 mg/dL (ref 65–99)
Potassium: 4 mmol/L (ref 3.5–5.3)
Sodium: 141 mmol/L (ref 135–146)
Total Bilirubin: 0.5 mg/dL (ref 0.2–1.2)
Total Protein: 7 g/dL (ref 6.1–8.1)

## 2019-09-17 LAB — TSH: TSH: 1.35 mIU/L (ref 0.40–4.50)

## 2020-01-14 ENCOUNTER — Other Ambulatory Visit: Payer: Self-pay | Admitting: Family Medicine

## 2020-01-14 DIAGNOSIS — E1141 Type 2 diabetes mellitus with diabetic mononeuropathy: Secondary | ICD-10-CM

## 2020-01-14 DIAGNOSIS — E1121 Type 2 diabetes mellitus with diabetic nephropathy: Secondary | ICD-10-CM

## 2020-01-20 ENCOUNTER — Ambulatory Visit (INDEPENDENT_AMBULATORY_CARE_PROVIDER_SITE_OTHER): Payer: BC Managed Care – PPO | Admitting: Family Medicine

## 2020-01-20 ENCOUNTER — Encounter: Payer: Self-pay | Admitting: Family Medicine

## 2020-01-20 ENCOUNTER — Other Ambulatory Visit: Payer: Self-pay

## 2020-01-20 DIAGNOSIS — M1711 Unilateral primary osteoarthritis, right knee: Secondary | ICD-10-CM

## 2020-01-20 DIAGNOSIS — E785 Hyperlipidemia, unspecified: Secondary | ICD-10-CM | POA: Diagnosis not present

## 2020-01-20 DIAGNOSIS — I10 Essential (primary) hypertension: Secondary | ICD-10-CM

## 2020-01-20 DIAGNOSIS — E1169 Type 2 diabetes mellitus with other specified complication: Secondary | ICD-10-CM

## 2020-01-20 DIAGNOSIS — Z566 Other physical and mental strain related to work: Secondary | ICD-10-CM

## 2020-01-20 DIAGNOSIS — E1121 Type 2 diabetes mellitus with diabetic nephropathy: Secondary | ICD-10-CM

## 2020-01-20 DIAGNOSIS — S68119S Complete traumatic metacarpophalangeal amputation of unspecified finger, sequela: Secondary | ICD-10-CM

## 2020-01-20 DIAGNOSIS — E890 Postprocedural hypothyroidism: Secondary | ICD-10-CM

## 2020-01-20 DIAGNOSIS — E669 Obesity, unspecified: Secondary | ICD-10-CM

## 2020-01-20 LAB — COMPLETE METABOLIC PANEL WITH GFR
AG Ratio: 1.4 (calc) (ref 1.0–2.5)
ALT: 14 U/L (ref 6–29)
AST: 16 U/L (ref 10–35)
Albumin: 4.2 g/dL (ref 3.6–5.1)
Alkaline phosphatase (APISO): 94 U/L (ref 37–153)
BUN: 14 mg/dL (ref 7–25)
CO2: 31 mmol/L (ref 20–32)
Calcium: 9.4 mg/dL (ref 8.6–10.4)
Chloride: 103 mmol/L (ref 98–110)
Creat: 0.98 mg/dL (ref 0.50–1.05)
GFR, Est African American: 74 mL/min/{1.73_m2} (ref >=60)
GFR, Est Non African American: 64 mL/min/{1.73_m2} (ref >=60)
Globulin: 3 g/dL (calc) (ref 1.9–3.7)
Glucose, Bld: 97 mg/dL (ref 65–99)
Potassium: 4.4 mmol/L (ref 3.5–5.3)
Sodium: 141 mmol/L (ref 135–146)
Total Bilirubin: 0.6 mg/dL (ref 0.2–1.2)
Total Protein: 7.2 g/dL (ref 6.1–8.1)

## 2020-01-20 LAB — CBC WITH DIFFERENTIAL/PLATELET
Absolute Monocytes: 578 cells/uL (ref 200–950)
Basophils Absolute: 18 cells/uL (ref 0–200)
Basophils Relative: 0.3 %
Eosinophils Absolute: 118 cells/uL (ref 15–500)
Eosinophils Relative: 2 %
HCT: 42.7 % (ref 35.0–45.0)
Hemoglobin: 14.2 g/dL (ref 11.7–15.5)
Lymphs Abs: 2791 cells/uL (ref 850–3900)
MCH: 28.1 pg (ref 27.0–33.0)
MCHC: 33.3 g/dL (ref 32.0–36.0)
MCV: 84.4 fL (ref 80.0–100.0)
MPV: 10.7 fL (ref 7.5–12.5)
Monocytes Relative: 9.8 %
Neutro Abs: 2395 cells/uL (ref 1500–7800)
Neutrophils Relative %: 40.6 %
Platelets: 238 10*3/uL (ref 140–400)
RBC: 5.06 10*6/uL (ref 3.80–5.10)
RDW: 14.5 % (ref 11.0–15.0)
Total Lymphocyte: 47.3 %
WBC: 5.9 10*3/uL (ref 3.8–10.8)

## 2020-01-20 LAB — LIPID PANEL
Cholesterol: 145 mg/dL (ref <200)
HDL: 88 mg/dL (ref >=50)
LDL Cholesterol (Calc): 46 mg/dL (calc)
Non-HDL Cholesterol (Calc): 57 mg/dL (calc) (ref <130)
Total CHOL/HDL Ratio: 1.6 (calc) (ref <5.0)
Triglycerides: 37 mg/dL (ref <150)

## 2020-01-20 LAB — POCT GLYCOSYLATED HEMOGLOBIN (HGB A1C): HbA1c, POC (controlled diabetic range): 6.8 % (ref 0.0–7.0)

## 2020-01-20 MED ORDER — LEVOTHYROXINE SODIUM 100 MCG PO TABS: 100.0000 ug | ORAL_TABLET | Freq: Every day | ORAL | 1 refills | Status: DC

## 2020-01-20 MED ORDER — ATORVASTATIN CALCIUM 40 MG PO TABS: 40.0000 mg | ORAL_TABLET | Freq: Every day | ORAL | 1 refills | Status: DC

## 2020-01-20 MED ORDER — TRULICITY 1.5 MG/0.5ML ~~LOC~~ SOAJ: 1.5000 mg | SUBCUTANEOUS | 1 refills | Status: DC

## 2020-01-20 MED ORDER — OLMESARTAN MEDOXOMIL-HCTZ 40-12.5 MG PO TABS: 1.0000 | ORAL_TABLET | Freq: Every day | ORAL | 0 refills | Status: DC

## 2020-01-20 MED ORDER — METFORMIN HCL 500 MG PO TABS: 500.0000 mg | ORAL_TABLET | Freq: Every day | ORAL | 0 refills | Status: DC

## 2020-01-20 NOTE — Progress Notes (Signed)
Name: Samantha Copeland   MRN: [id number]    DOB: Aug 02, 1961   Date:01/20/2020       Progress Note  Subjective  Chief Complaint  Chief Complaint  Patient presents with  . Medication Refill    4 month F/U  . Diabetes    Lowest BS has been 78 Average-120's  . Hypertension    Denies any symptoms  . Hyperlipidemia  . Obesity  . Hypothyroidism    HPI  DMII with nephropathy She is currently on Trulicity and metformin down to once daily otherwise feels symptoms of hypoglycemia, however A1C has gone up a little since last visit from 6.5% to 6.8%, she will monitor her diet and consider going up to BID again .She denies hypoglycemic episodes, glucose is in the low 100's now , she states she drinks a lot of water for her health and has noticed polyuria but no polyphagia.symptoms of carpal tunnel resolved with surgery in 2018, last urine micro was normal ( she is on ARB), but last GFR dropped a little we will recheck labs today, callus improved with diabetic shoes and insoles. She is following a diabetic diet, eating fish twice. Discussed stress also increases glucose levels  HTN:bp is towards low end of normal and stable. Compliant with medication . No chest pain or SOB or palpitation, or orthostatic changes.On Benicar HCTZ and doing well.She does not want to go down on the dose at this time, advised to try taking half pill starting three days prior to next visit    Hyperlipidemia: she is on Atorvastatin daily.HDL is excellent, LDL is at goal at 46. No myalgiasReviewed last labs  Obesity: BMI she is on Trulicity, stressed out at work, work load is higher due to COVID-19. Not exercising, but she states she is going to try working out again   Hypothyroidism:used to seeDr. Richardson Landry had total thyroidectomy in December 2016, doing well, on Synthroid 100 mcg daily andhalf on Sundays. Shehas been released by Dr. Gabriel Carina. No tachycardia,no change in bowel movements. Last TSH was at goal    OA and right meniscal tear: seen by Valentino Nose states not longer having pain, discussed importance of avoiding nsaid's, she takes Tylenol very seldom. 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   Patient Active Problem List   Diagnosis Date Noted  . Amputated finger, sequela ([place]) 06/12/2019  . Type 2 diabetes mellitus with pressure callus ([place]) 07/02/2018  . Acute medial meniscus tear 04/06/2018  . Osteoarthritis, knee 04/06/2018  . Diabetic nephropathy associated with type 2 diabetes mellitus ([place]) 03/01/2018  . Trigger thumb of right hand 05/31/2017  . Status post de Quervain's release surgery 03/05/2017  . Hypothyroidism, postop 03/14/2016  . Benign essential HTN 05/30/2015  . Carpal tunnel syndrome 05/30/2015  . Dyslipidemia 05/30/2015  . Diabetes mellitus type 2 with carpal tunnel syndrome ([place]) 05/30/2015  . History of thyroidectomy, total 05/30/2015  . Microalbuminuria 05/30/2015  . Morbid obesity ([place]) 05/30/2015    Past Surgical History:  Procedure Laterality Date  . ABDOMINAL HYSTERECTOMY    . 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 BREAST EXCISIONAL BIOPSY Right    benign  . DORSAL COMPARTMENT RELEASE Left 01/18/2017   Procedure: RELEASE DORSAL COMPARTMENT (DEQUERVAIN);  Surgeon: Dereck Leep, MD;  Location: [facility] ORS;  Service: Orthopedics;  Laterality: Left;  . FINE NEEDLE ASPIRATION  10/01/2013   Thyroid-Dr. Gabriel Carina  . THYROIDECTOMY N/A  12/09/2015   Procedure: THYROIDECTOMY;  Surgeon: Clyde Canterbury, MD;  Location: [facility] ORS;  Service: ENT;  Laterality: N/A;    Family History  Problem Relation Age of Onset  . Diabetes Mother   . Diabetes Father   . Hypertension Father   . Hyperlipidemia Father   . Cancer Father   . Diabetes Sister   . Breast cancer Neg Hx      Current Outpatient Medications:  .  acetaminophen (TYLENOL) 325 MG tablet, Take 650 mg by mouth every 6  (six) hours as needed., Disp: , Rfl:  .  aspirin EC 81 MG tablet, Take 81 mg by mouth every evening. , Disp: , Rfl:  .  atorvastatin (LIPITOR) 40 MG tablet, Take 1 tablet (40 mg total) by mouth daily., Disp: 90 tablet, Rfl: 0 .  Calcium-Phosphorus-Vitamin D (CALCIUM GUMMIES PO), Take 2 tablets by mouth daily., Disp: , Rfl:  .  levothyroxine (SYNTHROID) 100 MCG tablet, Take 1 tablet (100 mcg total) by mouth daily. And only half on Sundays, Disp: 90 tablet, Rfl: 1 .  metFORMIN (GLUCOPHAGE) 500 MG tablet, Take 1 tablet (500 mg total) by mouth daily with breakfast., Disp: 90 tablet, Rfl: 0 .  Multiple Vitamin (MULTIVITAMIN WITH MINERALS) TABS tablet, Take 1 tablet by mouth daily. ALIVE MULTIVITAMIN, Disp: , Rfl:  .  olmesartan-hydrochlorothiazide (BENICAR HCT) 40-12.5 MG tablet, Take 1 tablet by mouth daily., Disp: 90 tablet, Rfl: 0 .  TRULICITY 1.5 0000000 SOPN, INJECT 1.5MG  INTO THE SKIN ONCE A WEEK ON SUNDAYS., Disp: 12 mL, Rfl: 0 .  vitamin B-12 (CYANOCOBALAMIN) 100 MCG tablet, Take 100 mcg by mouth daily., Disp: , Rfl:   No Known Allergies  I personally reviewed active problem list, medication list, allergies, family history, social history, health maintenance with the patient/caregiver today.   ROS  Constitutional: Negative for fever or weight change.  Respiratory: Negative for cough and shortness of breath.   Cardiovascular: Negative for chest pain or palpitations.  Gastrointestinal: Negative for abdominal pain, no bowel changes.  Musculoskeletal: Negative for gait problem or joint swelling.  Skin: Negative for rash.  Neurological: Negative for dizziness or headache.  No other specific complaints in a complete review of systems (except as listed in HPI above).  Objective  Vitals:   01/20/20 0953  BP: 108/74  Pulse: 74  Resp: 18  Temp: (!) 96.6 F (35.9 C)  TempSrc: Temporal  SpO2: 99%  Weight: 244 lb 11.2 oz (111 kg)  Height: 5\' 8"  (1.727 m)    Body mass index is 37.21  kg/m.  Physical Exam  Constitutional: Patient appears well-developed and well-nourished. Obese  No distress.  HEENT: head atraumatic, normocephalic, pupils equal and reactive to light, Cardiovascular: Normal rate, regular rhythm and normal heart sounds.  No murmur heard. No BLE edema. Pulmonary/Chest: Effort normal and breath sounds normal. No respiratory distress. Abdominal: Soft.  There is no tenderness. Psychiatric: Patient has a normal mood and affect. behavior is normal. Judgment and thought content normal. Muscular Skeletal: amputated right index finger  Recent Results (from the past 2160 hour(s))  POCT HgB A1C     Status: Normal   Collection Time: 01/20/20  9:57 AM  Result Value Ref Range   Hemoglobin A1C     HbA1c POC (<> result, manual entry)     HbA1c, POC (prediabetic range)     HbA1c, POC (controlled diabetic range) 6.8 0.0 - 7.0 %     PHQ2/9: Depression screen [facility] 2/9 01/20/2020 09/16/2019 06/12/2019 10/08/2018 07/02/2018  Decreased Interest 2 0 0 0 0  Down, Depressed, Hopeless 0 0 0 0 0  PHQ - 2 Score 2 0 0 0 0  Altered sleeping 0 0 0 0 -  Tired, decreased energy 2 0 0 0 -  Change in appetite 0 0 0 0 -  Feeling bad or failure about yourself  0 0 0 0 -  Trouble concentrating 0 0 0 0 -  Moving slowly or fidgety/restless 0 0 0 0 -  Suicidal thoughts 0 0 0 0 -  PHQ-9 Score 4 0 0 0 -  Difficult doing work/chores Not difficult at all Not difficult at all - Not difficult at all -    phq 9 is positive   Fall Risk: Fall Risk  01/20/2020 09/16/2019 06/12/2019 02/08/2019 10/08/2018  Falls in the past year? 0 0 0 0 No  Number falls in past yr: 0 0 0 0 -  Injury with Fall? 0 0 0 0 -    Functional Status Survey: Is the patient deaf or have difficulty hearing?: No Does the patient have difficulty seeing, even when wearing glasses/contacts?: Yes Does the patient have difficulty concentrating, remembering, or making decisions?: No Does the patient have difficulty walking or  climbing stairs?: No Does the patient have difficulty dressing or bathing?: No Does the patient have difficulty doing errands alone such as visiting a doctor's office or shopping?: No    Assessment & Plan  1. Diabetes mellitus type 2 in obese (HCC)  - POCT HgB A1C - Dulaglutide (TRULICITY) 1.5 0000000 SOPN; Inject 1.5 mg into the skin once a week.  Dispense: 12 mL; Refill: 1 - olmesartan-hydrochlorothiazide (BENICAR HCT) 40-12.5 MG tablet; Take 1 tablet by mouth daily.  Dispense: 90 tablet; Refill: 0 - metFORMIN (GLUCOPHAGE) 500 MG tablet; Take 1 tablet (500 mg total) by mouth daily with breakfast.  Dispense: 180 tablet; Refill: 0  2. Dyslipidemia  - atorvastatin (LIPITOR) 40 MG tablet; Take 1 tablet (40 mg total) by mouth daily.  Dispense: 90 tablet; Refill: 1  3. Benign essential HTN  - COMPLETE METABOLIC PANEL WITH GFR - CBC with Differential/Platelet - olmesartan-hydrochlorothiazide (BENICAR HCT) 40-12.5 MG tablet; Take 1 tablet by mouth daily.  Dispense: 90 tablet; Refill: 0  4. Hypothyroidism, postop  Continue levothyroxine  5. Morbid obesity ([place])  Discussed with the patient the risk posed by an increased BMI. Discussed importance of portion control, calorie counting and at least 150 minutes of physical activity weekly. Avoid sweet beverages and drink more water. Eat at least 6 servings of fruit and vegetables daily   6. History of thyroidectomy, total  - levothyroxine (SYNTHROID) 100 MCG tablet; Take 1 tablet (100 mcg total) by mouth daily. And only half on Sundays  Dispense: 90 tablet; Refill: 1  7. Primary osteoarthritis of right knee   8. Amputated right index finger  9. Diabetic nephropathy associated with type 2 diabetes mellitus (HCC)  - Lipid panel  10. Stress at work  Working too many hours and is very tired she will request vacation time

## 2020-01-20 NOTE — Patient Instructions (Signed)
Take half pill Benicar hctz starting three days prior to your next visit

## 2020-03-03 ENCOUNTER — Other Ambulatory Visit: Payer: Self-pay | Admitting: Family Medicine

## 2020-03-03 DIAGNOSIS — E785 Hyperlipidemia, unspecified: Secondary | ICD-10-CM

## 2020-04-20 ENCOUNTER — Other Ambulatory Visit: Payer: Self-pay

## 2020-04-20 ENCOUNTER — Encounter: Payer: Self-pay | Admitting: Family Medicine

## 2020-04-20 ENCOUNTER — Ambulatory Visit (INDEPENDENT_AMBULATORY_CARE_PROVIDER_SITE_OTHER): Payer: BC Managed Care – PPO | Admitting: Family Medicine

## 2020-04-20 VITALS — BP 128/76 | HR 71 | Temp 97.6°F | Resp 16 | Ht 68.0 in | Wt 245.4 lb

## 2020-04-20 DIAGNOSIS — R809 Proteinuria, unspecified: Secondary | ICD-10-CM

## 2020-04-20 DIAGNOSIS — S68119S Complete traumatic metacarpophalangeal amputation of unspecified finger, sequela: Secondary | ICD-10-CM

## 2020-04-20 DIAGNOSIS — E890 Postprocedural hypothyroidism: Secondary | ICD-10-CM | POA: Diagnosis not present

## 2020-04-20 DIAGNOSIS — E785 Hyperlipidemia, unspecified: Secondary | ICD-10-CM | POA: Diagnosis not present

## 2020-04-20 DIAGNOSIS — E669 Obesity, unspecified: Secondary | ICD-10-CM

## 2020-04-20 DIAGNOSIS — E1169 Type 2 diabetes mellitus with other specified complication: Secondary | ICD-10-CM | POA: Diagnosis not present

## 2020-04-20 DIAGNOSIS — M1711 Unilateral primary osteoarthritis, right knee: Secondary | ICD-10-CM

## 2020-04-20 DIAGNOSIS — E1129 Type 2 diabetes mellitus with other diabetic kidney complication: Secondary | ICD-10-CM

## 2020-04-20 DIAGNOSIS — Z23 Encounter for immunization: Secondary | ICD-10-CM | POA: Diagnosis not present

## 2020-04-20 DIAGNOSIS — I10 Essential (primary) hypertension: Secondary | ICD-10-CM

## 2020-04-20 LAB — POCT GLYCOSYLATED HEMOGLOBIN (HGB A1C): Hemoglobin A1C: 6.7 % — AB (ref 4.0–5.6)

## 2020-04-20 MED ORDER — OLMESARTAN MEDOXOMIL-HCTZ 20-12.5 MG PO TABS: 1.0000 | ORAL_TABLET | Freq: Every day | ORAL | 0 refills | Status: DC

## 2020-04-20 MED ORDER — LEVOTHYROXINE SODIUM 100 MCG PO TABS: 100.0000 ug | ORAL_TABLET | Freq: Every day | ORAL | 1 refills | Status: DC

## 2020-04-20 MED ORDER — METFORMIN HCL 500 MG PO TABS: 500.0000 mg | ORAL_TABLET | Freq: Every day | ORAL | 0 refills | Status: DC

## 2020-04-20 NOTE — Progress Notes (Signed)
Name: Samantha Copeland   MRN: [id number]    DOB: 05-20-1961   Date:04/20/2020       Progress Note  Subjective  Chief Complaint  Chief Complaint  Patient presents with  . Diabetes    4 month recheck  . Hyperlipidemia  . Hypothyroidism    comapny changed in medication  . Hypertension    HPI  DMII with nephropathy She is currently on Trulicity and metformin down to once daily otherwise feels symptoms of hypoglycemia, however A1C went from  6.5% to 6.8%, today is 6.7% She denies hypoglycemic episodes,glucose is in the low 100's now ,she states she drinks a lot of water for her health , denies polyphagia, she has nocturia at most once at night when she drinks late at night. Symptoms of carpal tunnel resolved with surgery in 2018, last urine micro was normal ( she is on ARB), last GFR also normal, callus improved with diabetic shoes and insoles. She is following a diabetic diet, eating fish twice a week, exercising, eating healthy.  HTN:bp is towards low end of normal and stable. Compliant with medication . No chest pain or SOB or palpitation, or orthostatic changes.On Benicar HCTZ , since Feb she has been taking half dose of Benicar hctz 40/12.5 , today bp is at goal, we will change rx to 20/12.5.    Hyperlipidemia: she is on Atorvastatin daily.HDL is excellent at 88  LDL is at goal at 46. No myalgiasContinue medications  Obesity: BMI she is on Trulicity, even though still working long hours, she is trying to walk with her daughter around the [place] track at least twice a week, most of the time three times a week for about 90 minutes. She is also eating more fish, fruit and vegetables.  Hypothyroidism:used to seeDr. Richardson Landry had total thyroidectomy in December 2016, doing well, on Synthroid 100 mcg daily andhalf on Sundays. Shehas been released by Dr. Gabriel Carina. No tachycardia,fatigue, dysphagia, or change in bowel movements   OA and right meniscal tear: seen by Valentino Nose  states not longer having pain, discussed importance of avoiding nsaid's, she takes Tylenol very seldom. 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 Unchanged  Patient Active Problem List   Diagnosis Date Noted  . Amputated finger, sequela ([place]) 06/12/2019  . Type 2 diabetes mellitus with pressure callus ([place]) 07/02/2018  . Acute medial meniscus tear 04/06/2018  . Osteoarthritis, knee 04/06/2018  . Diabetic nephropathy associated with type 2 diabetes mellitus ([place]) 03/01/2018  . Trigger thumb of right hand 05/31/2017  . Status post de Quervain's release surgery 03/05/2017  . Hypothyroidism, postop 03/14/2016  . Benign essential HTN 05/30/2015  . Carpal tunnel syndrome 05/30/2015  . Dyslipidemia 05/30/2015  . Diabetes mellitus type 2 with carpal tunnel syndrome ([place]) 05/30/2015  . History of thyroidectomy, total 05/30/2015  . Microalbuminuria 05/30/2015  . Morbid obesity ([place]) 05/30/2015    Past Surgical History:  Procedure Laterality Date  . ABDOMINAL HYSTERECTOMY    . 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 BREAST EXCISIONAL BIOPSY Right    benign  . DORSAL COMPARTMENT RELEASE Left 01/18/2017   Procedure: RELEASE DORSAL COMPARTMENT (DEQUERVAIN);  Surgeon: Dereck Leep, MD;  Location: [facility] ORS;  Service: Orthopedics;  Laterality: Left;  . FINE NEEDLE ASPIRATION  10/01/2013   Thyroid-Dr. Gabriel Carina  . THYROIDECTOMY N/A 12/09/2015   Procedure: THYROIDECTOMY;  Surgeon: Clyde Canterbury,  MD;  Location: [facility] ORS;  Service: ENT;  Laterality: N/A;    Family History  Problem Relation Age of Onset  . Diabetes Mother   . Diabetes Father   . Hypertension Father   . Hyperlipidemia Father   . Cancer Father   . Diabetes Sister   . Breast cancer Neg Hx     Social History   Tobacco Use  . Smoking status: Never Smoker  . Smokeless tobacco: Never Used  Substance Use Topics  .  Alcohol use: Yes    Alcohol/week: 0.0 standard drinks    Comment: occasionally     Current Outpatient Medications:  .  acetaminophen (TYLENOL) 325 MG tablet, Take 650 mg by mouth every 6 (six) hours as needed., Disp: , Rfl:  .  aspirin EC 81 MG tablet, Take 81 mg by mouth every evening. , Disp: , Rfl:  .  atorvastatin (LIPITOR) 40 MG tablet, Take 1 tablet (40 mg total) by mouth daily., Disp: 90 tablet, Rfl: 1 .  Calcium-Phosphorus-Vitamin D (CALCIUM GUMMIES PO), Take 2 tablets by mouth daily., Disp: , Rfl:  .  Dulaglutide (TRULICITY) 1.5 0000000 SOPN, Inject 1.5 mg into the skin once a week., Disp: 12 mL, Rfl: 1 .  levothyroxine (SYNTHROID) 100 MCG tablet, Take 1 tablet (100 mcg total) by mouth daily. And only half on Sundays, Disp: 90 tablet, Rfl: 1 .  metFORMIN (GLUCOPHAGE) 500 MG tablet, Take 1 tablet (500 mg total) by mouth daily with breakfast., Disp: 90 tablet, Rfl: 0 .  Multiple Vitamin (MULTIVITAMIN WITH MINERALS) TABS tablet, Take 1 tablet by mouth daily. ALIVE MULTIVITAMIN, Disp: , Rfl:  .  vitamin B-12 (CYANOCOBALAMIN) 100 MCG tablet, Take 100 mcg by mouth daily., Disp: , Rfl:  .  olmesartan-hydrochlorothiazide (BENICAR HCT) 20-12.5 MG tablet, Take 1 tablet by mouth daily., Disp: 90 tablet, Rfl: 0  No Known Allergies  I personally reviewed active problem list, medication list, allergies, family history, social history, health maintenance with the patient/caregiver today.   ROS  Constitutional: Negative for fever or weight change.  Respiratory: Negative for cough and shortness of breath.   Cardiovascular: Negative for chest pain or palpitations.  Gastrointestinal: Negative for abdominal pain, no bowel changes.  Musculoskeletal: Negative for gait problem or joint swelling.  Skin: Negative for rash.  Neurological: Negative for dizziness or headache.  No other specific complaints in a complete review of systems (except as listed in HPI above).  Objective  Vitals:    04/20/20 0829  BP: 128/76  Pulse: 71  Resp: 16  Temp: 97.6 F (36.4 C)  TempSrc: Temporal  SpO2: 97%  Weight: 245 lb 6.4 oz (111.3 kg)  Height: 5\' 8"  (1.727 m)    Body mass index is 37.31 kg/m.  Physical Exam  Constitutional: Patient appears well-developed and well-nourished. Obese No distress.  HEENT: head atraumatic, normocephalic, pupils equal and reactive to light Cardiovascular: Normal rate, regular rhythm and normal heart sounds.  No murmur heard. No BLE edema. Pulmonary/Chest: Effort normal and breath sounds normal. No respiratory distress. Abdominal: Soft.  There is no tenderness. Psychiatric: Patient has a normal mood and affect. behavior is normal. Judgment and thought content normal.  Recent Results (from the past 2160 hour(s))  POCT HgB A1C     Status: Abnormal   Collection Time: 04/20/20  8:43 AM  Result Value Ref Range   Hemoglobin A1C 6.7 (A) 4.0 - 5.6 %   HbA1c POC (<> result, manual entry)     HbA1c, POC (prediabetic range)  HbA1c, POC (controlled diabetic range)       PHQ2/9: Depression screen [facility] 2/9 04/20/2020 01/20/2020 09/16/2019 06/12/2019 10/08/2018  Decreased Interest 0 2 0 0 0  Down, Depressed, Hopeless 0 0 0 0 0  PHQ - 2 Score 0 2 0 0 0  Altered sleeping 0 0 0 0 0  Tired, decreased energy 0 2 0 0 0  Change in appetite 0 0 0 0 0  Feeling bad or failure about yourself  0 0 0 0 0  Trouble concentrating 0 0 0 0 0  Moving slowly or fidgety/restless 0 0 0 0 0  Suicidal thoughts 0 0 0 0 0  PHQ-9 Score 0 4 0 0 0  Difficult doing work/chores Not difficult at all Not difficult at all Not difficult at all - Not difficult at all  Some recent data might be hidden    phq 9 is negative   Fall Risk: Fall Risk  04/20/2020 01/20/2020 09/16/2019 06/12/2019 02/08/2019  Falls in the past year? 0 0 0 0 0  Number falls in past yr: 0 0 0 0 0  Injury with Fall? 0 0 0 0 0  Follow up Falls evaluation completed - - - -     Functional Status Survey: Is the patient  deaf or have difficulty hearing?: No Does the patient have difficulty seeing, even when wearing glasses/contacts?: No Does the patient have difficulty concentrating, remembering, or making decisions?: No Does the patient have difficulty walking or climbing stairs?: No Does the patient have difficulty dressing or bathing?: No Does the patient have difficulty doing errands alone such as visiting a doctor's office or shopping?: No    Assessment & Plan   1. Diabetes mellitus type 2 in obese (HCC)  - POCT HgB A1C - metFORMIN (GLUCOPHAGE) 500 MG tablet; Take 1 tablet (500 mg total) by mouth daily with breakfast.  Dispense: 90 tablet; Refill: 0  2. Hypothyroidism, postop  - levothyroxine (SYNTHROID) 100 MCG tablet; Take 1 tablet (100 mcg total) by mouth daily. And only half on Sundays  Dispense: 90 tablet; Refill: 1  3. Dyslipidemia  Continue statin therapy   4. Morbid obesity ([place])  Discussed with the patient the risk posed by an increased BMI. Discussed importance of portion control, calorie counting and at least 150 minutes of physical activity weekly. Avoid sweet beverages and drink more water. Eat at least 6 servings of fruit and vegetables daily   5. Benign essential HTN  Doing well on half dose of Benicar hctz 40/12.5 mg we will change to Benicar hctz 20/12.5 mg  - olmesartan-hydrochlorothiazide (BENICAR HCT) 20-12.5 MG tablet; Take 1 tablet by mouth daily.  Dispense: 90 tablet; Refill: 0  6. History of thyroidectomy, total  - levothyroxine (SYNTHROID) 100 MCG tablet; Take 1 tablet (100 mcg total) by mouth daily. And only half on Sundays  Dispense: 90 tablet; Refill: 1  7. Primary osteoarthritis of right knee  Doing well   8. Amputated finger, sequela (HCC)  stable  9. Type 2 diabetes mellitus with microalbuminuria, without long-term current use of insulin (HCC)   10. Need for Tdap vaccination  - Tdap vaccine greater than or equal to 7yo IM

## 2020-04-20 NOTE — Patient Instructions (Signed)
Check with insurance shingrix coverage - vaccine

## 2020-04-30 LAB — HM DIABETES EYE EXAM

## 2020-07-23 ENCOUNTER — Other Ambulatory Visit: Payer: Self-pay | Admitting: Family Medicine

## 2020-07-23 DIAGNOSIS — Z1231 Encounter for screening mammogram for malignant neoplasm of breast: Secondary | ICD-10-CM

## 2020-08-12 ENCOUNTER — Ambulatory Visit
Admission: RE | Admit: 2020-08-12 | Discharge: 2020-08-12 | Disposition: A | Discharge status: Home / Self Care | Payer: BC Managed Care – PPO | Source: Ambulatory Visit | Attending: Family Medicine | Admitting: Family Medicine

## 2020-08-12 ENCOUNTER — Other Ambulatory Visit: Payer: Self-pay

## 2020-08-12 DIAGNOSIS — Z1231 Encounter for screening mammogram for malignant neoplasm of breast: Secondary | ICD-10-CM | POA: Insufficient documentation

## 2020-08-24 ENCOUNTER — Ambulatory Visit (INDEPENDENT_AMBULATORY_CARE_PROVIDER_SITE_OTHER): Payer: BC Managed Care – PPO | Admitting: Family Medicine

## 2020-08-24 ENCOUNTER — Encounter: Payer: Self-pay | Admitting: Family Medicine

## 2020-08-24 ENCOUNTER — Other Ambulatory Visit: Payer: Self-pay

## 2020-08-24 VITALS — BP 122/72 | HR 64 | Temp 98.2°F | Resp 16 | Ht 68.0 in | Wt 241.4 lb

## 2020-08-24 DIAGNOSIS — E890 Postprocedural hypothyroidism: Secondary | ICD-10-CM | POA: Diagnosis not present

## 2020-08-24 DIAGNOSIS — G4726 Circadian rhythm sleep disorder, shift work type: Secondary | ICD-10-CM

## 2020-08-24 DIAGNOSIS — E785 Hyperlipidemia, unspecified: Secondary | ICD-10-CM

## 2020-08-24 DIAGNOSIS — I10 Essential (primary) hypertension: Secondary | ICD-10-CM

## 2020-08-24 DIAGNOSIS — R809 Proteinuria, unspecified: Secondary | ICD-10-CM

## 2020-08-24 DIAGNOSIS — E669 Obesity, unspecified: Secondary | ICD-10-CM | POA: Diagnosis not present

## 2020-08-24 DIAGNOSIS — E1121 Type 2 diabetes mellitus with diabetic nephropathy: Secondary | ICD-10-CM | POA: Diagnosis not present

## 2020-08-24 DIAGNOSIS — S68119S Complete traumatic metacarpophalangeal amputation of unspecified finger, sequela: Secondary | ICD-10-CM

## 2020-08-24 DIAGNOSIS — Z23 Encounter for immunization: Secondary | ICD-10-CM

## 2020-08-24 DIAGNOSIS — E1169 Type 2 diabetes mellitus with other specified complication: Secondary | ICD-10-CM

## 2020-08-24 DIAGNOSIS — M1711 Unilateral primary osteoarthritis, right knee: Secondary | ICD-10-CM

## 2020-08-24 DIAGNOSIS — Z114 Encounter for screening for human immunodeficiency virus [HIV]: Secondary | ICD-10-CM

## 2020-08-24 DIAGNOSIS — E1129 Type 2 diabetes mellitus with other diabetic kidney complication: Secondary | ICD-10-CM

## 2020-08-24 LAB — POCT GLYCOSYLATED HEMOGLOBIN (HGB A1C): Hemoglobin A1C: 6.8 % — AB (ref 4.0–5.6)

## 2020-08-24 MED ORDER — METFORMIN HCL 500 MG PO TABS: 500.0000 mg | ORAL_TABLET | Freq: Two times a day (BID) | ORAL | 1 refills | Status: DC

## 2020-08-24 MED ORDER — OLMESARTAN MEDOXOMIL-HCTZ 20-12.5 MG PO TABS: 1.0000 | ORAL_TABLET | Freq: Every day | ORAL | 1 refills | Status: DC

## 2020-08-24 MED ORDER — TRULICITY 1.5 MG/0.5ML ~~LOC~~ SOAJ: 1.5000 mg | SUBCUTANEOUS | 1 refills | Status: DC

## 2020-08-24 MED ORDER — ATORVASTATIN CALCIUM 40 MG PO TABS: 40.0000 mg | ORAL_TABLET | Freq: Every day | ORAL | 1 refills | Status: DC

## 2020-08-24 MED ORDER — MODAFINIL 100 MG PO TABS: 100.0000 mg | ORAL_TABLET | Freq: Every day | ORAL | 2 refills | Status: DC

## 2020-08-24 NOTE — Progress Notes (Signed)
Name: Samantha Copeland   MRN: [id number]    DOB: 1961-02-09   Date:08/24/2020       Progress Note  Subjective  Chief Complaint  Follow up/Medication refill  HPI  DMII with nephropathy She is currently on Trulicity and metformin down to once daily otherwise feels symptoms of hypoglycemia, however A1C went from  6.5% to 6.8%,  6.7% and today is 6.8 % She denies hypoglycemic episodes,glucoseis in the low 100's now,she states she drinks a lot of water for her health , denies polyphagia, she has nocturia at most once at night when she drinks late at night. Symptoms of carpal tunnel resolved with surgery in 2018, last urine micro was normal ( she is on ARB), last GFR also normal, callus improved with diabetic shoes and insoles. She is following a diabetic diet, eating fish twice a week, exercising, eating healthy. She was disappointed when she saw her A1C went up. She has tried Go-lo drink for energy and is not sure if there is sugar in that. She has been trying to get glucose lower . She is wiling to go up on dose of Metformin, does not want the sustained release formulation   HTN:bp is towards low end of normaland stable.Compliant with medication . No chest pain or SOB or palpitation, or orthostatic changes.On Benicar HCTZ and on lower dose since Feb 2021 , bp is still at goal, continue current dose of 20/12.5 mg   Hyperlipidemia: she is on Atorvastatin daily.HDL is excellent at 88  LDL is at goal at 46. No myalgiasWe will recheck labs today   Obesity:BMIshe is on Trulicity, even though still working long hours, she is trying to walk with her [place] around the [place] at least twice a week, most of the time three times a week for about 90 minutes. She is also eating more fish, fruit and vegetables. She has also been taking care of mothers yard and painting. Her weight is down by a few pounds today   Hypothyroidism:used to seeDr. Richardson Landry had total thyroidectomy in  December 2016, doing well, on Synthroid 100 mcg daily andhalf on Sundays. Shehas been released by Dr. Gabriel Carina. No tachycardia,fatigue, dysphagia, or change in bowel movements Last TSH was about one year ago and we will recheck level   OA and right meniscal tear: seen by Ortho, discussed importance of avoiding nsaid's, she takes Tylenol very seldom. 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 , she is feeling more tired than usual, ESS negative and no indication for sleep study, we will try Provigil , advised to stop taking supplementation for energy   Patient Active Problem List   Diagnosis Date Noted   Amputated finger, sequela ([place]) 06/12/2019   Type 2 diabetes mellitus with pressure callus ([place]) 07/02/2018   Acute medial meniscus tear 04/06/2018   Osteoarthritis, knee 04/06/2018   Diabetic nephropathy associated with type 2 diabetes mellitus ([place]) 03/01/2018   Trigger thumb of right hand 05/31/2017   Status post de Quervain's release surgery 03/05/2017   Hypothyroidism, postop 03/14/2016   Benign essential HTN 05/30/2015   Carpal tunnel syndrome 05/30/2015   Dyslipidemia 05/30/2015   Diabetes mellitus type 2 with carpal tunnel syndrome ([place]) 05/30/2015   History of thyroidectomy, total 05/30/2015   Microalbuminuria 05/30/2015  Morbid obesity ([place]) 05/30/2015    Past Surgical History:  Procedure Laterality Date   ABDOMINAL HYSTERECTOMY     AMPUTATION FINGER Right 1963   Index Finger after injury as a infant   BREAST EXCISIONAL BIOPSY Right    benign   DORSAL COMPARTMENT RELEASE Left 01/18/2017   Procedure: RELEASE DORSAL COMPARTMENT (DEQUERVAIN);  Surgeon: Dereck Leep, MD;  Location: [facility] ORS;  Service: Orthopedics;  Laterality: Left;   FINE NEEDLE  ASPIRATION  10/01/2013   Thyroid-Dr. Gabriel Carina   THYROIDECTOMY N/A 12/09/2015   Procedure: THYROIDECTOMY;  Surgeon: Clyde Canterbury, MD;  Location: [facility] ORS;  Service: ENT;  Laterality: N/A;    Family History  Problem Relation Age of Onset   Diabetes Mother    Diabetes Father    Hypertension Father    Hyperlipidemia Father    Cancer Father    Diabetes Sister    Breast cancer Neg Hx     Social History   Tobacco Use   Smoking status: Never Smoker   Smokeless tobacco: Never Used  Substance Use Topics   Alcohol use: Yes    Alcohol/week: 0.0 standard drinks    Comment: occasionally     Current Outpatient Medications:    acetaminophen (TYLENOL) 325 MG tablet, Take 650 mg by mouth every 6 (six) hours as needed., Disp: , Rfl:    aspirin EC 81 MG tablet, Take 81 mg by mouth every evening. , Disp: , Rfl:    atorvastatin (LIPITOR) 40 MG tablet, Take 1 tablet (40 mg total) by mouth daily., Disp: 90 tablet, Rfl: 1   Calcium-Phosphorus-Vitamin D (CALCIUM GUMMIES PO), Take 2 tablets by mouth daily., Disp: , Rfl:    Dulaglutide (TRULICITY) 1.5 ZO/1.0RU SOPN, Inject 1.5 mg into the skin once a week., Disp: 12 mL, Rfl: 1   levothyroxine (SYNTHROID) 100 MCG tablet, Take 1 tablet (100 mcg total) by mouth daily. And only half on Sundays, Disp: 90 tablet, Rfl: 1   metFORMIN (GLUCOPHAGE) 500 MG tablet, Take 1 tablet (500 mg total) by mouth 2 (two) times daily with a meal., Disp: 180 tablet, Rfl: 1   Multiple Vitamin (MULTIVITAMIN WITH MINERALS) TABS tablet, Take 1 tablet by mouth daily. ALIVE MULTIVITAMIN, Disp: , Rfl:    olmesartan-hydrochlorothiazide (BENICAR HCT) 20-12.5 MG tablet, Take 1 tablet by mouth daily., Disp: 90 tablet, Rfl: 1   vitamin B-12 (CYANOCOBALAMIN) 100 MCG tablet, Take 100 mcg by mouth daily., Disp: , Rfl:    modafinil (PROVIGIL) 100 MG tablet, Take 1 tablet (100 mg total) by mouth daily. Before going to work, Disp: 30 tablet, Rfl: 2  No Known Allergies  I  personally reviewed active problem list, medication list, allergies, family history, social history, health maintenance with the patient/caregiver today.   ROS  Constitutional: Negative for fever or weight change.  Respiratory: Negative for cough and shortness of breath.   Cardiovascular: Negative for chest pain or palpitations.  Gastrointestinal: Negative for abdominal pain, no bowel changes.  Musculoskeletal: Negative for gait problem or joint swelling.  Skin: Negative for rash.  Neurological: Negative for dizziness or headache.  No other specific complaints in a complete review of systems (except as listed in HPI above).  Objective  Vitals:   08/24/20 0914  BP: 122/72  Pulse: 64  Resp: 16  Temp: 98.2 F (36.8 C)  TempSrc: Oral  SpO2: 100%  Weight: 241 lb 6.4 oz (109.5 kg)  Height: 5\' 8"  (1.727 m)    Body mass index is 36.7 kg/m.  Physical  Exam  Constitutional: Patient appears well-developed and well-nourished. Obese No distress.  HEENT: head atraumatic, normocephalic, pupils equal and reactive to light,  neck supple Cardiovascular: Normal rate, regular rhythm and normal heart sounds.  No murmur heard. No BLE edema. Pulmonary/Chest: Effort normal and breath sounds normal. No respiratory distress. Abdominal: Soft.  There is no tenderness. Muscular skeletal: amputated right index finger  Psychiatric: Patient has a normal mood and affect. behavior is normal. Judgment and thought content normal.  Recent Results (from the past 2160 hour(s))  POCT HgB A1C     Status: Abnormal   Collection Time: 08/24/20  9:18 AM  Result Value Ref Range   Hemoglobin A1C 6.8 (A) 4.0 - 5.6 %   HbA1c POC (<> result, manual entry)     HbA1c, POC (prediabetic range)     HbA1c, POC (controlled diabetic range)      Diabetic Foot Exam: Diabetic Foot Exam - Simple   Simple Foot Form Diabetic Foot exam was performed with the following findings: Yes 08/24/2020  9:48 AM  Visual Inspection No  deformities, no ulcerations, no other skin breakdown bilaterally: Yes Sensation Testing Intact to touch and monofilament testing bilaterally: Yes Pulse Check Posterior Tibialis and Dorsalis pulse intact bilaterally: Yes Comments     PHQ2/9: Depression screen [facility] 2/9 08/24/2020 04/20/2020 01/20/2020 09/16/2019 06/12/2019  Decreased Interest 0 0 2 0 0  Down, Depressed, Hopeless 0 0 0 0 0  PHQ - 2 Score 0 0 2 0 0  Altered sleeping - 0 0 0 0  Tired, decreased energy - 0 2 0 0  Change in appetite - 0 0 0 0  Feeling bad or failure about yourself  - 0 0 0 0  Trouble concentrating - 0 0 0 0  Moving slowly or fidgety/restless - 0 0 0 0  Suicidal thoughts - 0 0 0 0  PHQ-9 Score - 0 4 0 0  Difficult doing work/chores - Not difficult at all Not difficult at all Not difficult at all -  Some recent data might be hidden    phq 9 is negative   Fall Risk: Fall Risk  08/24/2020 04/20/2020 01/20/2020 09/16/2019 06/12/2019  Falls in the past year? 0 0 0 0 0  Number falls in past yr: 0 0 0 0 0  Injury with Fall? 0 0 0 0 0  Follow up - Falls evaluation completed - - -     Functional Status Survey: Is the patient deaf or have difficulty hearing?: No Does the patient have difficulty seeing, even when wearing glasses/contacts?: No Does the patient have difficulty concentrating, remembering, or making decisions?: No Does the patient have difficulty walking or climbing stairs?: No Does the patient have difficulty dressing or bathing?: No Does the patient have difficulty doing errands alone such as visiting a doctor's office or shopping?: No   Assessment & Plan  1. Diabetes mellitus type 2 in obese (HCC)  - POCT HgB A1C - Dulaglutide (TRULICITY) 1.5 ZM/6.2HU SOPN; Inject 1.5 mg into the skin once a week.  Dispense: 12 mL; Refill: 1 - metFORMIN (GLUCOPHAGE) 500 MG tablet; Take 1 tablet (500 mg total) by mouth 2 (two) times daily with a meal.  Dispense: 180 tablet; Refill: 1  2. Need for immunization  against influenza  - Flu Vaccine QUAD 36+ mos IM  3. Diabetic nephropathy associated with type 2 diabetes mellitus (HCC)  - Microalbumin / creatinine urine ratio  4. Hypothyroidism, postop  - TSH   5. Dyslipidemia  -  Lipid panel - atorvastatin (LIPITOR) 40 MG tablet; Take 1 tablet (40 mg total) by mouth daily.  Dispense: 90 tablet; Refill: 1  6. History of thyroidectomy, total   7. Benign essential HTN  - COMPLETE METABOLIC PANEL WITH GFR - CBC with Differential/Platelet - olmesartan-hydrochlorothiazide (BENICAR HCT) 20-12.5 MG tablet; Take 1 tablet by mouth daily.  Dispense: 90 tablet; Refill: 1  8. Morbid obesity ([place])  Discussed with the patient the risk posed by an increased BMI. Discussed importance of portion control, calorie counting and at least 150 minutes of physical activity weekly. Avoid sweet beverages and drink more water. Eat at least 6 servings of fruit and vegetables daily   9. Primary osteoarthritis of right knee   10. Amputated finger, sequela ([place])   11. Type 2 diabetes mellitus with microalbuminuria, without long-term current use of insulin ([place])   12. Shift work sleep disorder  - modafinil (PROVIGIL) 100 MG tablet; Take 1 tablet (100 mg total) by mouth daily. Before going to work  Dispense: 30 tablet; Refill: 2  13. Need for shingles vaccine  - Varicella-zoster vaccine IM  14. Encounter for screening for HIV  - HIV Antibody (routine testing w rflx)

## 2020-08-25 LAB — CBC WITH DIFFERENTIAL/PLATELET
Absolute Monocytes: 606 cells/uL (ref 200–950)
Basophils Absolute: 42 cells/uL (ref 0–200)
Basophils Relative: 0.5 %
Eosinophils Absolute: 141 cells/uL (ref 15–500)
Eosinophils Relative: 1.7 %
HCT: 42.9 % (ref 35.0–45.0)
Hemoglobin: 14.2 g/dL (ref 11.7–15.5)
Lymphs Abs: 3967 cells/uL — ABNORMAL HIGH (ref 850–3900)
MCH: 28.3 pg (ref 27.0–33.0)
MCHC: 33.1 g/dL (ref 32.0–36.0)
MCV: 85.6 fL (ref 80.0–100.0)
MPV: 11 fL (ref 7.5–12.5)
Monocytes Relative: 7.3 %
Neutro Abs: 3544 cells/uL (ref 1500–7800)
Neutrophils Relative %: 42.7 %
Platelets: 235 10*3/uL (ref 140–400)
RBC: 5.01 10*6/uL (ref 3.80–5.10)
RDW: 14.9 % (ref 11.0–15.0)
Total Lymphocyte: 47.8 %
WBC: 8.3 10*3/uL (ref 3.8–10.8)

## 2020-08-25 LAB — COMPLETE METABOLIC PANEL WITH GFR
AG Ratio: 1.3 (calc) (ref 1.0–2.5)
ALT: 16 U/L (ref 6–29)
AST: 18 U/L (ref 10–35)
Albumin: 4.1 g/dL (ref 3.6–5.1)
Alkaline phosphatase (APISO): 100 U/L (ref 37–153)
BUN: 12 mg/dL (ref 7–25)
CO2: 29 mmol/L (ref 20–32)
Calcium: 9.3 mg/dL (ref 8.6–10.4)
Chloride: 101 mmol/L (ref 98–110)
Creat: 1.04 mg/dL (ref 0.50–1.05)
GFR, Est African American: 69 mL/min/{1.73_m2} (ref >=60)
GFR, Est Non African American: 59 mL/min/{1.73_m2} — ABNORMAL LOW (ref >=60)
Globulin: 3.1 g/dL (calc) (ref 1.9–3.7)
Glucose, Bld: 88 mg/dL (ref 65–99)
Potassium: 3.6 mmol/L (ref 3.5–5.3)
Sodium: 142 mmol/L (ref 135–146)
Total Bilirubin: 0.6 mg/dL (ref 0.2–1.2)
Total Protein: 7.2 g/dL (ref 6.1–8.1)

## 2020-08-25 LAB — LIPID PANEL
Cholesterol: 140 mg/dL (ref <200)
HDL: 88 mg/dL (ref >=50)
LDL Cholesterol (Calc): 40 mg/dL (calc)
Non-HDL Cholesterol (Calc): 52 mg/dL (calc) (ref <130)
Total CHOL/HDL Ratio: 1.6 (calc) (ref <5.0)
Triglycerides: 50 mg/dL (ref <150)

## 2020-08-25 LAB — TSH: TSH: 3.71 mIU/L (ref 0.40–4.50)

## 2020-08-25 LAB — MICROALBUMIN / CREATININE URINE RATIO
Creatinine, Urine: 93 mg/dL (ref 20–275)
Microalb Creat Ratio: 6 mcg/mg creat (ref <30)
Microalb, Ur: 0.6 mg/dL

## 2020-08-25 LAB — HIV ANTIBODY (ROUTINE TESTING W REFLEX): HIV 1&2 Ab, 4th Generation: NONREACTIVE

## 2020-08-26 ENCOUNTER — Other Ambulatory Visit: Payer: Self-pay | Admitting: Family Medicine

## 2020-08-26 DIAGNOSIS — E1169 Type 2 diabetes mellitus with other specified complication: Secondary | ICD-10-CM

## 2020-08-26 DIAGNOSIS — E785 Hyperlipidemia, unspecified: Secondary | ICD-10-CM

## 2020-10-24 ENCOUNTER — Other Ambulatory Visit: Payer: Self-pay | Admitting: Family Medicine

## 2020-10-24 DIAGNOSIS — I10 Essential (primary) hypertension: Secondary | ICD-10-CM

## 2020-11-19 ENCOUNTER — Other Ambulatory Visit: Payer: Self-pay | Admitting: Family Medicine

## 2020-11-19 DIAGNOSIS — E890 Postprocedural hypothyroidism: Secondary | ICD-10-CM

## 2020-11-25 NOTE — Progress Notes (Signed)
Name: Samantha Copeland   MRN: [id number]    DOB: 05-31-1961   Date:11/26/2020       Progress Note  Subjective  Chief Complaint  Follow up   HPI   DMII with nephropathy She is currently on Trulicity and metformin twice daily  6.5% to 6.8%,  6.7% , 6.8 % today is down to 6.4 % She denies recent episode of hypoglycemia. Glucose at home has been between 70-245 , most of time is around high 90's  Symptoms of carpal tunnel resolved with surgery in 2018, last urine micro was normal ( she is on ARB), last GFR also normal, callus improved with diabetic shoes and insoles. She is following a diabetic diet.She is doing well, continue current medication and increase physical activity.  HTN:bp is towards low end of normaland stable.Compliant with medication . No chest pain or SOB or palpitation, or orthostatic changes.On Benicar HCTZ 20/12.5 mg we will continue current dose   Hyperlipidemia: she is on Atorvastatin daily.LDL down to 40.  No myalgias  Obesity:BMIshe is on Trulicity, even though still working long hours, watching her almost 2 year old granddaughter and has been unable to walk with her daughter  Hypothyroidism:used to seeDr. Richardson Landry had total thyroidectomy in December 2016, doing well, on Synthroid 100 mcg daily andhalf on Sundays. Shehas been released by Dr. Gabriel Carina. Last TSH at goal  OA and right meniscal tear: seen by Ortho, discussed importance of avoiding nsaid's, she takes Tylenol very seldom. 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 Amputated finger, sequela ([place]) 06/12/2019  . Type 2 diabetes mellitus  with pressure callus ([place]) 07/02/2018  . Acute medial meniscus tear 04/06/2018  . Osteoarthritis, knee 04/06/2018  . Diabetic nephropathy associated with type 2 diabetes mellitus ([place]) 03/01/2018  . Trigger thumb of right hand 05/31/2017  . Status post de Quervain's release surgery 03/05/2017  . Hypothyroidism, postop 03/14/2016  . Benign essential HTN 05/30/2015  . Carpal tunnel syndrome 05/30/2015  . Dyslipidemia 05/30/2015  . Diabetes mellitus type 2 with carpal tunnel syndrome ([place]) 05/30/2015  . History of thyroidectomy, total 05/30/2015  . Microalbuminuria 05/30/2015  . Morbid obesity ([place]) 05/30/2015    Past Surgical History:  Procedure Laterality Date  . ABDOMINAL HYSTERECTOMY    . 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 BREAST EXCISIONAL BIOPSY Right    benign  . DORSAL COMPARTMENT RELEASE Left 01/18/2017   Procedure: RELEASE DORSAL COMPARTMENT (DEQUERVAIN);  Surgeon: Dereck Leep, MD;  Location: [facility] ORS;  Service: Orthopedics;  Laterality: Left;  . FINE NEEDLE ASPIRATION  10/01/2013   Thyroid-Dr. Gabriel Carina  . THYROIDECTOMY N/A 12/09/2015   Procedure: THYROIDECTOMY;  Surgeon: Clyde Canterbury, MD;  Location: [facility] ORS;  Service: ENT;  Laterality: N/A;    Family History  Problem Relation Age of Onset  . Diabetes Mother   . Diabetes Father   . Hypertension Father   . Hyperlipidemia Father   . Cancer Father   . Diabetes Sister   . Breast cancer Neg Hx     Social History  Tobacco Use  . Smoking status: Never Smoker  . Smokeless tobacco: Never Used  Substance Use Topics  . Alcohol use: Yes    Alcohol/week: 0.0 standard drinks    Comment: occasionally     Current Outpatient Medications:  .  acetaminophen (TYLENOL) 325 MG tablet, Take 650 mg by mouth every 6 (six) hours as needed., Disp: , Rfl:  .  aspirin EC 81 MG tablet, Take 81 mg by mouth every evening. , Disp: , Rfl:  .  atorvastatin (LIPITOR) 40 MG tablet, Take 1 tablet (40 mg  total) by mouth daily., Disp: 90 tablet, Rfl: 1 .  Calcium-Phosphorus-Vitamin D (CALCIUM GUMMIES PO), Take 2 tablets by mouth daily., Disp: , Rfl:  .  Dulaglutide (TRULICITY) 1.5 NI/6.2VO SOPN, Inject 1.5 mg into the skin once a week., Disp: 12 mL, Rfl: 1 .  levothyroxine (SYNTHROID) 100 MCG tablet, TAKE 1 TABLET BY MOUTH ONCE DAILY AND ONLY ONE-HALF TABLET ON SUNDAYS, Disp: 90 tablet, Rfl: 0 .  metFORMIN (GLUCOPHAGE) 500 MG tablet, Take 1 tablet (500 mg total) by mouth 2 (two) times daily with a meal., Disp: 180 tablet, Rfl: 1 .  modafinil (PROVIGIL) 100 MG tablet, Take 1 tablet (100 mg total) by mouth daily. Before going to work, Disp: 30 tablet, Rfl: 2 .  Multiple Vitamin (MULTIVITAMIN WITH MINERALS) TABS tablet, Take 1 tablet by mouth daily. ALIVE MULTIVITAMIN, Disp: , Rfl:  .  olmesartan-hydrochlorothiazide (BENICAR HCT) 20-12.5 MG tablet, Take 1 tablet by mouth daily., Disp: 90 tablet, Rfl: 1 .  vitamin B-12 (CYANOCOBALAMIN) 100 MCG tablet, Take 100 mcg by mouth daily., Disp: , Rfl:   No Known Allergies  I personally reviewed active problem list, medication list, allergies, family history, social history, health maintenance, notes from last encounter with the patient/caregiver today.   ROS  Constitutional: Negative for fever or weight change.  Respiratory: Negative for cough and shortness of breath.   Cardiovascular: Negative for chest pain or palpitations.  Gastrointestinal: Negative for abdominal pain, no bowel changes.  Musculoskeletal: Negative for gait problem or joint swelling.  Skin: Negative for rash.  Neurological: Negative for dizziness or headache.  No other specific complaints in a complete review of systems (except as listed in HPI above).  Objective  Vitals:   11/26/20 1029  BP: 116/76  Pulse: 84  Resp: 16  Temp: 98.1 F (36.7 C)  TempSrc: Oral  SpO2: 98%  Weight: 244 lb 6.4 oz (110.9 kg)  Height: 5\' 8"  (1.727 m)    Body mass index is 37.16  kg/m.  Physical Exam  Constitutional: Patient appears well-developed and well-nourished. Obese No distress.  HEENT: head atraumatic, normocephalic, pupils equal and reactive to light,  neck supple Cardiovascular: Normal rate, regular rhythm and normal heart sounds.  No murmur heard. No BLE edema. Pulmonary/Chest: Effort normal and breath sounds normal. No respiratory distress. Abdominal: Soft.  There is no tenderness. Muscular Skeletal: amputated right index finger Psychiatric: Patient has a normal mood and affect. behavior is normal. Judgment and thought content normal.  Recent Results (from the past 2160 hour(s))  POCT HgB A1C     Status: Abnormal   Collection Time: 11/26/20 10:33 AM  Result Value Ref Range   Hemoglobin A1C 6.4 (A) 4.0 - 5.6 %   HbA1c POC (<> result, manual entry)     HbA1c, POC (prediabetic range)     HbA1c, POC (controlled diabetic range)       PHQ2/9: Depression screen [facility] 2/9 11/26/2020 08/24/2020 04/20/2020 01/20/2020 09/16/2019  Decreased Interest 0 0 0 2 0  Down, Depressed, Hopeless 0 0 0 0 0  PHQ - 2 Score 0 0 0 2 0  Altered sleeping - - 0 0 0  Tired, decreased energy - - 0 2 0  Change in appetite - - 0 0 0  Feeling bad or failure about yourself  - - 0 0 0  Trouble concentrating - - 0 0 0  Moving slowly or fidgety/restless - - 0 0 0  Suicidal thoughts - - 0 0 0  PHQ-9 Score - - 0 4 0  Difficult doing work/chores - - Not difficult at all Not difficult at all Not difficult at all  Some recent data might be hidden    phq 9 is negative   Fall Risk: Fall Risk  11/26/2020 08/24/2020 04/20/2020 01/20/2020 09/16/2019  Falls in the past year? 0 0 0 0 0  Number falls in past yr: 0 0 0 0 0  Injury with Fall? 0 0 0 0 0  Follow up - - Falls evaluation completed - -     Functional Status Survey: Is the patient deaf or have difficulty hearing?: No Does the patient have difficulty seeing, even when wearing glasses/contacts?: No Does the patient have difficulty  concentrating, remembering, or making decisions?: No Does the patient have difficulty walking or climbing stairs?: No Does the patient have difficulty dressing or bathing?: No Does the patient have difficulty doing errands alone such as visiting a doctor's office or shopping?: No    Assessment & Plan  1. Diabetes mellitus type 2 in obese (HCC)  - POCT HgB A1C  2. Need for shingles vaccine  - Varicella-zoster vaccine IM (Shingrix)  3. Shift work sleep disorder   4. Benign essential HTN  At goal   5. Dyslipidemia   6. Morbid obesity ([place])  Discussed with the patient the risk posed by an increased BMI. Discussed importance of portion control, calorie counting and at least 150 minutes of physical activity weekly. Avoid sweet beverages and drink more water. Eat at least 6 servings of fruit and vegetables daily   7. Diabetic nephropathy associated with type 2 diabetes mellitus ([place])   8. Primary osteoarthritis of right knee   9. Amputated finger, sequela ([place])

## 2020-11-26 ENCOUNTER — Other Ambulatory Visit: Payer: Self-pay

## 2020-11-26 ENCOUNTER — Ambulatory Visit (INDEPENDENT_AMBULATORY_CARE_PROVIDER_SITE_OTHER): Payer: BC Managed Care – PPO | Admitting: Family Medicine

## 2020-11-26 ENCOUNTER — Encounter: Payer: Self-pay | Admitting: Family Medicine

## 2020-11-26 VITALS — BP 116/76 | HR 84 | Temp 98.1°F | Resp 16 | Ht 68.0 in | Wt 244.4 lb

## 2020-11-26 DIAGNOSIS — G4726 Circadian rhythm sleep disorder, shift work type: Secondary | ICD-10-CM | POA: Diagnosis not present

## 2020-11-26 DIAGNOSIS — E785 Hyperlipidemia, unspecified: Secondary | ICD-10-CM

## 2020-11-26 DIAGNOSIS — M1711 Unilateral primary osteoarthritis, right knee: Secondary | ICD-10-CM

## 2020-11-26 DIAGNOSIS — E1169 Type 2 diabetes mellitus with other specified complication: Secondary | ICD-10-CM

## 2020-11-26 DIAGNOSIS — Z23 Encounter for immunization: Secondary | ICD-10-CM | POA: Diagnosis not present

## 2020-11-26 DIAGNOSIS — S68119S Complete traumatic metacarpophalangeal amputation of unspecified finger, sequela: Secondary | ICD-10-CM

## 2020-11-26 DIAGNOSIS — E669 Obesity, unspecified: Secondary | ICD-10-CM

## 2020-11-26 DIAGNOSIS — E1121 Type 2 diabetes mellitus with diabetic nephropathy: Secondary | ICD-10-CM

## 2020-11-26 DIAGNOSIS — I10 Essential (primary) hypertension: Secondary | ICD-10-CM

## 2020-11-26 LAB — POCT GLYCOSYLATED HEMOGLOBIN (HGB A1C): Hemoglobin A1C: 6.4 % — AB (ref 4.0–5.6)

## 2021-02-09 ENCOUNTER — Other Ambulatory Visit: Payer: Self-pay | Admitting: Family Medicine

## 2021-02-09 DIAGNOSIS — E1169 Type 2 diabetes mellitus with other specified complication: Secondary | ICD-10-CM

## 2021-02-24 NOTE — Progress Notes (Signed)
Name: Samantha Copeland   MRN: [id number]    DOB: July 02, 1961   Date:02/25/2021       Progress Note  Subjective  Chief Complaint  Follow Up  I connected with  Samantha Copeland  on 02/25/21 at  9:40 AM EDT by telephone and verified that I am speaking with the correct person using two identifiers.  I discussed the limitations of evaluation and management by telemedicine and the availability of in person appointments. The patient expressed understanding and agreed to proceed with the virtual visit  Staff also discussed with the patient that there may be a patient responsible charge related to this service. Patient Location: at home  Provider Location: [facility] Additional Individuals present: alone   HPI  DMII with nephropathy She is currently on Trulicity and metformin twice daily  6.5% to 6.8%,  6.7% , 6.8 % , down to 6.4 % ,today 6.1 %  She denies recent episode of hypoglycemia. Glucose at home has been well controlled around 90 most of the time. Symptoms of carpal tunnel resolved with surgery in 2018, last urine micro was normal ( she is on ARB), last GFR also normal, callus improved with diabetic shoes and insoles. She also has obesity.   HTN:She denies  chest pain or SOB or palpitation, or orthostatic changes.On Benicar HCTZ 20/12.5 mg, no side effects   Hyperlipidemia: she is on Atorvastatin daily.LDL down to 40.  No myalgiasContinue medication   Obesity:BMIshe is on Trulicity, even though still working long hours, she states her clothes is getting loser and thinks she has lost more weight since last visit. She has been walking with her daughter   Hypothyroidism:used to seeDr. Richardson Landry had total thyroidectomy in December 2016, doing well, on Synthroid 100 mcg daily andhalf on Sundays. Shehas been released by Dr. Gabriel Carina. Last TSH at goal, continue current dose   OA and right meniscal tear: seen by Ortho, discussed importance of avoiding nsaid's, she takes Tylenol very  seldom. 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 She also had some chills. She states no personal history of seasonal allergies. She has been taking otc medications and seems to be feeling a little better, she did not get tested for COVID-19. She states went to work last night. She states her employer does not require testing unless patient has a fever   Patient Active Problem List   Diagnosis Date Noted  . Amputated finger, sequela ([place]) 06/12/2019  . Type 2 diabetes mellitus with pressure callus ([place]) 07/02/2018  . Acute medial meniscus tear 04/06/2018  . Osteoarthritis, knee 04/06/2018  . Diabetic nephropathy associated with type 2 diabetes mellitus ([place]) 03/01/2018  . Trigger thumb of right hand 05/31/2017  . Status post de Quervain's release surgery 03/05/2017  . Hypothyroidism, postop 03/14/2016  . Benign essential HTN 05/30/2015  . Carpal tunnel syndrome 05/30/2015  . Dyslipidemia 05/30/2015  . Diabetes mellitus type 2 with carpal tunnel syndrome ([place]) 05/30/2015  . History of thyroidectomy, total 05/30/2015  . Microalbuminuria 05/30/2015  . Morbid obesity ([place])  05/30/2015    Past Surgical History:  Procedure Laterality Date  . ABDOMINAL HYSTERECTOMY    . 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 BREAST EXCISIONAL BIOPSY Right    benign  . DORSAL COMPARTMENT RELEASE Left 01/18/2017   Procedure: RELEASE DORSAL COMPARTMENT (DEQUERVAIN);  Surgeon: Dereck Leep, MD;  Location: [facility] ORS;  Service: Orthopedics;   Laterality: Left;  . FINE NEEDLE ASPIRATION  10/01/2013   Thyroid-Dr. Gabriel Carina  . THYROIDECTOMY N/A 12/09/2015   Procedure: THYROIDECTOMY;  Surgeon: Clyde Canterbury, MD;  Location: [facility] ORS;  Service: ENT;  Laterality: N/A;    Family History  Problem Relation Age of Onset  . Diabetes Mother   . Diabetes Father   . Hypertension Father   . Hyperlipidemia Father   . Cancer Father   . Diabetes Sister   . Breast cancer Neg Hx     Social History   Socioeconomic History  . Marital status: Married    Spouse name: Orpah Greek  . Number of children: 3  . Years of education: Not on file  . Highest education level: 12th grade  Occupational History  . 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 Smoking status: Never Smoker  . Smokeless tobacco: Never Used  Vaping Use  . Vaping Use: Never used  Substance and Sexual Activity  . Alcohol use: Yes    Alcohol/week: 0.0 standard drinks    Comment: occasionally  . Drug use: No  . Sexual activity: Yes    Partners: Male    Birth control/protection: Surgical  Other Topics Concern  . Not on file  Social History Narrative   Lives with husband   Three grown children    One grandson    Social Determinants of Health   Financial Resource Strain: Low Risk   . Difficulty of Paying Living Expenses: Not hard at all  Food Insecurity: No Food Insecurity  . Worried About Charity fundraiser in the Last Year: Never true  . Ran Out of Food in the Last Year: Never true  Transportation Needs: No Transportation Needs  . Lack of Transportation (Medical): No  . Lack of Transportation (Non-Medical): No  Physical Activity: Sufficiently Active  . Days of Exercise per Week: 3 days  . Minutes of Exercise per Session: 90 min  Stress: No Stress Concern Present  . Feeling of Stress : Not at all  Social Connections: Socially Integrated  . Frequency of Communication with Friends and Family: More than three times a week  .  Frequency of Social Gatherings with Friends and Family: More than three times a week  . Attends Religious Services: More than 4 times per year  . Active Member of Clubs or Organizations: Yes  . Attends Archivist Meetings: Never  . Marital Status: Married  Human resources officer Violence: Not At Risk  . Fear of Current or Ex-Partner: No  . Emotionally Abused: No  . Physically Abused: No  . Sexually Abused: No     Current Outpatient Medications:  .  acetaminophen (TYLENOL) 325 MG tablet, Take 650 mg by mouth every 6 (six) hours as needed., Disp: , Rfl:  .  aspirin EC 81 MG tablet, Take 81 mg by mouth every evening. , Disp: , Rfl:  .  Calcium-Phosphorus-Vitamin D (CALCIUM GUMMIES PO), Take 2 tablets by mouth daily., Disp: , Rfl:  .  modafinil (PROVIGIL) 100 MG tablet, Take  1 tablet (100 mg total) by mouth daily. Before going to work, Disp: 30 tablet, Rfl: 2 .  Multiple Vitamin (MULTIVITAMIN WITH MINERALS) TABS tablet, Take 1 tablet by mouth daily. ALIVE MULTIVITAMIN, Disp: , Rfl:  .  vitamin B-12 (CYANOCOBALAMIN) 100 MCG tablet, Take 100 mcg by mouth daily., Disp: , Rfl:  .  atorvastatin (LIPITOR) 40 MG tablet, Take 1 tablet (40 mg total) by mouth daily., Disp: 90 tablet, Rfl: 1 .  Dulaglutide (TRULICITY) 1.5 CZ/6.6AY SOPN, Inject 1.5 mg into the skin once a week., Disp: 12 mL, Rfl: 0 .  levothyroxine (SYNTHROID) 100 MCG tablet, TAKE 1 TABLET BY MOUTH ONCE DAILY AND ONLY ONE-HALF TABLET ON SUNDAYS, Disp: 94 tablet, Rfl: 0 .  metFORMIN (GLUCOPHAGE) 500 MG tablet, Take 1 tablet (500 mg total) by mouth 2 (two) times daily with a meal., Disp: 180 tablet, Rfl: 1 .  olmesartan-hydrochlorothiazide (BENICAR HCT) 20-12.5 MG tablet, Take 1 tablet by mouth daily., Disp: 90 tablet, Rfl: 1  No Known Allergies  I personally reviewed active problem list, medication list, allergies, family history, social history, health maintenance with the patient/caregiver today.   ROS  Ten systems reviewed and  is negative except as mentioned in HPI   Objective  Virtual encounter, vitals not obtained.  There is no height or weight on file to calculate BMI.  Physical Exam  Awake, alert and oriented   PHQ2/9: Depression screen [facility] 2/9 02/25/2021 11/26/2020 08/24/2020 04/20/2020 01/20/2020  Decreased Interest 0 0 0 0 2  Down, Depressed, Hopeless 0 0 0 0 0  PHQ - 2 Score 0 0 0 0 2  Altered sleeping - - - 0 0  Tired, decreased energy - - - 0 2  Change in appetite - - - 0 0  Feeling bad or failure about yourself  - - - 0 0  Trouble concentrating - - - 0 0  Moving slowly or fidgety/restless - - - 0 0  Suicidal thoughts - - - 0 0  PHQ-9 Score - - - 0 4  Difficult doing work/chores - - - Not difficult at all Not difficult at all  Some recent data might be hidden   PHQ-2/9 Result is negative.    Fall Risk: Fall Risk  02/25/2021 11/26/2020 08/24/2020 04/20/2020 01/20/2020  Falls in the past year? 0 0 0 0 0  Number falls in past yr: 0 0 0 0 0  Injury with Fall? 0 0 0 0 0  Follow up - - - Falls evaluation completed -    Assessment & Plan  1. Viral illness  - Novel Coronavirus, NAA ([facility])  2. Diabetes mellitus type 2 in obese (HCC)  - POCT HgB A1C - metFORMIN (GLUCOPHAGE) 500 MG tablet; Take 1 tablet (500 mg total) by mouth 2 (two) times daily with a meal.  Dispense: 180 tablet; Refill: 1 - Dulaglutide (TRULICITY) 1.5 TK/1.6WF SOPN; Inject 1.5 mg into the skin once a week.  Dispense: 12 mL; Refill: 0  3. Morbid obesity ([place])  Discussed with the patient the risk posed by an increased BMI. Discussed importance of portion control, calorie counting and at least 150 minutes of physical activity weekly. Avoid sweet beverages and drink more water. Eat at least 6 servings of fruit and vegetables daily   4. Dyslipidemia  - atorvastatin (LIPITOR) 40 MG tablet; Take 1 tablet (40 mg total) by mouth daily.  Dispense: 90 tablet; Refill: 1  5. Benign essential HTN  - olmesartan-hydrochlorothiazide  (BENICAR HCT) 20-12.5 MG tablet;  Take 1 tablet by mouth daily.  Dispense: 90 tablet; Refill: 1  6. Shift work sleep disorder   7. Amputated finger, sequela ([place])   8. Type 2 diabetes mellitus with microalbuminuria, without long-term current use of insulin (HCC)   9. Hypothyroidism, postop  - levothyroxine (SYNTHROID) 100 MCG tablet; TAKE 1 TABLET BY MOUTH ONCE DAILY AND ONLY ONE-HALF TABLET ON SUNDAYS  Dispense: 94 tablet; Refill: 0  10. History of thyroidectomy, total  - levothyroxine (SYNTHROID) 100 MCG tablet; TAKE 1 TABLET BY MOUTH ONCE DAILY AND ONLY ONE-HALF TABLET ON SUNDAYS  Dispense: 94 tablet; Refill: 0  I discussed the assessment and treatment plan with the patient. The patient was provided an opportunity to ask questions and all were answered. The patient agreed with the plan and demonstrated an understanding of the instructions.  The patient was advised to call back or seek an in-person evaluation if the symptoms worsen or if the condition fails to improve as anticipated.  I provided 25 minutes of non-face-to-face time during this encounter.

## 2021-02-25 ENCOUNTER — Telehealth (INDEPENDENT_AMBULATORY_CARE_PROVIDER_SITE_OTHER): Payer: BC Managed Care – PPO | Admitting: Family Medicine

## 2021-02-25 ENCOUNTER — Encounter: Payer: Self-pay | Admitting: Family Medicine

## 2021-02-25 DIAGNOSIS — E1169 Type 2 diabetes mellitus with other specified complication: Secondary | ICD-10-CM | POA: Diagnosis not present

## 2021-02-25 DIAGNOSIS — E1129 Type 2 diabetes mellitus with other diabetic kidney complication: Secondary | ICD-10-CM

## 2021-02-25 DIAGNOSIS — E890 Postprocedural hypothyroidism: Secondary | ICD-10-CM

## 2021-02-25 DIAGNOSIS — S68119S Complete traumatic metacarpophalangeal amputation of unspecified finger, sequela: Secondary | ICD-10-CM

## 2021-02-25 DIAGNOSIS — B349 Viral infection, unspecified: Secondary | ICD-10-CM | POA: Diagnosis not present

## 2021-02-25 DIAGNOSIS — E785 Hyperlipidemia, unspecified: Secondary | ICD-10-CM

## 2021-02-25 DIAGNOSIS — E669 Obesity, unspecified: Secondary | ICD-10-CM

## 2021-02-25 DIAGNOSIS — G4726 Circadian rhythm sleep disorder, shift work type: Secondary | ICD-10-CM

## 2021-02-25 DIAGNOSIS — I10 Essential (primary) hypertension: Secondary | ICD-10-CM

## 2021-02-25 DIAGNOSIS — R809 Proteinuria, unspecified: Secondary | ICD-10-CM

## 2021-02-25 LAB — POCT GLYCOSYLATED HEMOGLOBIN (HGB A1C): Hemoglobin A1C: 6.1 % — AB (ref 4.0–5.6)

## 2021-02-25 MED ORDER — TRULICITY 1.5 MG/0.5ML ~~LOC~~ SOAJ: 1.5000 mg | SUBCUTANEOUS | 0 refills | Status: DC

## 2021-02-25 MED ORDER — ATORVASTATIN CALCIUM 40 MG PO TABS: 40.0000 mg | ORAL_TABLET | Freq: Every day | ORAL | 1 refills | Status: DC

## 2021-02-25 MED ORDER — OLMESARTAN MEDOXOMIL-HCTZ 20-12.5 MG PO TABS: 1.0000 | ORAL_TABLET | Freq: Every day | ORAL | 1 refills | Status: DC

## 2021-02-25 MED ORDER — METFORMIN HCL 500 MG PO TABS: 500.0000 mg | ORAL_TABLET | Freq: Two times a day (BID) | ORAL | 1 refills | Status: DC

## 2021-02-25 MED ORDER — LEVOTHYROXINE SODIUM 100 MCG PO TABS: ORAL_TABLET | ORAL | 0 refills | Status: DC

## 2021-02-26 LAB — SARS-COV-2, NAA 2 DAY TAT

## 2021-02-26 LAB — NOVEL CORONAVIRUS, NAA: SARS-CoV-2, NAA: NOT DETECTED

## 2021-04-21 ENCOUNTER — Ambulatory Visit: Payer: Self-pay | Admitting: *Deleted

## 2021-04-21 NOTE — Telephone Encounter (Signed)
Called patient to try and schedule her with Dr. Ancil Boozer, but patient stated that she was seen at a walk in clinic earlier today and that she was feeling better.

## 2021-04-21 NOTE — Telephone Encounter (Signed)
If needs an appt please advise where to put her. Thanks

## 2021-04-21 NOTE — Telephone Encounter (Signed)
Pt called with complaints of her BP 157/87 at 0430 on 04/21/21; this reading was take on her left upper arm; she is also having dizziness x 2 weeks; the pt says it " "feel like about to hit the floor.. can't stand up"; recommendations made per nurse triage protocol; the pt verbalized understanding but does not want to go to the ED; she would like for someone in the office to check her BP; the pt can be contacted at [phone]; the pt sees Dr Ancil Boozer at [facility]; will route to office for notification.  Reason for Disposition . SEVERE dizziness (e.g., unable to stand, requires support to walk, feels like passing out now)  Answer Assessment - Initial Assessment Questions 1. DESCRIPTION: "Describe your dizziness."     "feel like about to hit the floor.. can't stand up" 2. LIGHTHEADED: "Do you feel lightheaded?" (e.g., somewhat faint, woozy, weak upon standing)     yes 3. VERTIGO: "Do you feel like either you or the room is spinning or tilting?" (i.e. vertigo)     no 4. SEVERITY: "How bad is it?"  "Do you feel like you are going to faint?" "Can you stand and walk?"   - MILD: Feels slightly dizzy, but walking normally.   - MODERATE: Feels unsteady when walking, but not falling; interferes with normal activities (e.g., school, work).   - SEVERE: Unable to walk without falling, or requires assistance to walk without falling; feels like passing out now.     moderate 5. ONSET:  "When did the dizziness begin?"     04/08/21 6. AGGRAVATING FACTORS: "Does anything make it worse?" (e.g., standing, change in head position)     Standing up 7. HEART RATE: "Can you tell me your heart rate?" "How many beats in 15 seconds?"  (Note: not all patients can do this)     no 8. CAUSE: "What do you think is causing the dizziness?"    ? BP 9. RECURRENT SYMPTOM: "Have you had dizziness before?" If Yes, ask: "When was the last time?" "What happened that time?"     no 10. OTHER SYMPTOMS: "Do you have any other  symptoms?" (e.g., fever, chest pain, vomiting, diarrhea, bleeding)      n/a 11. PREGNANCY: "Is there any chance you are pregnant?" "When was your last menstrual period?"       no  Protocols used: DIZZINESS [facility]

## 2021-04-26 ENCOUNTER — Other Ambulatory Visit: Payer: Self-pay | Admitting: Family Medicine

## 2021-04-26 DIAGNOSIS — I10 Essential (primary) hypertension: Secondary | ICD-10-CM

## 2021-05-03 LAB — HM DIABETES EYE EXAM

## 2021-06-07 NOTE — Progress Notes (Signed)
Name: Samantha Copeland   MRN: [id number]    DOB: 24-Jan-1961   Date:06/08/2021       Progress Note  Subjective  Chief Complaint  Follow Up  HPI  DMII with nephropathy  She is currently on Trulicity and metformin twice daily  6.5% to 6.8%,  6.7% , 6.8 % , down to 6.4 % , 6.1 %, she had A1C done May 2022 at [facility] and it was 6.2 %    She denies recent episode of hypoglycemia. Glucose at home has been well controlled lowest level 76 and highest 134 fasting, most of the time around 110 . Symptoms of carpal tunnel resolved with surgery in 2018, last urine micro was normal ( she is on ARB), last GFR also normal, callus improved with diabetic shoes and insoles. She also has obesity but lost weight since last visit, eating smaller portions and also walking daily   HTN: she has been taking Benicar 20/125 mg and bp was stable for a long time, however at the beginning of May she developed dizziness, bp was checked at work and it was over 180, she sent home, she went to Urgent care and because of labile HTN they added Norvasc 2.5 mg , she was diagnosed with OM and was treated with meclizine and augmenting. Symptoms of dizziness resolved, her bp during her follow up at [facility] was low. She has been feeling well, she is still taking Norvasc 2.5 mg., bp today is at goal. Discussed evaluation by cardiologist but she would like to hold off for now, she will get bp checked at work and if bp goes below 120/80's we can try stopping Norvasc , bp could have spiked due to infection and she scared about the dizziness sensation at the time     Hyperlipidemia: she is on Atorvastatin daily. LDL down to 40.  No myalgias Continue medication , we will recheck labs yearly    Obesity: BMI  she is on Trulicity, even though still working long hours, she continues to lose weight and is feeling better, more energy and wants to continue current regiment   Hypothyroidism: used to see Dr. Richardson Landry had total  thyroidectomy in December 2016, doing well, on Synthroid 100 mcg daily and half on Sundays. She  has been released by Dr. Gabriel Carina. 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     Patient Active Problem List   Diagnosis Date Noted   Amputated finger, sequela ([place]) 06/12/2019   Type 2 diabetes mellitus with pressure callus ([place]) 07/02/2018   Acute medial meniscus tear 04/06/2018   Osteoarthritis, knee 04/06/2018   Diabetic nephropathy associated with type 2 diabetes mellitus ([place]) 03/01/2018   Trigger thumb of right hand 05/31/2017   Status post de Quervain's release surgery 03/05/2017   Hypothyroidism, postop 03/14/2016   Benign essential HTN 05/30/2015   Carpal tunnel syndrome 05/30/2015   Dyslipidemia 05/30/2015   Diabetes mellitus type 2 with carpal tunnel syndrome ([place]) 05/30/2015   History of thyroidectomy, total 05/30/2015   Microalbuminuria 05/30/2015  Morbid obesity ([place]) 05/30/2015    Past Surgical History:  Procedure Laterality Date   ABDOMINAL HYSTERECTOMY     AMPUTATION FINGER Right 1963   Index Finger after injury as a infant   BREAST EXCISIONAL BIOPSY Right    benign   DORSAL COMPARTMENT RELEASE Left 01/18/2017   Procedure: RELEASE DORSAL COMPARTMENT (DEQUERVAIN);  Surgeon: Dereck Leep, MD;  Location: [facility] ORS;  Service: Orthopedics;  Laterality: Left;   FINE NEEDLE ASPIRATION  10/01/2013   Thyroid-Dr. Gabriel Carina   THYROIDECTOMY N/A 12/09/2015   Procedure: THYROIDECTOMY;  Surgeon: Clyde Canterbury, MD;  Location: [facility] ORS;  Service: ENT;  Laterality: N/A;    Family History   Problem Relation Age of Onset   Diabetes Mother    Diabetes Father    Hypertension Father    Hyperlipidemia Father    Cancer Father    Diabetes Sister    Breast cancer Neg Hx     Social History   Tobacco Use   Smoking status: Never   Smokeless tobacco: Never  Substance Use Topics   Alcohol use: Yes    Alcohol/week: 0.0 standard drinks    Comment: occasionally     Current Outpatient Medications:    acetaminophen (TYLENOL) 325 MG tablet, Take 650 mg by mouth every 6 (six) hours as needed., Disp: , Rfl:    aspirin EC 81 MG tablet, Take 81 mg by mouth every evening. , Disp: , Rfl:    atorvastatin (LIPITOR) 40 MG tablet, Take 1 tablet (40 mg total) by mouth daily., Disp: 90 tablet, Rfl: 1   Calcium-Phosphorus-Vitamin D (CALCIUM GUMMIES PO), Take 2 tablets by mouth daily., Disp: , Rfl:    Dulaglutide (TRULICITY) 1.5 JK/0.9FG SOPN, Inject 1.5 mg into the skin once a week., Disp: 12 mL, Rfl: 0   levothyroxine (SYNTHROID) 100 MCG tablet, TAKE 1 TABLET BY MOUTH ONCE DAILY AND ONLY ONE-HALF TABLET ON SUNDAYS, Disp: 94 tablet, Rfl: 0   metFORMIN (GLUCOPHAGE) 500 MG tablet, Take 1 tablet (500 mg total) by mouth 2 (two) times daily with a meal., Disp: 180 tablet, Rfl: 1   Multiple Vitamin (MULTIVITAMIN WITH MINERALS) TABS tablet, Take 1 tablet by mouth daily. ALIVE MULTIVITAMIN, Disp: , Rfl:    olmesartan-hydrochlorothiazide (BENICAR HCT) 20-12.5 MG tablet, Take 1 tablet by mouth daily., Disp: 90 tablet, Rfl: 1   vitamin B-12 (CYANOCOBALAMIN) 100 MCG tablet, Take 100 mcg by mouth daily., Disp: , Rfl:    modafinil (PROVIGIL) 100 MG tablet, Take 1 tablet (100 mg total) by mouth daily. Before going to work (Patient not taking: Reported on 06/08/2021), Disp: 30 tablet, Rfl: 2  No Known Allergies  I personally reviewed active problem list, medication list, allergies, family history, social history, health maintenance, notes from last encounter with the patient/caregiver  today.   ROS  Constitutional: Negative for fever, positive for weight change.  Respiratory: Negative for cough and shortness of breath.   Cardiovascular: Negative for chest pain or palpitations.  Gastrointestinal: Negative for abdominal pain, no bowel changes.  Musculoskeletal: Negative for gait problem or joint swelling.  Skin: Negative for rash.  Neurological: Negative for dizziness or headache.  No other specific complaints in a complete review of systems (except as listed in HPI above).   Objective  Vitals:   06/08/21 0855  BP: 132/70  Pulse: 74  Resp: 16  Temp: 98 F (36.7 C)  TempSrc: Oral  SpO2: 99%  Weight: 233 lb (105.7 kg)  Height: 5\' 8"  (1.727 m)    Body  mass index is 35.43 kg/m.  Physical Exam  Constitutional: Patient appears well-developed and well-nourished. Obese  No distress.  HEENT: head atraumatic, normocephalic, pupils equal and reactive to light,neck supple Cardiovascular: Normal rate, regular rhythm and normal heart sounds.  No murmur heard. No BLE edema. Pulmonary/Chest: Effort normal and breath sounds normal. No respiratory distress. Abdominal: Soft.  There is no tenderness. Psychiatric: Patient has a normal mood and affect. behavior is normal. Judgment and thought content normal.  Recent Results (from the past 2160 hour(s))  HM DIABETES EYE EXAM     Status: None   Collection Time: 05/03/21 12:00 AM  Result Value Ref Range   HM Diabetic Eye Exam No Retinopathy No Retinopathy    PHQ2/9: Depression screen [facility] 2/9 06/08/2021 02/25/2021 11/26/2020 08/24/2020 04/20/2020  Decreased Interest 0 0 0 0 0  Down, Depressed, Hopeless 0 0 0 0 0  PHQ - 2 Score 0 0 0 0 0  Altered sleeping - - - - 0  Tired, decreased energy - - - - 0  Change in appetite - - - - 0  Feeling bad or failure about yourself  - - - - 0  Trouble concentrating - - - - 0  Moving slowly or fidgety/restless - - - - 0  Suicidal thoughts - - - - 0  PHQ-9 Score - - - - 0  Difficult  doing work/chores - - - - Not difficult at all  Some recent data might be hidden    phq 9 is negative   Fall Risk: Fall Risk  06/08/2021 02/25/2021 11/26/2020 08/24/2020 04/20/2020  Falls in the past year? 0 0 0 0 0  Number falls in past yr: 0 0 0 0 0  Injury with Fall? 0 0 0 0 0  Follow up - - - - Falls evaluation completed      Functional Status Survey: Is the patient deaf or have difficulty hearing?: No Does the patient have difficulty seeing, even when wearing glasses/contacts?: No Does the patient have difficulty concentrating, remembering, or making decisions?: No Does the patient have difficulty walking or climbing stairs?: No Does the patient have difficulty dressing or bathing?: No Does the patient have difficulty doing errands alone such as visiting a doctor's office or shopping?: No    Assessment & Plan  1. Diabetes mellitus type 2 in obese (HCC)  - HgB A1c - Dulaglutide (TRULICITY) 1.5 VP/7.1GG SOPN; Inject 1.5 mg into the skin once a week.  Dispense: 6 mL; Refill: 1 - metFORMIN (GLUCOPHAGE) 500 MG tablet; Take 1 tablet (500 mg total) by mouth 2 (two) times daily with a meal.  Dispense: 180 tablet; Refill: 1  2. Shift work sleep disorder   3. Hypothyroidism, postop  - levothyroxine (SYNTHROID) 100 MCG tablet; TAKE 1 TABLET BY MOUTH ONCE DAILY AND ONLY ONE-HALF TABLET ON SUNDAYS  Dispense: 94 tablet; Refill: 1  4. Benign essential HTN  - amLODipine (NORVASC) 2.5 MG tablet; Take 1 tablet (2.5 mg total) by mouth daily.  Dispense: 90 tablet; Refill: 0  5. Dyslipidemia   6. Morbid obesity ([place])  Discussed with the patient the risk posed by an increased BMI. Discussed importance of portion control, calorie counting and at least 150 minutes of physical activity weekly. Avoid sweet beverages and drink more water. Eat at least 6 servings of fruit and vegetables daily    7. Microalbuminuria   8. History of thyroidectomy, total  - levothyroxine (SYNTHROID) 100  MCG tablet; TAKE 1 TABLET BY MOUTH ONCE  DAILY AND ONLY ONE-HALF TABLET ON SUNDAYS  Dispense: 94 tablet; Refill: 1  9. Amputated finger, sequela ([place])   10. Type 2 diabetes mellitus with microalbuminuria, without long-term current use of insulin (HCC)   11. Weight loss

## 2021-06-08 ENCOUNTER — Encounter: Payer: Self-pay | Admitting: Family Medicine

## 2021-06-08 ENCOUNTER — Other Ambulatory Visit: Payer: Self-pay

## 2021-06-08 ENCOUNTER — Ambulatory Visit (INDEPENDENT_AMBULATORY_CARE_PROVIDER_SITE_OTHER): Payer: BC Managed Care – PPO | Admitting: Family Medicine

## 2021-06-08 VITALS — BP 132/70 | HR 74 | Temp 98.0°F | Resp 16 | Ht 68.0 in | Wt 233.0 lb

## 2021-06-08 DIAGNOSIS — E669 Obesity, unspecified: Secondary | ICD-10-CM

## 2021-06-08 DIAGNOSIS — R809 Proteinuria, unspecified: Secondary | ICD-10-CM

## 2021-06-08 DIAGNOSIS — G4726 Circadian rhythm sleep disorder, shift work type: Secondary | ICD-10-CM

## 2021-06-08 DIAGNOSIS — I10 Essential (primary) hypertension: Secondary | ICD-10-CM

## 2021-06-08 DIAGNOSIS — E1169 Type 2 diabetes mellitus with other specified complication: Secondary | ICD-10-CM | POA: Diagnosis not present

## 2021-06-08 DIAGNOSIS — E1129 Type 2 diabetes mellitus with other diabetic kidney complication: Secondary | ICD-10-CM

## 2021-06-08 DIAGNOSIS — E890 Postprocedural hypothyroidism: Secondary | ICD-10-CM

## 2021-06-08 DIAGNOSIS — R634 Abnormal weight loss: Secondary | ICD-10-CM

## 2021-06-08 DIAGNOSIS — Z1231 Encounter for screening mammogram for malignant neoplasm of breast: Secondary | ICD-10-CM

## 2021-06-08 DIAGNOSIS — E785 Hyperlipidemia, unspecified: Secondary | ICD-10-CM

## 2021-06-08 DIAGNOSIS — S68119S Complete traumatic metacarpophalangeal amputation of unspecified finger, sequela: Secondary | ICD-10-CM

## 2021-06-08 MED ORDER — LEVOTHYROXINE SODIUM 100 MCG PO TABS: ORAL_TABLET | ORAL | 1 refills | Status: DC

## 2021-06-08 MED ORDER — TRULICITY 1.5 MG/0.5ML ~~LOC~~ SOAJ: 1.5000 mg | SUBCUTANEOUS | 1 refills | Status: DC

## 2021-06-08 MED ORDER — METFORMIN HCL 500 MG PO TABS: 500.0000 mg | ORAL_TABLET | Freq: Two times a day (BID) | ORAL | 1 refills | Status: DC

## 2021-06-08 MED ORDER — AMLODIPINE BESYLATE 2.5 MG PO TABS: 2.5000 mg | ORAL_TABLET | Freq: Every day | ORAL | 0 refills | Status: DC

## 2021-06-09 LAB — HEMOGLOBIN A1C
Hgb A1c MFr Bld: 6.3 % of total Hgb — ABNORMAL HIGH (ref <5.7)
Mean Plasma Glucose: 134 mg/dL
eAG (mmol/L): 7.4 mmol/L

## 2021-06-15 ENCOUNTER — Ambulatory Visit: Payer: Self-pay | Admitting: *Deleted

## 2021-06-15 NOTE — Telephone Encounter (Addendum)
See encounter.  Pt was calling wanting to talk to nurse, was treated states a couple weeks ago for an ear infection and now is having dizzy spells, no other symptoms, not all the time, just when walking sometimes. Pls FU at [phone]. Called office to speak to Dr Ancil Boozer, held for NT and couple min and went back to pt and states just wanted just cb as it was not that bad just sometimes, not shared at point of NT call.   Epic disconnected. Called patient to review symptoms of dizziness. Patient reports dizziness started yesterday. Feels lightheaded when walking. Denies chest pain, difficulty breathing no visual issues, no fever. Reports treated for ear infection several weeks ago . Went to [facility] . Completed antibiotics. Patient would like to know if dizziness is from recent ear infection. Reports she is drinking plenty and hydrated. No available appt until August. Patient would like to know if appt . available this week. Care advise given. Patient verbalized understanding of care advise and to call back or go to [facility] or ED if symptoms worsen.

## 2021-06-15 NOTE — Telephone Encounter (Signed)
Virtual appt sch'd with Rory Percy for tomorrow

## 2021-06-15 NOTE — Telephone Encounter (Signed)
Reason for Disposition  [1] MILD dizziness (e.g., walking normally) AND [2] has been evaluated by physician for this  Answer Assessment - Initial Assessment Questions 1. DESCRIPTION: "Describe your dizziness."     lightheadedness 2. LIGHTHEADED: "Do you feel lightheaded?" (e.g., somewhat faint, woozy, weak upon standing)     Ok to stand , dizziness when starts to walk 3. VERTIGO: "Do you feel like either you or the room is spinning or tilting?" (i.e. vertigo)     No  4. SEVERITY: "How bad is it?"  "Do you feel like you are going to faint?" "Can you stand and walk?"   - MILD: Feels slightly dizzy, but walking normally.   - MODERATE: Feels unsteady when walking, but not falling; interferes with normal activities (e.g., school, work).   - SEVERE: Unable to walk without falling, or requires assistance to walk without falling; feels like passing out now.      Mild  5. ONSET:  "When did the dizziness begin?"     Yesterday  6. AGGRAVATING FACTORS: "Does anything make it worse?" (e.g., standing, change in head position)     Walking  7. HEART RATE: "Can you tell me your heart rate?" "How many beats in 15 seconds?"  (Note: not all patients can do this)       Na  8. CAUSE: "What do you think is causing the dizziness?"     Had ear infection several weeks ago  9. RECURRENT SYMPTOM: "Have you had dizziness before?" If Yes, ask: "When was the last time?" "What happened that time?"     Yes had a ear infection 10. OTHER SYMPTOMS: "Do you have any other symptoms?" (e.g., fever, chest pain, vomiting, diarrhea, bleeding)       Denies  11. PREGNANCY: "Is there any chance you are pregnant?" "When was your last menstrual period?"       na  Protocols used: Dizziness - Lightheadedness-A-AH

## 2021-06-16 ENCOUNTER — Telehealth (INDEPENDENT_AMBULATORY_CARE_PROVIDER_SITE_OTHER): Payer: BC Managed Care – PPO | Admitting: Family Medicine

## 2021-06-16 ENCOUNTER — Encounter: Payer: Self-pay | Admitting: Family Medicine

## 2021-06-16 ENCOUNTER — Other Ambulatory Visit: Payer: Self-pay

## 2021-06-16 DIAGNOSIS — R42 Dizziness and giddiness: Secondary | ICD-10-CM | POA: Diagnosis not present

## 2021-06-16 MED ORDER — MECLIZINE HCL 12.5 MG PO TABS: 12.5000 mg | ORAL_TABLET | Freq: Three times a day (TID) | ORAL | 0 refills | Status: DC | PRN

## 2021-06-16 NOTE — Progress Notes (Signed)
Virtual Visit via Phone Note  I connected with Samantha Copeland on 06/16/21 at 11:40 AM EDT by phone and verified that I am speaking with the correct person using two identifiers.  Location: Patient: home Provider: [facility]   I discussed the limitations of evaluation and management by telemedicine and the availability of in person appointments. The patient expressed understanding and agreed to proceed.  History of Present Illness:  DIZZINESS - ear infection couple weeks ago with associated dizziness. At that time, BP was elevated, added on norvasc 2.5mg . BP at recent visit 6/28 wnl. Also on benicar for HTN.  - sometimes will have to sit down. - hasn't been checking BP   Duration:  about a week Duration of episode:  2-3 minutes Provoking factors: movement Triggered by rolling over in bed: no Triggered by bending over: yes Aggravated by head movement: no Aggravated by exertion, coughing, loud noises: no Recent head injury: no Recent or current viral symptoms:  recent AOM a few weeks ago History of vasovagal episodes: no Nausea: no Vomiting: no Tinnitus: no Hearing loss: no Aural fullness: yes Headache: no Photophobia/phonophobia: no Unsteady gait:  once Diplopia, dysarthria, dysphagia or weakness: no Diaphoresis: yes Dyspnea: no Chest pain: no    Observations/Objective:  Patient had trouble connecting to video visit, entirety of visit conducted over the phone.  Speaks in full sentences, no respiratory distress.   Assessment and Plan:  Dizziness Suspect 2/2 vertigo given onset and character of symptoms. Will provide short course of meclizine. Possible to have orthostatic symptoms as well given recent addition of norvasc. If no improvement seen with meclizine, recommend in person appointment to fully evaluate with orthostatics, neuro and HEENT exam. Emergency precautions reviewed.    I discussed the assessment and treatment plan with the patient. The patient was  provided an opportunity to ask questions and all were answered. The patient agreed with the plan and demonstrated an understanding of the instructions.   The patient was advised to call back or seek an in-person evaluation if the symptoms worsen or if the condition fails to improve as anticipated.  I provided 10 minutes of non-face-to-face time during this encounter.   Myles Gip, DO

## 2021-06-21 ENCOUNTER — Other Ambulatory Visit: Payer: Self-pay

## 2021-06-21 ENCOUNTER — Ambulatory Visit: Payer: BC Managed Care – PPO

## 2021-06-21 VITALS — BP 136/78

## 2021-06-21 DIAGNOSIS — Z0130 Encounter for examination of blood pressure without abnormal findings: Secondary | ICD-10-CM

## 2021-08-18 ENCOUNTER — Ambulatory Visit
Admission: RE | Admit: 2021-08-18 | Discharge: 2021-08-18 | Disposition: A | Discharge status: Home / Self Care | Payer: BC Managed Care – PPO | Source: Ambulatory Visit | Attending: Family Medicine | Admitting: Family Medicine

## 2021-08-18 ENCOUNTER — Other Ambulatory Visit: Payer: Self-pay

## 2021-08-18 DIAGNOSIS — Z1231 Encounter for screening mammogram for malignant neoplasm of breast: Secondary | ICD-10-CM | POA: Diagnosis not present

## 2021-08-20 ENCOUNTER — Other Ambulatory Visit: Payer: Self-pay | Admitting: Family Medicine

## 2021-08-20 DIAGNOSIS — R928 Other abnormal and inconclusive findings on diagnostic imaging of breast: Secondary | ICD-10-CM

## 2021-08-20 DIAGNOSIS — R921 Mammographic calcification found on diagnostic imaging of breast: Secondary | ICD-10-CM

## 2021-08-23 ENCOUNTER — Other Ambulatory Visit: Payer: Self-pay

## 2021-08-23 DIAGNOSIS — N6310 Unspecified lump in the right breast, unspecified quadrant: Secondary | ICD-10-CM

## 2021-08-23 NOTE — Progress Notes (Signed)
Order placed per Dr. Ancil Boozer.

## 2021-08-24 ENCOUNTER — Other Ambulatory Visit: Payer: Self-pay

## 2021-08-24 ENCOUNTER — Ambulatory Visit
Admission: RE | Admit: 2021-08-24 | Discharge: 2021-08-24 | Disposition: A | Discharge status: Home / Self Care | Payer: BC Managed Care – PPO | Source: Ambulatory Visit | Attending: Family Medicine | Admitting: Family Medicine

## 2021-08-24 DIAGNOSIS — R922 Inconclusive mammogram: Secondary | ICD-10-CM | POA: Diagnosis not present

## 2021-08-24 DIAGNOSIS — R928 Other abnormal and inconclusive findings on diagnostic imaging of breast: Secondary | ICD-10-CM | POA: Diagnosis not present

## 2021-08-24 DIAGNOSIS — R921 Mammographic calcification found on diagnostic imaging of breast: Secondary | ICD-10-CM | POA: Diagnosis not present

## 2021-08-25 ENCOUNTER — Other Ambulatory Visit: Payer: Self-pay | Admitting: Family Medicine

## 2021-08-25 DIAGNOSIS — R921 Mammographic calcification found on diagnostic imaging of breast: Secondary | ICD-10-CM

## 2021-08-25 DIAGNOSIS — R928 Other abnormal and inconclusive findings on diagnostic imaging of breast: Secondary | ICD-10-CM

## 2021-09-02 ENCOUNTER — Other Ambulatory Visit: Payer: Self-pay

## 2021-09-02 ENCOUNTER — Ambulatory Visit
Admission: RE | Admit: 2021-09-02 | Discharge: 2021-09-02 | Disposition: A | Discharge status: Home / Self Care | Payer: BC Managed Care – PPO | Source: Ambulatory Visit | Attending: Family Medicine | Admitting: Family Medicine

## 2021-09-02 DIAGNOSIS — R921 Mammographic calcification found on diagnostic imaging of breast: Secondary | ICD-10-CM | POA: Diagnosis not present

## 2021-09-02 DIAGNOSIS — R928 Other abnormal and inconclusive findings on diagnostic imaging of breast: Secondary | ICD-10-CM | POA: Diagnosis not present

## 2021-09-02 DIAGNOSIS — N6310 Unspecified lump in the right breast, unspecified quadrant: Secondary | ICD-10-CM

## 2021-09-02 DIAGNOSIS — N6021 Fibroadenosis of right breast: Secondary | ICD-10-CM | POA: Diagnosis not present

## 2021-09-02 HISTORY — PX: BREAST BIOPSY: SHX20

## 2021-09-03 LAB — SURGICAL PATHOLOGY

## 2021-09-06 ENCOUNTER — Other Ambulatory Visit: Payer: Self-pay | Admitting: Family Medicine

## 2021-09-06 DIAGNOSIS — E785 Hyperlipidemia, unspecified: Secondary | ICD-10-CM

## 2021-09-06 NOTE — Telephone Encounter (Signed)
Pt has an appt 10/4

## 2021-09-08 ENCOUNTER — Other Ambulatory Visit: Payer: Self-pay | Admitting: Family Medicine

## 2021-09-08 DIAGNOSIS — E785 Hyperlipidemia, unspecified: Secondary | ICD-10-CM

## 2021-09-13 NOTE — Progress Notes (Signed)
Name: Samantha Copeland   MRN: [id number]    DOB: 1961/09/28   Date:09/14/2021       Progress Note  Subjective  Chief Complaint  Follow Up  HPI  DMII with nephropathy  She is currently on Trulicity and metformin twice daily  6.5% to 6.8%,  6.7% , 6.8 % , down to 6.4 % , 6.1 %, 6.2 % and today is 61 %   She denies recent episode of hypoglycemia. FSBS in the low 100;'s . Symptoms of carpal tunnel resolved with surgery in 2018, last urine micro was normal ( she is on ARB), last GFR also normal, callus improved with diabetic shoes and insoles. She also has obesity and weight is stable.   History of abnormal mammogram: biopsy negative she is concerned about the stiches.   HTN: she has been taking Benicar 20/125 mg and low dose Norvasc since earlier 2022, no longer having bp spikes, no chest pain or palpitation.     Hyperlipidemia: she is on Atorvastatin daily. LDL down to 40.  No myalgias Continue medication , we will recheck labs today    Obesity: BMI  she is on Trulicity, even though still working long hours, weight is now stable at 233 lbs. Tolerating medication well.    Hypothyroidism: used to see Dr. Richardson Landry had total thyroidectomy in December 2016, doing well, on Synthroid 100 mcg daily and half on Sundays. She  has been released by Dr. Gabriel Carina. We will recheck labs today    OA and right meniscal tear: she was seen by Ortho , symptoms resolved, she was walking 3 miles daily but too busy at work and has not been able to be active outside of work.    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 Unchanged   Dizziness : it started after an ear infection a couple of months ago, was taking meclizine prn, no episodes in the past two weeks, denies hearing loss, there was no spinning sensation just felt light headed   Patient  Active Problem List   Diagnosis Date Noted   Amputated finger, sequela ([place]) 06/12/2019   Type 2 diabetes mellitus with pressure callus ([place]) 07/02/2018   Acute medial meniscus tear 04/06/2018   Osteoarthritis, knee 04/06/2018   Diabetic nephropathy associated with type 2 diabetes mellitus ([place]) 03/01/2018   Trigger thumb of right hand 05/31/2017   Status post de Quervain's release surgery 03/05/2017   Hypothyroidism, postop 03/14/2016   Benign essential HTN 05/30/2015   Carpal tunnel syndrome 05/30/2015   Dyslipidemia 05/30/2015   Diabetes mellitus type 2 with carpal tunnel syndrome ([place]) 05/30/2015   History of thyroidectomy, total 05/30/2015   Microalbuminuria 05/30/2015   Morbid obesity ([place]) 05/30/2015    Past Surgical History:  Procedure Laterality Date   ABDOMINAL HYSTERECTOMY     AMPUTATION FINGER Right 1963   Index Finger after injury as a infant   BREAST BIOPSY Right 09/02/2021   Affirm bx-calcs"coil" clip-path pending   BREAST EXCISIONAL BIOPSY Right    benign   DORSAL COMPARTMENT RELEASE Left 01/18/2017   Procedure: RELEASE DORSAL COMPARTMENT (DEQUERVAIN);  Surgeon: Dereck Leep, MD;  Location: [facility] ORS;  Service: Orthopedics;  Laterality: Left;   FINE NEEDLE ASPIRATION  10/01/2013   Thyroid-Dr. Gabriel Carina   THYROIDECTOMY N/A 12/09/2015   Procedure: THYROIDECTOMY;  Surgeon:  Clyde Canterbury, MD;  Location: [facility] ORS;  Service: ENT;  Laterality: N/A;    Family History  Problem Relation Age of Onset   Diabetes Mother    Diabetes Father    Hypertension Father    Hyperlipidemia Father    Cancer Father    Diabetes Sister    Breast cancer Neg Hx     Social History   Tobacco Use   Smoking status: Never   Smokeless tobacco: Never  Substance Use Topics   Alcohol use: Yes    Alcohol/week: 0.0 standard drinks    Comment: occasionally     Current Outpatient Medications:    acetaminophen (TYLENOL) 325 MG tablet, Take 650 mg by mouth every 6 (six) hours as needed.,  Disp: , Rfl:    aspirin EC 81 MG tablet, Take 81 mg by mouth every evening. , Disp: , Rfl:    Calcium-Phosphorus-Vitamin D (CALCIUM GUMMIES PO), Take 2 tablets by mouth daily., Disp: , Rfl:    Dulaglutide (TRULICITY) 1.5 TZ/0.0FV SOPN, Inject 1.5 mg into the skin once a week., Disp: 6 mL, Rfl: 1   levothyroxine (SYNTHROID) 100 MCG tablet, TAKE 1 TABLET BY MOUTH ONCE DAILY AND ONLY ONE-HALF TABLET ON SUNDAYS, Disp: 94 tablet, Rfl: 1   meclizine (ANTIVERT) 12.5 MG tablet, Take 1 tablet (12.5 mg total) by mouth 3 (three) times daily as needed for dizziness., Disp: 30 tablet, Rfl: 0   metFORMIN (GLUCOPHAGE) 500 MG tablet, Take 1 tablet (500 mg total) by mouth 2 (two) times daily with a meal., Disp: 180 tablet, Rfl: 1   Multiple Vitamin (MULTIVITAMIN WITH MINERALS) TABS tablet, Take 1 tablet by mouth daily. ALIVE MULTIVITAMIN, Disp: , Rfl:    vitamin B-12 (CYANOCOBALAMIN) 100 MCG tablet, Take 100 mcg by mouth daily., Disp: , Rfl:    amLODipine (NORVASC) 2.5 MG tablet, Take 1 tablet (2.5 mg total) by mouth daily., Disp: 90 tablet, Rfl: 1   atorvastatin (LIPITOR) 40 MG tablet, Take 1 tablet (40 mg total) by mouth daily., Disp: 90 tablet, Rfl: 1   olmesartan-hydrochlorothiazide (BENICAR HCT) 20-12.5 MG tablet, Take 1 tablet by mouth daily., Disp: 90 tablet, Rfl: 1  No Known Allergies  I personally reviewed active problem list, medication list, allergies, family history, social history, health maintenance with the patient/caregiver today.   ROS  Constitutional: Negative for fever or weight change.  Respiratory: Negative for cough and shortness of breath.   Cardiovascular: Negative for chest pain or palpitations.  Gastrointestinal: Negative for abdominal pain, no bowel changes.  Musculoskeletal: Negative for gait problem or joint swelling.  Skin: Negative for rash.  Neurological: Negative for dizziness or headache.  No other specific complaints in a complete review of systems (except as listed in  HPI above).   Objective  Vitals:   09/14/21 1012  BP: 120/80  Pulse: 70  Resp: 16  Temp: 98.1 F (36.7 C)  SpO2: 99%  Weight: 233 lb (105.7 kg)  Height: 5\' 8"  (1.727 m)    Body mass index is 35.43 kg/m.  Physical Exam  Constitutional: Patient appears well-developed and well-nourished. Obese  No distress.  HEENT: head atraumatic, normocephalic, pupils equal and reactive to light, neck supple Cardiovascular: Normal rate, regular rhythm and normal heart sounds.  No murmur heard. No BLE edema. Breast : area of biopsy in good aspect, advised not to remove steri-strips  Pulmonary/Chest: Effort normal and breath sounds normal. No respiratory distress. Abdominal: Soft.  There is no tenderness. Psychiatric: Patient has a normal mood and affect.  behavior is normal. Judgment and thought content normal.   Recent Results (from the past 2160 hour(s))  Surgical pathology     Status: None   Collection Time: 09/02/21 11:42 AM  Result Value Ref Range   SURGICAL PATHOLOGY      SURGICAL PATHOLOGY CASE: [id number] PATIENT: Apolonio Schneiders Surgical Pathology Report     Specimen Submitted: A. Breast, right  Clinical History: Indeterminate coarse heterogenous calcifications, 4 mm.  FA, fat necrosis, DCIS, IMC. Coil-shaped clip placed following stereotactic biopsy of RIGHT breast, upper outer quadrant.    DIAGNOSIS: A. BREAST, RIGHT UPPER OUTER QUADRANT; STEREOTACTIC CORE NEEDLE BIOPSY: - BENIGN FIBROADENOMA, WITH ASSOCIATED COARSE DYSTROPHIC CALCIFICATIONS. - NEGATIVE FOR ATYPICAL PROLIFERATIVE BREAST DISEASE.  GROSS DESCRIPTION: A. Labeled: Right breast stereo biopsy calcs outer breast posterior depth Received: in a formalin-filled [place] collection device Specimen radiograph image(s) available for review Time/Date in fixative: Collected at 11:42 AM on 09/02/2021 and placed in formalin at 11:46 AM on 09/02/2021 Cold ischemic time: Less than 5 minutes Total fixation time:  Approximately 8 hours Core pieces: Multiple Measurement:  Aggregate, 6.7 x 1.8 x 0.4 cm Description / comments: Received are cores and fragments of hemorrhagic yellow fibrofatty tissue.  The accompanying diagram has sections A, C, and F checked. Inked: Blue Entirely submitted in cassette(s):  1 - section A 2 - section C 3 - section F 4 - 6 - remaining tissue fragments      4 - sections B and D      5 - sections E and G      6 - section H and remaining free-floating fragments  RB 09/02/2021   Final Diagnosis performed by Allena Napoleon, MD.   Electronically signed 09/03/2021 10:16:02AM The electronic signature indicates that the named Attending Pathologist has evaluated the specimen Technical component performed at [facility], [address] [place], [place] [address] Lab: [phone] Dir: Rush Farmer, MD, MMM  Professional component performed at [facility], [place], [place], [place] [address] Lab: [phone] Dir: Dellia Nims. Rubinas, MD   POCT HgB A1C     Status: Abnormal   Collection Time: 09/14/21 10:23 AM  Result Value Ref Range   Hemoglobin A1C 6.1 (A) 4.0 - 5.6 %   HbA1c POC (<> result, manual entry)     HbA1c, POC (prediabetic range)     HbA1c, POC (controlled diabetic range)      Diabetic Foot Exam: Diabetic Foot Exam - Simple   Simple Foot Form Diabetic Foot exam was performed with the following findings: Yes 09/14/2021 10:55 AM  Visual Inspection No deformities, no ulcerations, no other skin breakdown bilaterally: Yes Sensation Testing Intact to touch and monofilament testing bilaterally: Yes Pulse Check Posterior Tibialis and Dorsalis pulse intact bilaterally: Yes Comments      PHQ2/9: Depression screen [facility] 2/9 09/14/2021 06/16/2021 06/08/2021 02/25/2021 11/26/2020  Decreased Interest 0 0 0 0 0  Down, Depressed, Hopeless 0 0 0 0 0  PHQ - 2 Score 0 0 0 0 0  Altered sleeping - - - - -  Tired, decreased energy - - - - -   Change in appetite - - - - -  Feeling bad or failure about yourself  - - - - -  Trouble concentrating - - - - -  Moving slowly or fidgety/restless - - - - -  Suicidal thoughts - - - - -  PHQ-9 Score - - - - -  Difficult doing work/chores - - - - -  Some recent data might be hidden    phq 9 is negative   Fall Risk: Fall Risk  09/14/2021 06/16/2021 06/08/2021 02/25/2021 11/26/2020  Falls in the past year? 0 0 0 0 0  Number falls in past yr: 0 0 0 0 0  Injury with Fall? 0 0 0 0 0  Risk for fall due to : No Fall Risks - - - -  Follow up Falls prevention discussed Falls evaluation completed - - -      Functional Status Survey: Is the patient deaf or have difficulty hearing?: No Does the patient have difficulty seeing, even when wearing glasses/contacts?: No Does the patient have difficulty concentrating, remembering, or making decisions?: No Does the patient have difficulty walking or climbing stairs?: No Does the patient have difficulty dressing or bathing?: No Does the patient have difficulty doing errands alone such as visiting a doctor's office or shopping?: No    Assessment & Plan  1. Diabetes mellitus type 2 in obese (HCC)  - POCT HgB A1C - HM Diabetes Foot Exam  2. Need for immunization against influenza  - Flu Vaccine QUAD 42mo+IM (Fluarix, Fluzone & Alfiuria Quad PF)  3. Dyslipidemia  - Lipid panel - atorvastatin (LIPITOR) 40 MG tablet; Take 1 tablet (40 mg total) by mouth daily.  Dispense: 90 tablet; Refill: 1  4. Microalbuminuria   5. Benign essential HTN  - COMPLETE METABOLIC PANEL WITH GFR - CBC with Differential/Platelet - amLODipine (NORVASC) 2.5 MG tablet; Take 1 tablet (2.5 mg total) by mouth daily.  Dispense: 90 tablet; Refill: 1 - olmesartan-hydrochlorothiazide (BENICAR HCT) 20-12.5 MG tablet; Take 1 tablet by mouth daily.  Dispense: 90 tablet; Refill: 1  6. History of thyroidectomy, total  - TSH  7. Type 2 diabetes mellitus with  microalbuminuria, without long-term current use of insulin (HCC)  - Microalbumin / creatinine urine ratio  8. Amputated finger, sequela ([place])

## 2021-09-14 ENCOUNTER — Encounter: Payer: Self-pay | Admitting: Family Medicine

## 2021-09-14 ENCOUNTER — Other Ambulatory Visit: Payer: Self-pay

## 2021-09-14 ENCOUNTER — Ambulatory Visit (INDEPENDENT_AMBULATORY_CARE_PROVIDER_SITE_OTHER): Payer: BC Managed Care – PPO | Admitting: Family Medicine

## 2021-09-14 VITALS — BP 120/80 | HR 70 | Temp 98.1°F | Resp 16 | Ht 68.0 in | Wt 233.0 lb

## 2021-09-14 DIAGNOSIS — Z23 Encounter for immunization: Secondary | ICD-10-CM

## 2021-09-14 DIAGNOSIS — E1169 Type 2 diabetes mellitus with other specified complication: Secondary | ICD-10-CM

## 2021-09-14 DIAGNOSIS — E890 Postprocedural hypothyroidism: Secondary | ICD-10-CM | POA: Diagnosis not present

## 2021-09-14 DIAGNOSIS — R809 Proteinuria, unspecified: Secondary | ICD-10-CM

## 2021-09-14 DIAGNOSIS — E785 Hyperlipidemia, unspecified: Secondary | ICD-10-CM | POA: Diagnosis not present

## 2021-09-14 DIAGNOSIS — E669 Obesity, unspecified: Secondary | ICD-10-CM

## 2021-09-14 DIAGNOSIS — E1129 Type 2 diabetes mellitus with other diabetic kidney complication: Secondary | ICD-10-CM | POA: Diagnosis not present

## 2021-09-14 DIAGNOSIS — I10 Essential (primary) hypertension: Secondary | ICD-10-CM

## 2021-09-14 DIAGNOSIS — S68119S Complete traumatic metacarpophalangeal amputation of unspecified finger, sequela: Secondary | ICD-10-CM

## 2021-09-14 LAB — POCT GLYCOSYLATED HEMOGLOBIN (HGB A1C): Hemoglobin A1C: 6.1 % — AB (ref 4.0–5.6)

## 2021-09-14 MED ORDER — ATORVASTATIN CALCIUM 40 MG PO TABS: 40.0000 mg | ORAL_TABLET | Freq: Every day | ORAL | 1 refills | Status: DC

## 2021-09-14 MED ORDER — OLMESARTAN MEDOXOMIL-HCTZ 20-12.5 MG PO TABS: 1.0000 | ORAL_TABLET | Freq: Every day | ORAL | 1 refills | Status: DC

## 2021-09-14 MED ORDER — AMLODIPINE BESYLATE 2.5 MG PO TABS: 2.5000 mg | ORAL_TABLET | Freq: Every day | ORAL | 1 refills | Status: DC

## 2021-09-15 LAB — CBC WITH DIFFERENTIAL/PLATELET
Absolute Monocytes: 454 cells/uL (ref 200–950)
Basophils Absolute: 19 cells/uL (ref 0–200)
Basophils Relative: 0.3 %
Eosinophils Absolute: 88 cells/uL (ref 15–500)
Eosinophils Relative: 1.4 %
HCT: 42.3 % (ref 35.0–45.0)
Hemoglobin: 13.7 g/dL (ref 11.7–15.5)
Lymphs Abs: 2615 cells/uL (ref 850–3900)
MCH: 27.8 pg (ref 27.0–33.0)
MCHC: 32.4 g/dL (ref 32.0–36.0)
MCV: 86 fL (ref 80.0–100.0)
MPV: 11.2 fL (ref 7.5–12.5)
Monocytes Relative: 7.2 %
Neutro Abs: 3125 cells/uL (ref 1500–7800)
Neutrophils Relative %: 49.6 %
Platelets: 226 10*3/uL (ref 140–400)
RBC: 4.92 10*6/uL (ref 3.80–5.10)
RDW: 14.8 % (ref 11.0–15.0)
Total Lymphocyte: 41.5 %
WBC: 6.3 10*3/uL (ref 3.8–10.8)

## 2021-09-15 LAB — MICROALBUMIN / CREATININE URINE RATIO
Creatinine, Urine: 86 mg/dL (ref 20–275)
Microalb Creat Ratio: 2 mcg/mg creat (ref <30)
Microalb, Ur: 0.2 mg/dL

## 2021-09-15 LAB — TSH: TSH: 0.59 mIU/L (ref 0.40–4.50)

## 2021-09-15 LAB — COMPLETE METABOLIC PANEL WITH GFR
AG Ratio: 1.4 (calc) (ref 1.0–2.5)
ALT: 11 U/L (ref 6–29)
AST: 14 U/L (ref 10–35)
Albumin: 4.2 g/dL (ref 3.6–5.1)
Alkaline phosphatase (APISO): 78 U/L (ref 37–153)
BUN/Creatinine Ratio: 12 (calc) (ref 6–22)
BUN: 13 mg/dL (ref 7–25)
CO2: 29 mmol/L (ref 20–32)
Calcium: 9.6 mg/dL (ref 8.6–10.4)
Chloride: 103 mmol/L (ref 98–110)
Creat: 1.05 mg/dL — ABNORMAL HIGH (ref 0.50–1.03)
Globulin: 3.1 g/dL (calc) (ref 1.9–3.7)
Glucose, Bld: 92 mg/dL (ref 65–139)
Potassium: 3.7 mmol/L (ref 3.5–5.3)
Sodium: 141 mmol/L (ref 135–146)
Total Bilirubin: 0.6 mg/dL (ref 0.2–1.2)
Total Protein: 7.3 g/dL (ref 6.1–8.1)
eGFR: 61 mL/min/{1.73_m2} (ref >=60)

## 2021-09-15 LAB — LIPID PANEL
Cholesterol: 145 mg/dL (ref <200)
HDL: 95 mg/dL (ref >=50)
LDL Cholesterol (Calc): 37 mg/dL (calc)
Non-HDL Cholesterol (Calc): 50 mg/dL (calc) (ref <130)
Total CHOL/HDL Ratio: 1.5 (calc) (ref <5.0)
Triglycerides: 58 mg/dL (ref <150)

## 2021-10-27 ENCOUNTER — Other Ambulatory Visit: Payer: Self-pay | Admitting: Family Medicine

## 2021-10-27 DIAGNOSIS — I10 Essential (primary) hypertension: Secondary | ICD-10-CM

## 2021-12-02 ENCOUNTER — Encounter: Payer: BC Managed Care – PPO | Admitting: Family Medicine

## 2021-12-25 ENCOUNTER — Other Ambulatory Visit: Payer: Self-pay | Admitting: Family Medicine

## 2021-12-25 DIAGNOSIS — E1169 Type 2 diabetes mellitus with other specified complication: Secondary | ICD-10-CM

## 2021-12-25 DIAGNOSIS — E669 Obesity, unspecified: Secondary | ICD-10-CM

## 2021-12-25 NOTE — Telephone Encounter (Signed)
Requested Prescriptions  Pending Prescriptions Disp Refills   metFORMIN (GLUCOPHAGE) 500 MG tablet [Pharmacy Med Name: metFORMIN HCl 500 MG Oral Tablet] 180 tablet 0    Sig: TAKE 1 TABLET BY MOUTH TWICE DAILY WITH A MEAL     Endocrinology:  Diabetes - Biguanides Failed - 12/25/2021  1:25 PM      Failed - Cr in normal range and within 360 days    Creat  Date Value Ref Range Status  09/14/2021 1.05 (H) 0.50 - 1.03 mg/dL Final   Creatinine, Urine  Date Value Ref Range Status  09/14/2021 86 20 - 275 mg/dL Final         Failed - AA eGFR in normal range and within 360 days    GFR, Est African American  Date Value Ref Range Status  08/24/2020 69 > OR = 60 mL/min/1.45m2 Final   GFR, Est Non African American  Date Value Ref Range Status  08/24/2020 59 (L) > OR = 60 mL/min/1.85m2 Final   eGFR  Date Value Ref Range Status  09/14/2021 61 > OR = 60 mL/min/1.10m2 Final    Comment:    The eGFR is based on the CKD-EPI 2021 equation. To calculate  the new eGFR from a previous Creatinine or Cystatin C result, go to https://www.kidney.org/professionals/ kdoqi/gfr%5Fcalculator          Passed - HBA1C is between 0 and 7.9 and within 180 days    Hemoglobin A1C  Date Value Ref Range Status  09/14/2021 6.1 (A) 4.0 - 5.6 % Final   HbA1c, POC (controlled diabetic range)  Date Value Ref Range Status  01/20/2020 6.8 0.0 - 7.0 % Final   Hgb A1c MFr Bld  Date Value Ref Range Status  06/08/2021 6.3 (H) <5.7 % of total Hgb Final    Comment:    For someone without known diabetes, a hemoglobin  A1c value between 5.7% and 6.4% is consistent with prediabetes and should be confirmed with a  follow-up test. . For someone with known diabetes, a value <7% indicates that their diabetes is well controlled. A1c targets should be individualized based on duration of diabetes, age, comorbid conditions, and other considerations. . This assay result is consistent with an increased risk of  diabetes. . Currently, no consensus exists regarding use of hemoglobin A1c for diagnosis of diabetes for children. Renella Cunas - Valid encounter within last 6 months    Recent Outpatient Visits          3 months ago Diabetes mellitus type 2 in obese [facility])   [facility] Steele Sizer, MD   6 months ago [facility] Rory Percy M, DO   6 months ago Diabetes mellitus type 2 in obese [facility])   [facility] Steele Sizer, MD   10 months ago Diabetes mellitus type 2 in obese [facility])   [facility] Steele Sizer, MD   1 year ago Diabetes mellitus type 2 in obese [facility])   [facility] Steele Sizer, MD      Future Appointments            In 1 week Steele Sizer, MD [facility], [place]   In 2 months Steele Sizer, MD [facility], [facility]

## 2022-01-03 NOTE — Progress Notes (Signed)
Name: Samantha Copeland   MRN: [id number]    DOB: [birth year]   Date:01/04/2022       Progress Note  Subjective  Chief Complaint  Follow Up  HPI  DMII with nephropathy  She is currently on Trulicity and metformin twice daily  6.5% to 6.8%,  6.7% , 6.8 % , 6.4 % , 6.1 %, 6.2 % , 6.1 % today it is down to 6 %    She states glucose went down to 74 earlier this week after work, but usually in the mid 80's-100's . Symptoms of carpal tunnel resolved with surgery in 2018, last urine micro was normal ( she is on ARB), last GFR also normal, callus improved with diabetic shoes and insoles. She also has obesity and weight is stable. We will try stopping Trulicity due to cost, we will wean her off GLP-1 agonist by going on Ozempic 0.25 mg weekly   History of abnormal mammogram: biopsy negative she is concerned about the stiches. Repeat in 08/2022   HTN: she has been taking Benicar 20/125 mg and low dose Norvasc since earlier 2022,  bp has been well controlled, no chest pain , dizziness or palpitation.     Hyperlipidemia: she is on Atorvastatin daily. LDL down to 37 .  No myalgias.  Obesity: BMI  she is on Trulicity, even though still working long hours, weight is now stable at 232 lbs. We will try to stop medication due to cost    Hypothyroidism: used to see Dr. Richardson Landry had total thyroidectomy in December 2016, doing well, on Synthroid 100 mcg daily and half on Sundays. She  has been released by Dr. Gabriel Carina. 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 , she stopped taking Modafinil , she no longer needs it to stay alert    Patient Active Problem List   Diagnosis Date Noted   Amputated finger, sequela ([place]) 06/12/2019   Type 2 diabetes mellitus with pressure callus ([place]) 07/02/2018   Acute medial meniscus tear 04/06/2018    Osteoarthritis, knee 04/06/2018   Diabetic nephropathy associated with type 2 diabetes mellitus ([place]) 03/01/2018   Trigger thumb of right hand 05/31/2017   Status post de Quervain's release surgery 03/05/2017   Hypothyroidism, postop 03/14/2016   Benign essential HTN 05/30/2015   Carpal tunnel syndrome 05/30/2015   Dyslipidemia 05/30/2015   Diabetes mellitus type 2 with carpal tunnel syndrome ([place]) 05/30/2015   History of thyroidectomy, total 05/30/2015   Microalbuminuria 05/30/2015   Morbid obesity ([place]) 05/30/2015    Past Surgical History:  Procedure Laterality Date   ABDOMINAL HYSTERECTOMY     AMPUTATION FINGER Right 1963   Index Finger after injury as a infant   BREAST BIOPSY Right 09/02/2021   Affirm bx-calcs"coil" clip-path pending   BREAST EXCISIONAL BIOPSY Right    benign   DORSAL COMPARTMENT RELEASE Left 01/18/2017   Procedure: RELEASE DORSAL COMPARTMENT (DEQUERVAIN);  Surgeon: Dereck Leep, MD;  Location: [facility] ORS;  Service: Orthopedics;  Laterality: Left;   FINE NEEDLE ASPIRATION  10/01/2013   Thyroid-Dr. Gabriel Carina   THYROIDECTOMY N/A 12/09/2015   Procedure: THYROIDECTOMY;  Surgeon: Clyde Canterbury, MD;  Location: [facility] ORS;  Service: ENT;  Laterality: N/A;    Family History  Problem Relation Age of Onset   Diabetes Mother    Diabetes  Father    Hypertension Father    Hyperlipidemia Father    Cancer Father    Diabetes Sister    Breast cancer Neg Hx     Social History   Tobacco Use   Smoking status: Never   Smokeless tobacco: Never  Substance Use Topics   Alcohol use: Yes    Alcohol/week: 0.0 standard drinks    Comment: occasionally     Current Outpatient Medications:    acetaminophen (TYLENOL) 325 MG tablet, Take 650 mg by mouth every 6 (six) hours as needed., Disp: , Rfl:    amLODipine (NORVASC) 2.5 MG tablet, Take 1 tablet (2.5 mg total) by mouth daily., Disp: 90 tablet, Rfl: 1   aspirin EC 81 MG tablet, Take 81 mg by mouth every evening. , Disp: , Rfl:     atorvastatin (LIPITOR) 40 MG tablet, Take 1 tablet (40 mg total) by mouth daily., Disp: 90 tablet, Rfl: 1   Calcium-Phosphorus-Vitamin D (CALCIUM GUMMIES PO), Take 2 tablets by mouth daily., Disp: , Rfl:    Dulaglutide (TRULICITY) 1.5 PI/9.5JO SOPN, Inject 1.5 mg into the skin once a week., Disp: 6 mL, Rfl: 1   levothyroxine (SYNTHROID) 100 MCG tablet, TAKE 1 TABLET BY MOUTH ONCE DAILY AND ONLY ONE-HALF TABLET ON SUNDAYS, Disp: 94 tablet, Rfl: 1   metFORMIN (GLUCOPHAGE) 500 MG tablet, TAKE 1 TABLET BY MOUTH TWICE DAILY WITH A MEAL, Disp: 180 tablet, Rfl: 0   Multiple Vitamin (MULTIVITAMIN WITH MINERALS) TABS tablet, Take 1 tablet by mouth daily. ALIVE MULTIVITAMIN, Disp: , Rfl:    olmesartan-hydrochlorothiazide (BENICAR HCT) 20-12.5 MG tablet, Take 1 tablet by mouth once daily, Disp: 90 tablet, Rfl: 0   vitamin B-12 (CYANOCOBALAMIN) 100 MCG tablet, Take 100 mcg by mouth daily., Disp: , Rfl:    meclizine (ANTIVERT) 12.5 MG tablet, Take 1 tablet (12.5 mg total) by mouth 3 (three) times daily as needed for dizziness. (Patient not taking: Reported on 01/04/2022), Disp: 30 tablet, Rfl: 0  No Known Allergies  I personally reviewed active problem list, medication list, allergies, family history, social history, health maintenance with the patient/caregiver today.   ROS  Constitutional: Negative for fever or weight change.  Respiratory: Negative for cough and shortness of breath.   Cardiovascular: Negative for chest pain or palpitations.  Gastrointestinal: Negative for abdominal pain, no bowel changes.  Musculoskeletal: Negative for gait problem or joint swelling.  Skin: Negative for rash.  Neurological: Negative for dizziness or headache.  No other specific complaints in a complete review of systems (except as listed in HPI above).   Objective  Vitals:   01/04/22 0807  BP: 126/80  Pulse: 83  Resp: 16  SpO2: 98%  Weight: 232 lb (105.2 kg)  Height: 5\' 8"  (1.727 m)    Body mass index  is 35.28 kg/m.  Physical Exam  Constitutional: Patient appears well-developed and well-nourished. Obese  No distress.  HEENT: head atraumatic, normocephalic, pupils equal and reactive to light, neck supple Cardiovascular: Normal rate, regular rhythm and normal heart sounds.  No murmur heard. No BLE edema. Muscular skeletal: partially amputated index finger of right hand Pulmonary/Chest: Effort normal and breath sounds normal. No respiratory distress. Abdominal: Soft.  There is no tenderness. Psychiatric: Patient has a normal mood and affect. behavior is normal. Judgment and thought content normal.   Recent Results (from the past 2160 hour(s))  POCT HgB A1C     Status: Abnormal   Collection Time: 01/04/22  8:11 AM  Result Value Ref Range  Hemoglobin A1C 6.0 (A) 4.0 - 5.6 %   HbA1c POC (<> result, manual entry)     HbA1c, POC (prediabetic range)     HbA1c, POC (controlled diabetic range)       PHQ2/9: Depression screen [facility] 2/9 01/04/2022 09/14/2021 06/16/2021 06/08/2021 02/25/2021  Decreased Interest 0 0 0 0 0  Down, Depressed, Hopeless 0 0 0 0 0  PHQ - 2 Score 0 0 0 0 0  Altered sleeping 0 - - - -  Tired, decreased energy 0 - - - -  Change in appetite 0 - - - -  Feeling bad or failure about yourself  0 - - - -  Trouble concentrating 0 - - - -  Moving slowly or fidgety/restless 0 - - - -  Suicidal thoughts 0 - - - -  PHQ-9 Score 0 - - - -  Difficult doing work/chores - - - - -  Some recent data might be hidden    phq 9 is negative   Fall Risk: Fall Risk  01/04/2022 09/14/2021 06/16/2021 06/08/2021 02/25/2021  Falls in the past year? 0 0 0 0 0  Number falls in past yr: 0 0 0 0 0  Injury with Fall? 0 0 0 0 0  Risk for fall due to : No Fall Risks No Fall Risks - - -  Follow up Falls prevention discussed Falls prevention discussed Falls evaluation completed - -      Functional Status Survey: Is the patient deaf or have difficulty hearing?: No Does the patient have difficulty  seeing, even when wearing glasses/contacts?: No Does the patient have difficulty concentrating, remembering, or making decisions?: No Does the patient have difficulty walking or climbing stairs?: No Does the patient have difficulty dressing or bathing?: No Does the patient have difficulty doing errands alone such as visiting a doctor's office or shopping?: No    Assessment & Plan  1. Type 2 diabetes mellitus with microalbuminuria, without long-term current use of insulin (HCC)  - POCT HgB A1C - Semaglutide,0.25 or 0.5MG /DOS, (OZEMPIC, 0.25 OR 0.5 MG/DOSE,) 2 MG/1.5ML SOPN; Inject 0.25 mg into the skin once a week.  Dispense: 1.5 mL; Refill: 0  2. Diabetes mellitus type 2 in obese (HCC)  - metFORMIN (GLUCOPHAGE) 500 MG tablet; Take 1 tablet (500 mg total) by mouth 2 (two) times daily with a meal.  Dispense: 180 tablet; Refill: 0  3. Benign essential HTN  - olmesartan-hydrochlorothiazide (BENICAR HCT) 20-12.5 MG tablet; Take 1 tablet by mouth daily.  Dispense: 90 tablet; Refill: 1  4. History of thyroidectomy, total  - levothyroxine (SYNTHROID) 100 MCG tablet; TAKE 1 TABLET BY MOUTH ONCE DAILY AND ONLY ONE-HALF TABLET ON SUNDAYS  Dispense: 94 tablet; Refill: 1  5. Amputated finger, sequela ([place])   6. Morbid obesity ([place])  Discussed with the patient the risk posed by an increased BMI. Discussed importance of portion control, calorie counting and at least 150 minutes of physical activity weekly. Avoid sweet beverages and drink more water. Eat at least 6 servings of fruit and vegetables daily

## 2022-01-04 ENCOUNTER — Ambulatory Visit (INDEPENDENT_AMBULATORY_CARE_PROVIDER_SITE_OTHER): Payer: BC Managed Care – PPO | Admitting: Family Medicine

## 2022-01-04 ENCOUNTER — Other Ambulatory Visit: Payer: Self-pay

## 2022-01-04 ENCOUNTER — Encounter: Payer: Self-pay | Admitting: Family Medicine

## 2022-01-04 VITALS — BP 126/80 | HR 83 | Resp 16 | Ht 68.0 in | Wt 232.0 lb

## 2022-01-04 DIAGNOSIS — E890 Postprocedural hypothyroidism: Secondary | ICD-10-CM

## 2022-01-04 DIAGNOSIS — R809 Proteinuria, unspecified: Secondary | ICD-10-CM | POA: Diagnosis not present

## 2022-01-04 DIAGNOSIS — E1129 Type 2 diabetes mellitus with other diabetic kidney complication: Secondary | ICD-10-CM

## 2022-01-04 DIAGNOSIS — I10 Essential (primary) hypertension: Secondary | ICD-10-CM | POA: Diagnosis not present

## 2022-01-04 DIAGNOSIS — E1169 Type 2 diabetes mellitus with other specified complication: Secondary | ICD-10-CM

## 2022-01-04 DIAGNOSIS — S68119S Complete traumatic metacarpophalangeal amputation of unspecified finger, sequela: Secondary | ICD-10-CM

## 2022-01-04 DIAGNOSIS — E669 Obesity, unspecified: Secondary | ICD-10-CM

## 2022-01-04 LAB — POCT GLYCOSYLATED HEMOGLOBIN (HGB A1C): Hemoglobin A1C: 6 % — AB (ref 4.0–5.6)

## 2022-01-04 MED ORDER — LEVOTHYROXINE SODIUM 100 MCG PO TABS: ORAL_TABLET | ORAL | 1 refills | Status: DC

## 2022-01-04 MED ORDER — METFORMIN HCL 500 MG PO TABS: 500.0000 mg | ORAL_TABLET | Freq: Two times a day (BID) | ORAL | 0 refills | Status: DC

## 2022-01-04 MED ORDER — OLMESARTAN MEDOXOMIL-HCTZ 20-12.5 MG PO TABS: 1.0000 | ORAL_TABLET | Freq: Every day | ORAL | 1 refills | Status: DC

## 2022-01-04 MED ORDER — OZEMPIC (0.25 OR 0.5 MG/DOSE) 2 MG/1.5ML ~~LOC~~ SOPN: 0.2500 mg | PEN_INJECTOR | SUBCUTANEOUS | 0 refills | Status: DC

## 2022-03-08 NOTE — Patient Instructions (Incomplete)

## 2022-03-08 NOTE — Progress Notes (Deleted)
Name: Samantha Copeland   MRN: [id number]    DOB: 08/31/1961   Date:03/08/2022 ? ?     Progress Note ? ?Subjective ? ?Chief Complaint ? ?Annual Exam ? ?HPI ? ?Patient presents for annual CPE. ? ?Diet: *** ?Exercise: ***  ? ?Flowsheet Row Video Visit from 06/16/2021 in [facility]  ?AUDIT-C Score 0  ? ?  ? ?Depression: Phq 9 is  {Desc; negative/positive:13464} ? ?  01/04/2022  ?  8:08 AM 09/14/2021  ? 10:11 AM 06/16/2021  ?  8:03 AM 06/08/2021  ?  8:51 AM 02/25/2021  ?  8:42 AM  ?Depression screen PHQ 2/9  ?Decreased Interest 0 0 0 0 0  ?Down, Depressed, Hopeless 0 0 0 0 0  ?PHQ - 2 Score 0 0 0 0 0  ?Altered sleeping 0      ?Tired, decreased energy 0      ?Change in appetite 0      ?Feeling bad or failure about yourself  0      ?Trouble concentrating 0      ?Moving slowly or fidgety/restless 0      ?Suicidal thoughts 0      ?PHQ-9 Score 0      ? ?Hypertension: ?BP Readings from Last 3 Encounters:  ?01/04/22 126/80  ?09/14/21 120/80  ?06/21/21 136/78  ? ?Obesity: ?Wt Readings from Last 3 Encounters:  ?01/04/22 232 lb (105.2 kg)  ?09/14/21 233 lb (105.7 kg)  ?06/08/21 233 lb (105.7 kg)  ? ?BMI Readings from Last 3 Encounters:  ?01/04/22 35.28 kg/m?  ?09/14/21 35.43 kg/m?  ?06/08/21 35.43 kg/m?  ?  ? ?Vaccines:  ? ?HPV: N/A ?Tdap: up to date ?Shingrix: up to date ?Pneumonia: up to date ?Flu: up to date ?COVID-19: 04/01/20 ? ? ?Hep C Screening: 07/22/13 ?STD testing and prevention (HIV/chl/gon/syphilis): 08/24/20 ?Intimate partner violence: negative ?Sexual History : ?Menstrual History/LMP/Abnormal Bleeding:  ?Discussed importance of follow up if any post-menopausal bleeding: {Response; yes/no/na:63} ?Incontinence Symptoms: {yes no:314532} ? ?Breast cancer:  ?- Last Mammogram: Ordered 09/02/21 ?- BRCA gene screening: N/A ? ?Osteoporosis Prevention : Discussed high calcium and vitamin D supplementation, weight bearing exercises ?Bone density :{Response; yes/no/na:63} ? ?Cervical cancer screening: N/A ? ?Skin  cancer: Discussed monitoring for atypical lesions  ?Colorectal cancer: 05/08/12   ?Lung cancer:  Low Dose CT Chest recommended if Age 50-80 years, 20 pack-year currently smoking OR have quit w/in 15years. Patient does not qualify.   ?ECG: 01/10/17 ? ?Advanced Care Planning: A voluntary discussion about advance care planning including the explanation and discussion of advance directives.  Discussed health care proxy and Living will, and the patient was able to identify a health care proxy as ***.  Patient does not  ? ?Lipids: ?Lab Results  ?Component Value Date  ? CHOL 145 09/14/2021  ? CHOL 140 08/24/2020  ? CHOL 145 01/20/2020  ? ?Lab Results  ?Component Value Date  ? HDL 95 09/14/2021  ? HDL 88 08/24/2020  ? HDL 88 01/20/2020  ? ?Lab Results  ?Component Value Date  ? LDLCALC 37 09/14/2021  ? LDLCALC 40 08/24/2020  ? LDLCALC 46 01/20/2020  ? ?Lab Results  ?Component Value Date  ? TRIG 58 09/14/2021  ? TRIG 50 08/24/2020  ? TRIG 37 01/20/2020  ? ?Lab Results  ?Component Value Date  ? CHOLHDL 1.5 09/14/2021  ? CHOLHDL 1.6 08/24/2020  ? CHOLHDL 1.6 01/20/2020  ? ?No results found for: LDLDIRECT ? ?Glucose: ?Glucose, Bld  ?Date Value Ref Range Status  ?  09/14/2021 92 65 - 139 mg/dL Final  ?  Comment:  ?  . ?       Non-fasting reference interval ?. ?  ?08/24/2020 88 65 - 99 mg/dL Final  ?  Comment:  ?  . ?           Fasting reference interval ?. ?  ?01/20/2020 97 65 - 99 mg/dL Final  ?  Comment:  ?  . ?           Fasting reference interval ?. ?  ? ?Glucose-Capillary  ?Date Value Ref Range Status  ?01/18/2017 103 (H) 65 - 99 mg/dL Final  ?01/18/2017 109 (H) 65 - 99 mg/dL Final  ?12/10/2015 68 65 - 99 mg/dL Final  ? ? ?Patient Active Problem List  ? Diagnosis Date Noted  ? Amputated finger, sequela (HCC) 06/12/2019  ? Type 2 diabetes mellitus with pressure callus (HCC) 07/02/2018  ? Acute medial meniscus tear 04/06/2018  ? Osteoarthritis, knee 04/06/2018  ? Diabetic nephropathy associated with type 2 diabetes mellitus (HCC)  03/01/2018  ? Trigger thumb of right hand 05/31/2017  ? Status post de Quervain's release surgery 03/05/2017  ? Hypothyroidism, postop 03/14/2016  ? Benign essential HTN 05/30/2015  ? Carpal tunnel syndrome 05/30/2015  ? Dyslipidemia 05/30/2015  ? 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) 05/30/2015  ? History of thyroidectomy, total 05/30/2015  ? Microalbuminuria 05/30/2015  ? Morbid obesity (HCC) 05/30/2015  ? ? ?Past Surgical History:  ?Procedure Laterality Date  ? ABDOMINAL HYSTERECTOMY    ? AMPUTATION FINGER Right 1963  ? Index Finger after injury as a infant  ? BREAST BIOPSY Right 09/02/2021  ? Affirm bx-calcs"coil" clip-path pending  ? BREAST EXCISIONAL BIOPSY Right   ? benign  ? DORSAL COMPARTMENT RELEASE Left 01/18/2017  ? Procedure: RELEASE DORSAL COMPARTMENT (DEQUERVAIN);  Surgeon: James P Hooten, MD;  Location: [facility] ORS;  Service: Orthopedics;  Laterality: Left;  ? FINE NEEDLE ASPIRATION  10/01/2013  ? Thyroid-Dr. Solum  ? THYROIDECTOMY N/A 12/09/2015  ? Procedure: THYROIDECTOMY;  Surgeon: Paul Bennett, MD;  Location: [facility] ORS;  Service: ENT;  Laterality: N/A;  ? ? ?Family History  ?Problem Relation Age of Onset  ? Diabetes Mother   ? Diabetes Father   ? Hypertension Father   ? Hyperlipidemia Father   ? Cancer Father   ? Diabetes Sister   ? Breast cancer Neg Hx   ? ? ?Social History  ? ?Socioeconomic History  ? Marital status: Married  ?  Spouse name: Dwight  ? Number of children: 3  ? Years of education: Not on file  ? Highest education level: 12th grade  ?Occupational History  ? Occupation: stocker  ?  Employer: [employer]  ?  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  ?Tobacco Use  ? Smoking status: Never  ? Smokeless tobacco: Never  ?Vaping Use  ? Vaping Use: Never used  ?Substance and Sexual Activity  ? Alcohol use: Yes  ?  Alcohol/week: 0.0 standard drinks  ?  Comment: occasionally  ? Drug use: No  ? Sexual activity: Yes  ?  Partners: Male  ?  Birth control/protection: Surgical  ?Other Topics Concern  ? Not  on file  ?Social History Narrative  ? Lives with husband  ? Three grown children   ? One grandson   ? ?Social Determinants of Health  ? ?Financial Resource Strain: Not on file  ?Food Insecurity: Not on file  ?Transportation Needs: Not on file  ?Physical Activity: Not on file  ?  Stress: Not on file  ?Social Connections: Not on file  ?Intimate Partner Violence: Not on file  ? ? ? ?Current Outpatient Medications:  ?  acetaminophen (TYLENOL) 325 MG tablet, Take 650 mg by mouth every 6 (six) hours as needed., Disp: , Rfl:  ?  amLODipine (NORVASC) 2.5 MG tablet, Take 1 tablet (2.5 mg total) by mouth daily., Disp: 90 tablet, Rfl: 1 ?  aspirin EC 81 MG tablet, Take 81 mg by mouth every evening. , Disp: , Rfl:  ?  atorvastatin (LIPITOR) 40 MG tablet, Take 1 tablet (40 mg total) by mouth daily., Disp: 90 tablet, Rfl: 1 ?  Calcium-Phosphorus-Vitamin D (CALCIUM GUMMIES PO), Take 2 tablets by mouth daily., Disp: , Rfl:  ?  levothyroxine (SYNTHROID) 100 MCG tablet, TAKE 1 TABLET BY MOUTH ONCE DAILY AND ONLY ONE-HALF TABLET ON SUNDAYS, Disp: 94 tablet, Rfl: 1 ?  metFORMIN (GLUCOPHAGE) 500 MG tablet, Take 1 tablet (500 mg total) by mouth 2 (two) times daily with a meal., Disp: 180 tablet, Rfl: 0 ?  Multiple Vitamin (MULTIVITAMIN WITH MINERALS) TABS tablet, Take 1 tablet by mouth daily. ALIVE MULTIVITAMIN, Disp: , Rfl:  ?  olmesartan-hydrochlorothiazide (BENICAR HCT) 20-12.5 MG tablet, Take 1 tablet by mouth daily., Disp: 90 tablet, Rfl: 1 ?  Semaglutide,0.25 or 0.5MG/DOS, (OZEMPIC, 0.25 OR 0.5 MG/DOSE,) 2 MG/1.5ML SOPN, Inject 0.25 mg into the skin once a week., Disp: 1.5 mL, Rfl: 0 ?  vitamin B-12 (CYANOCOBALAMIN) 100 MCG tablet, Take 100 mcg by mouth daily., Disp: , Rfl:  ? ?No Known Allergies ? ? ?ROS ? ?*** ? ?Objective ? ?There were no vitals filed for this visit. ? ?There is no height or weight on file to calculate BMI. ? ?Physical Exam ?*** ? ?Recent Results (from the past 2160 hour(s))  ?POCT HgB A1C     Status: Abnormal  ?  Collection Time: 01/04/22  8:11 AM  ?Result Value Ref Range  ? Hemoglobin A1C 6.0 (A) 4.0 - 5.6 %  ? HbA1c POC (<> result, manual entry)    ? HbA1c, POC (prediabetic range)    ? HbA1c, POC (controlled dia

## 2022-03-09 ENCOUNTER — Encounter: Payer: BC Managed Care – PPO | Admitting: Family Medicine

## 2022-03-09 DIAGNOSIS — Z0000 Encounter for general adult medical examination without abnormal findings: Secondary | ICD-10-CM

## 2022-03-30 ENCOUNTER — Other Ambulatory Visit: Payer: Self-pay | Admitting: Family Medicine

## 2022-03-30 DIAGNOSIS — E669 Obesity, unspecified: Secondary | ICD-10-CM

## 2022-05-04 NOTE — Progress Notes (Unsigned)
Name: Samantha Copeland   MRN: [id number]    DOB: 1961-08-23   Date:05/05/2022       Progress Note  Subjective  Chief Complaint  Follow Up  HPI  DMII with nephropathy  She is currently on Trulicity and metformin twice daily  6.5% to 6.8%,  6.7% , 6.8 % , 6.4 % , 6.1 %, 6.2 % , 6.1 % today it is down to 6 % we stopped GLP-1 agonist due to cost and today A1C is 6.6%    Symptoms of carpal tunnel resolved with surgery in 2018, last urine micro was normal ( she is on ARB), last GFR also normal. She also has obesity , she gained 5 lbs since last visit, we will add Wilder Glade and stop Metfomrin, it will be Xigduo 09/999, discussed possible side effects  HTN: she has been taking Benicar 20/125 mg and low dose Norvasc since earlier 2022,  bp has been well controlled, no chest pain , dizziness or palpitation. Continue current regiment     Hyperlipidemia: she is on Atorvastatin daily. LDL down to 37 .   Morbid obesity: BMI  she was on Trulicity, even though still working long hours, weight was stable at 232 lbs. We stopped medication in Feb due to cost and gained only 5 lbs, trying to eat healthy   Hypothyroidism: used to see Dr. Richardson Landry had total thyroidectomy in December 2016, doing well, on Synthroid 100 mcg daily and half on Sundays. She  has been released by Dr. Gabriel Carina.  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 Chronic and stable.   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 Doing well   Patient Active Problem List   Diagnosis Date Noted   Amputated finger, sequela ([place]) 06/12/2019   Type 2 diabetes mellitus with pressure callus ([place]) 07/02/2018   Acute medial meniscus tear 04/06/2018   Osteoarthritis, knee 04/06/2018   Diabetic nephropathy associated with type 2 diabetes mellitus ([place]) 03/01/2018   Trigger thumb of  right hand 05/31/2017   Status post de Quervain's release surgery 03/05/2017   Hypothyroidism, postop 03/14/2016   Benign essential HTN 05/30/2015   Carpal tunnel syndrome 05/30/2015   Dyslipidemia 05/30/2015   Diabetes mellitus type 2 with carpal tunnel syndrome ([place]) 05/30/2015   History of thyroidectomy, total 05/30/2015   Microalbuminuria 05/30/2015   Morbid obesity ([place]) 05/30/2015    Past Surgical History:  Procedure Laterality Date   ABDOMINAL HYSTERECTOMY     AMPUTATION FINGER Right 1963   Index Finger after injury as a infant   BREAST BIOPSY Right 09/02/2021   Affirm bx-calcs"coil" clip-path pending   BREAST EXCISIONAL BIOPSY Right    benign   DORSAL COMPARTMENT RELEASE Left 01/18/2017   Procedure: RELEASE DORSAL COMPARTMENT (DE[place]ERVAIN);  Surgeon: Dereck Leep, MD;  Location: [facility] ORS;  Service: Orthopedics;  Laterality: Left;   FINE NEEDLE ASPIRATION  10/01/2013   Thyroid-Dr. Gabriel Carina   THYROIDECTOMY N/A 12/09/2015   Procedure: THYROIDECTOMY;  Surgeon: Clyde Canterbury, MD;  Location: [facility] OR[facility];  Service: ENT;  Laterality: N/A;    Family History  Problem Relation Age of Onset   Diabetes Mother    Diabetes Father    Hypertension Father    Hyperlipidemia Father    Cancer Father    Diabetes Sister  Breast cancer Neg Hx     Social History   Tobacco Use   Smoking status: Never   Smokeless tobacco: Never  Substance Use Topics   Alcohol use: Yes    Alcohol/week: 0.0 standard drinks    Comment: occasionally     Current Outpatient Medications:    acetaminophen (TYLENOL) 325 MG tablet, Take 650 mg by mouth every 6 (six) hours as needed., Disp: , Rfl:    amLODipine (NORVASC) 2.5 MG tablet, Take 1 tablet (2.5 mg total) by mouth daily., Disp: 90 tablet, Rfl: 1   aspirin EC 81 MG tablet, Take 81 mg by mouth every evening. , Disp: , Rfl:    atorvastatin (LIPITOR) 40 MG tablet, Take 1 tablet (40 mg total) by mouth daily., Disp: 90 tablet, Rfl: 1    Calcium-Phosphorus-Vitamin D (CALCIUM GUMMIES PO), Take 2 tablets by mouth daily., Disp: , Rfl:    levothyroxine (SYNTHROID) 100 MCG tablet, TAKE 1 TABLET BY MOUTH ONCE DAILY AND ONLY ONE-HALF TABLET ON SUNDAYS, Disp: 94 tablet, Rfl: 1   metFORMIN (GLUCOPHAGE) 500 MG tablet, TAKE 1 TABLET BY MOUTH TWICE DAILY WITH A MEAL, Disp: 180 tablet, Rfl: 0   Multiple Vitamin (MULTIVITAMIN WITH MINERALS) TABS tablet, Take 1 tablet by mouth daily. ALIVE MULTIVITAMIN, Disp: , Rfl:    olmesartan-hydrochlorothiazide (BENICAR HCT) 20-12.5 MG tablet, Take 1 tablet by mouth daily., Disp: 90 tablet, Rfl: 1   Semaglutide,0.25 or 0.'5MG'$ /DOS, (OZEMPIC, 0.25 OR 0.5 MG/DOSE,) 2 MG/1.5ML SOPN, Inject 0.25 mg into the skin once a week., Disp: 1.5 mL, Rfl: 0   vitamin B-12 (CYANOCOBALAMIN) 100 MCG tablet, Take 100 mcg by mouth daily., Disp: , Rfl:   No Known Allergies  I personally reviewed active problem list, medication list, allergies, family history, social history, health maintenance with the patient/caregiver today.   ROS  Constitutional: Negative for fever or weight change.  Respiratory: Negative for cough and shortness of breath.   Cardiovascular: Negative for chest pain or palpitations.  Gastrointestinal: Negative for abdominal pain, no bowel changes.  Musculoskeletal: Negative for gait problem or joint swelling.  Skin: Negative for rash.  Neurological: Negative for dizziness or headache.  No other specific complaints in a complete review of systems (except as listed in HPI above).   Objective  Vitals:   05/05/22 0855  BP: 122/74  Pulse: 69  Resp: 16  SpO2: 97%  Weight: 237 lb (107.5 kg)  Height: '5\' 8"'$  (1.727 m)    Body mass index is 36.04 kg/m.  Physical Exam  Constitutional: Patient appears well-developed and well-nourished. Obese No distress.  HEENT: head atraumatic, normocephalic, pupils equal and reactive to light, neck supple Cardiovascular: Normal rate, regular rhythm and normal heart  sounds.  No murmur heard. No BLE edema. Pulmonary/Chest: Effort normal and breath sounds normal. No respiratory distress. Abdominal: Soft.  There is no tenderness. Psychiatric: Patient has a normal mood and affect. behavior is normal. Judgment and thought content normal.   Recent Results (from the past 2160 hour(s))  POCT HgB A1C     Status: Abnormal   Collection Time: 05/05/22  9:04 AM  Result Value Ref Range   Hemoglobin A1C 6.6 (A) 4.0 - 5.6 %   HbA1c POC (<> result, manual entry)     HbA1c, POC (prediabetic range)     HbA1c, POC (controlled diabetic range)       PHQ2/9:    05/05/2022    8:55 AM 01/04/2022    8:08 AM 09/14/2021   10:11 AM 06/16/2021  8:03 AM 06/08/2021    8:51 AM  Depression screen PHQ 2/9  Decreased Interest 0 0 0 0 0  Down, Depressed, Hopeless 0 0 0 0 0  PHQ - 2 Score 0 0 0 0 0  Altered sleeping 0 0     Tired, decreased energy 0 0     Change in appetite 0 0     Feeling bad or failure about yourself  0 0     Trouble concentrating 0 0     Moving slowly or fidgety/restless 0 0     Suicidal thoughts 0 0     PHQ-9 Score 0 0       phq 9 is negative   Fall Risk:    05/05/2022    8:54 AM 01/04/2022    8:08 AM 09/14/2021   10:11 AM 06/16/2021    8:03 AM 06/08/2021    8:51 AM  Fall Risk   Falls in the past year? 0 0 0 0 0  Number falls in past yr: 0 0 0 0 0  Injury with Fall? 0 0 0 0 0  Risk for fall due to : No Fall Risks No Fall Risks No Fall Risks    Follow up Falls prevention discussed Falls prevention discussed Falls prevention discussed Falls evaluation completed       Functional Status Survey: Is the patient deaf or have difficulty hearing?: No Does the patient have difficulty seeing, even when wearing glasses/contacts?: No Does the patient have difficulty concentrating, remembering, or making decisions?: No Does the patient have difficulty walking or climbing stairs?: No Does the patient have difficulty dressing or bathing?: No Does the  patient have difficulty doing errands alone such as visiting a doctor's office or shopping?: No    Assessment & Plan  Problem List Items Addressed This Visit     Benign essential HTN    Well controlled        Relevant Medications   amLODipine (NORVASC) 2.5 MG tablet   atorvastatin (LIPITOR) 40 MG tablet   olmesartan-hydrochlorothiazide (BENICAR HCT) 20-12.5 MG tablet   Morbid obesity ([facility])    BMI above 35 with co-morbidities , off GLP-1 agonist due to cost        Relevant Medications   Dapagliflozin-metFORMIN HCl ER (XIGDUO XR) 09-999 MG TB24   Hypothyroidism, postop    Last TSH is at goal        Relevant Medications   levothyroxine (SYNTHROID) 100 MCG tablet   Diabetic nephropathy associated with type 2 diabetes mellitus ([place]) - Primary    Used to have microalbuminuria but doing well on ARB We will add SGL-2 agonist today Discussed possible side effects       Relevant Medications   atorvastatin (LIPITOR) 40 MG tablet   Dapagliflozin-metFORMIN HCl ER (XIGDUO XR) 09-999 MG TB24   olmesartan-hydrochlorothiazide (BENICAR HCT) 20-12.5 MG tablet   Other Relevant Orders   POCT HgB A1C (Completed)   Amputated finger, sequela (HCC)    Chronic and stable.        Dyslipidemia   Relevant Medications   atorvastatin (LIPITOR) 40 MG tablet

## 2022-05-05 ENCOUNTER — Ambulatory Visit (INDEPENDENT_AMBULATORY_CARE_PROVIDER_SITE_OTHER): Payer: BC Managed Care – PPO | Admitting: Family Medicine

## 2022-05-05 ENCOUNTER — Encounter: Payer: Self-pay | Admitting: Family Medicine

## 2022-05-05 VITALS — BP 122/74 | HR 69 | Resp 16 | Ht 68.0 in | Wt 237.0 lb

## 2022-05-05 DIAGNOSIS — E785 Hyperlipidemia, unspecified: Secondary | ICD-10-CM

## 2022-05-05 DIAGNOSIS — S68119S Complete traumatic metacarpophalangeal amputation of unspecified finger, sequela: Secondary | ICD-10-CM

## 2022-05-05 DIAGNOSIS — I10 Essential (primary) hypertension: Secondary | ICD-10-CM | POA: Diagnosis not present

## 2022-05-05 DIAGNOSIS — E890 Postprocedural hypothyroidism: Secondary | ICD-10-CM

## 2022-05-05 DIAGNOSIS — E1129 Type 2 diabetes mellitus with other diabetic kidney complication: Secondary | ICD-10-CM

## 2022-05-05 DIAGNOSIS — E1121 Type 2 diabetes mellitus with diabetic nephropathy: Secondary | ICD-10-CM | POA: Diagnosis not present

## 2022-05-05 LAB — POCT GLYCOSYLATED HEMOGLOBIN (HGB A1C): Hemoglobin A1C: 6.6 % — AB (ref 4.0–5.6)

## 2022-05-05 MED ORDER — OLMESARTAN MEDOXOMIL-HCTZ 20-12.5 MG PO TABS: 1.0000 | ORAL_TABLET | Freq: Every day | ORAL | 1 refills | Status: DC

## 2022-05-05 MED ORDER — AMLODIPINE BESYLATE 2.5 MG PO TABS: 2.5000 mg | ORAL_TABLET | Freq: Every day | ORAL | 1 refills | Status: DC

## 2022-05-05 MED ORDER — LEVOTHYROXINE SODIUM 100 MCG PO TABS: ORAL_TABLET | ORAL | 1 refills | Status: DC

## 2022-05-05 MED ORDER — XIGDUO XR 10-1000 MG PO TB24: 1.0000 | ORAL_TABLET | Freq: Every day | ORAL | 1 refills | Status: DC

## 2022-05-05 MED ORDER — ATORVASTATIN CALCIUM 40 MG PO TABS: 40.0000 mg | ORAL_TABLET | Freq: Every day | ORAL | 1 refills | Status: DC

## 2022-05-05 NOTE — Assessment & Plan Note (Signed)
Well controlled 

## 2022-05-05 NOTE — Assessment & Plan Note (Signed)
Chronic and stable.   

## 2022-05-05 NOTE — Assessment & Plan Note (Signed)
BMI above 35 with co-morbidities , off GLP-1 agonist due to cost

## 2022-05-05 NOTE — Assessment & Plan Note (Signed)
Last TSH is at goal

## 2022-05-05 NOTE — Assessment & Plan Note (Signed)
Used to have microalbuminuria but doing well on ARB We will add SGL-2 agonist today Discussed possible side effects

## 2022-05-16 LAB — HM DIABETES EYE EXAM

## 2022-06-16 NOTE — Patient Instructions (Signed)
Preventive Care 40-61 Years Old, Female Preventive care refers to lifestyle choices and visits with your health care provider that can promote health and wellness. Preventive care visits are also called wellness exams. What can I expect for my preventive care visit? Counseling Your health care provider may ask you questions about your: Medical history, including: Past medical problems. Family medical history. Pregnancy history. Current health, including: Menstrual cycle. Method of birth control. Emotional well-being. Home life and relationship well-being. Sexual activity and sexual health. Lifestyle, including: Alcohol, nicotine or tobacco, and drug use. Access to firearms. Diet, exercise, and sleep habits. Work and work environment. Sunscreen use. Safety issues such as seatbelt and bike helmet use. Physical exam Your health care provider will check your: Height and weight. These may be used to calculate your BMI (body mass index). BMI is a measurement that tells if you are at a healthy weight. Waist circumference. This measures the distance around your waistline. This measurement also tells if you are at a healthy weight and may help predict your risk of certain diseases, such as type 2 diabetes and high blood pressure. Heart rate and blood pressure. Body temperature. Skin for abnormal spots. What immunizations do I need?  Vaccines are usually given at various ages, according to a schedule. Your health care provider will recommend vaccines for you based on your age, medical history, and lifestyle or other factors, such as travel or where you work. What tests do I need? Screening Your health care provider may recommend screening tests for certain conditions. This may include: Lipid and cholesterol levels. Diabetes screening. This is done by checking your blood sugar (glucose) after you have not eaten for a while (fasting). Pelvic exam and Pap test. Hepatitis B test. Hepatitis C  test. HIV (human immunodeficiency virus) test. STI (sexually transmitted infection) testing, if you are at risk. Lung cancer screening. Colorectal cancer screening. Mammogram. Talk with your health care provider about when you should start having regular mammograms. This may depend on whether you have a family history of breast cancer. BRCA-related cancer screening. This may be done if you have a family history of breast, ovarian, tubal, or peritoneal cancers. Bone density scan. This is done to screen for osteoporosis. Talk with your health care provider about your test results, treatment options, and if necessary, the need for more tests. Follow these instructions at home: Eating and drinking  Eat a diet that includes fresh fruits and vegetables, whole grains, lean protein, and low-fat dairy products. Take vitamin and mineral supplements as recommended by your health care provider. Do not drink alcohol if: Your health care provider tells you not to drink. You are pregnant, may be pregnant, or are planning to become pregnant. If you drink alcohol: Limit how much you have to 0-1 drink a day. Know how much alcohol is in your drink. In the [place] one drink equals one 12 oz bottle of beer (355 mL), one 5 oz glass of wine (148 mL), or one 1 oz glass of hard liquor (44 mL). Lifestyle Brush your teeth every morning and night with fluoride toothpaste. Floss one time each day. Exercise for at least 30 minutes 5 or more days each week. Do not use any products that contain nicotine or tobacco. These products include cigarettes, chewing tobacco, and vaping devices, such as e-cigarettes. If you need help quitting, ask your health care provider. Do not use drugs. If you are sexually active, practice safe sex. Use a condom or other form of protection to   prevent STIs. If you do not wish to become pregnant, use a form of birth control. If you plan to become pregnant, see your health care provider for a  prepregnancy visit. Take aspirin only as told by your health care provider. Make sure that you understand how much to take and what form to take. Work with your health care provider to find out whether it is safe and beneficial for you to take aspirin daily. Find healthy ways to manage stress, such as: Meditation, yoga, or listening to music. Journaling. Talking to a trusted person. Spending time with friends and family. Minimize exposure to UV radiation to reduce your risk of skin cancer. Safety Always wear your seat belt while driving or riding in a vehicle. Do not drive: If you have been drinking alcohol. Do not ride with someone who has been drinking. When you are tired or distracted. While texting. If you have been using any mind-altering substances or drugs. Wear a helmet and other protective equipment during sports activities. If you have firearms in your house, make sure you follow all gun safety procedures. Seek help if you have been physically or sexually abused. What's next? Visit your health care provider once a year for an annual wellness visit. Ask your health care provider how often you should have your eyes and teeth checked. Stay up to date on all vaccines. This information is not intended to replace advice given to you by your health care provider. Make sure you discuss any questions you have with your health care provider. Document Revised: 05/26/2021 Document Reviewed: 05/26/2021 Elsevier Patient Education  [place].

## 2022-06-16 NOTE — Progress Notes (Signed)
Name: Samantha Copeland   MRN: [id number]    DOB: 28-Nov-1961   Date:06/17/2022       Progress Note  Subjective  Chief Complaint  Annual Exam  HPI  Patient presents for annual CPE.  Diet: discussed high calcium diet  Exercise: discussed 150 minutes    Flowsheet Row Video Visit from 06/16/2021 in [facility]  AUDIT-C Score 0      Depression: Phq 9 is  negative    06/17/2022    1:17 PM 05/05/2022    8:55 AM 01/04/2022    8:08 AM 09/14/2021   10:11 AM 06/16/2021    8:03 AM  Depression screen PHQ 2/9  Decreased Interest 0 0 0 0 0  Down, Depressed, Hopeless 0 0 0 0 0  PHQ - 2 Score 0 0 0 0 0  Altered sleeping 0 0 0    Tired, decreased energy 0 0 0    Change in appetite 0 0 0    Feeling bad or failure about yourself  0 0 0    Trouble concentrating 0 0 0    Moving slowly or fidgety/restless 0 0 0    Suicidal thoughts 0 0 0    PHQ-9 Score 0 0 0     Hypertension: BP Readings from Last 3 Encounters:  06/17/22 128/78  05/05/22 122/74  01/04/22 126/80   Obesity: Wt Readings from Last 3 Encounters:  06/17/22 236 lb 11.2 oz (107.4 kg)  05/05/22 237 lb (107.5 kg)  01/04/22 232 lb (105.2 kg)   BMI Readings from Last 3 Encounters:  06/17/22 35.47 kg/m  05/05/22 36.04 kg/m  01/04/22 35.28 kg/m     Vaccines:   HPV: N/A Tdap: up to date Shingrix: up to date Pneumonia: N/A Flu: up to date COVID-7: 2021   Hep C Screening: 07/22/13 STD testing and prevention (HIV/chl/gon/syphilis): 08/24/20 Intimate partner violence: negative screen  Sexual History : sexually active same partner for years, no pain or dryness  Menstrual History/LMP/Abnormal Bleeding: s/p hysterectomy for DUB  Discussed importance of follow up if any post-menopausal bleeding: not applicable  Incontinence Symptoms: negative for symptoms   Breast cancer:  - Last Mammogram: Ordered 09/02/21 - BRCA gene screening: N/A  Osteoporosis Prevention : Discussed high calcium and vitamin D  supplementation, weight bearing exercises Bone density :not applicable   Cervical cancer screening: N/A s/p hysterectomy   Skin cancer: Discussed monitoring for atypical lesions  Colorectal cancer: 05/08/12   Lung cancer:  Low Dose CT Chest recommended if Age 20-80 years, 20 pack-year currently smoking OR have quit w/in 15years. Patient does not qualify for screen   ECG: 01/10/17  Advanced Care Planning: A voluntary discussion about advance care planning including the explanation and discussion of advance directives.  Discussed health care proxy and Living will, and the patient was able to identify a health care proxy as husband .  Patient does not have a living will and power of attorney of health care   Lipids: Lab Results  Component Value Date   CHOL 145 09/14/2021   CHOL 140 08/24/2020   CHOL 145 01/20/2020   Lab Results  Component Value Date   HDL 95 09/14/2021   HDL 88 08/24/2020   HDL 88 01/20/2020   Lab Results  Component Value Date   LDLCALC 37 09/14/2021   LDLCALC 40 08/24/2020   LDLCALC 46 01/20/2020   Lab Results  Component Value Date   TRIG 58 09/14/2021   TRIG 50 08/24/2020  TRIG 37 01/20/2020   Lab Results  Component Value Date   CHOLHDL 1.5 09/14/2021   CHOLHDL 1.6 08/24/2020   CHOLHDL 1.6 01/20/2020   No results found for: "LDLDIRECT"  Glucose: Glucose, Bld  Date Value Ref Range Status  09/14/2021 92 65 - 139 mg/dL Final    Comment:    .        Non-fasting reference interval .   08/24/2020 88 65 - 99 mg/dL Final    Comment:    .            Fasting reference interval .   01/20/2020 97 65 - 99 mg/dL Final    Comment:    .            Fasting reference interval .    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 ([place]) 05/30/2015   History of thyroidectomy, total 05/30/2015   Microalbuminuria 05/30/2015   Morbid obesity ([place]) 05/30/2015    Past Surgical History:  Procedure Laterality Date   ABDOMINAL HYSTERECTOMY     AMPUTATION FINGER Right 1963   Index Finger after injury as a infant   BREAST BIOPSY Right 09/02/2021   Affirm bx-calcs"coil" clip-path pending   BREAST EXCISIONAL BIOPSY Right    benign   DORSAL COMPARTMENT RELEASE Left 01/18/2017   Procedure: RELEASE DORSAL COMPARTMENT (DEQUERVAIN);  Surgeon: Dereck Leep, MD;  Location: [facility] ORS;  Service: Orthopedics;  Laterality: Left;   FINE NEEDLE ASPIRATION  10/01/2013   Thyroid-Dr. Gabriel Carina   THYROIDECTOMY N/A 12/09/2015   Procedure: THYROIDECTOMY;  Surgeon: Clyde Canterbury, MD;  Location: [facility] ORS;  Service: ENT;  Laterality: N/A;    Family History  Problem Relation Age of Onset   Diabetes Mother    Diabetes Father    Hypertension Father    Hyperlipidemia Father    Cancer Father    Diabetes Sister    Breast cancer Neg Hx     Social History   Socioeconomic History   Marital status: Married    Spouse name: Orpah Greek   Number of children: 3   Years of education: Not on file   Highest education level: 12th grade  Occupational History   Occupation: Glass blower/designer: [id number]    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  Tobacco Use   Smoking status: Never   Smokeless tobacco: Never  Vaping Use   Vaping Use: Never used  Substance and Sexual Activity   Alcohol use: Yes    Alcohol/week: 0.0 standard drinks of alcohol    Comment: occasionally   Drug use: No   Sexual activity: Yes    Partners: Male    Birth control/protection:  Surgical  Other Topics Concern   Not on file  Social History Narrative   Lives with husband   Three grown children    One grandson    Social Determinants of Health   Financial Resource Strain: Low Risk  (06/17/2022)   Overall  Financial Resource Strain (CARDIA)    Difficulty of Paying Living Expenses: Not hard at all  Food Insecurity: No Food Insecurity (06/17/2022)   Hunger Vital Sign    Worried About Running Out of Food in the Last Year: Never true    Ran Out of Food in the Last Year: Never true  Transportation Needs: No Transportation Needs (06/17/2022)   PRAPARE - Hydrologist (Medical): No    Lack of Transportation (Non-Medical): No  Physical Activity: Insufficiently Active (06/17/2022)   Exercise Vital Sign    Days of Exercise per Week: 1 day    Minutes of Exercise per Session: 30 min  Stress: No Stress Concern Present (06/17/2022)   [place]    Feeling of Stress : Not at all  Social Connections: Moderately Integrated (06/17/2022)   Social Connection and Isolation Panel [NHANES]    Frequency of Communication with Friends and Family: More than three times a week    Frequency of Social Gatherings with Friends and Family: More than three times a week    Attends Religious Services: More than 4 times per year    Active Member of [employer] or Organizations: No    Attends Archivist Meetings: Never    Marital Status: Married  Human resources officer Violence: Not At Risk (06/17/2022)   Humiliation, Afraid, Rape, and Kick questionnaire    Fear of Current or Ex-Partner: No    Emotionally Abused: No    Physically Abused: No    Sexually Abused: No     Current Outpatient Medications:    acetaminophen (TYLENOL) 325 MG tablet, Take 650 mg by mouth every 6 (six) hours as needed., Disp: , Rfl:    amLODipine (NORVASC) 2.5 MG tablet, Take 1 tablet (2.5 mg total) by mouth daily., Disp: 90 tablet, Rfl:  1   aspirin EC 81 MG tablet, Take 81 mg by mouth every evening. , Disp: , Rfl:    atorvastatin (LIPITOR) 40 MG tablet, Take 1 tablet (40 mg total) by mouth daily., Disp: 90 tablet, Rfl: 1   Calcium-Phosphorus-Vitamin D (CALCIUM GUMMIES PO), Take 2 tablets by mouth daily., Disp: , Rfl:    Dapagliflozin-metFORMIN HCl ER (XIGDUO XR) 09-999 MG TB24, Take 1 tablet by mouth daily., Disp: 90 tablet, Rfl: 1   levothyroxine (SYNTHROID) 100 MCG tablet, TAKE 1 TABLET BY MOUTH ONCE DAILY AND ONLY ONE-HALF TABLET ON SUNDAYS, Disp: 94 tablet, Rfl: 1   Multiple Vitamin (MULTIVITAMIN WITH MINERALS) TABS tablet, Take 1 tablet by mouth daily. ALIVE MULTIVITAMIN, Disp: , Rfl:    olmesartan-hydrochlorothiazide (BENICAR HCT) 20-12.5 MG tablet, Take 1 tablet by mouth daily., Disp: 90 tablet, Rfl: 1   vitamin B-12 (CYANOCOBALAMIN) 100 MCG tablet, Take 100 mcg by mouth daily., Disp: , Rfl:   No Known Allergies   ROS  Constitutional: Negative for fever or weight change.  Respiratory: Negative for cough and shortness of breath.   Cardiovascular: Negative for chest pain or palpitations.  Gastrointestinal: Negative for abdominal pain, no bowel changes.  Musculoskeletal: Negative for gait problem or joint swelling.  Skin: Negative for rash.  Neurological: Negative for dizziness or headache.  No other specific complaints in a complete review of systems (except as listed in HPI above).   Objective  Vitals:   06/17/22 1313  BP: 128/78  Pulse: 72  Resp: 16  Temp: 97.8 F (36.6 C)  TempSrc: Oral  SpO2: 98%  Weight: 236 lb 11.2 oz (107.4  kg)  Height: 5' 8.5" (1.74 m)    Body mass index is 35.47 kg/m.  Physical Exam  Constitutional: Patient appears well-developed and well-nourished. No distress.  HENT: Head: Normocephalic and atraumatic. Ears: B TMs ok, no erythema or effusion; Nose: Nose normal. Mouth/Throat: Oropharynx is clear and moist. No oropharyngeal exudate.  Eyes: Conjunctivae and EOM are normal.  Pupils are equal, round, and reactive to light. No scleral icterus.  Neck: Normal range of motion. Neck supple. No JVD present. No thyromegaly present.  Cardiovascular: Normal rate, regular rhythm and normal heart sounds.  No murmur heard. No BLE edema. Pulmonary/Chest: Effort normal and breath sounds normal. No respiratory distress. Abdominal: Soft. Bowel sounds are normal, no distension. There is no tenderness. no masses Breast: no lumps or masses, no nipple discharge or rashes FEMALE GENITALIA:  Not done  RECTAL: not done  Musculoskeletal: Normal range of motion, no joint effusions. No gross deformities Neurological: he is alert and oriented to person, place, and time. No cranial nerve deficit. Coordination, balance, strength, speech and gait are normal.  Skin: Skin is warm and dry. No rash noted. No erythema.  Psychiatric: Patient has a normal mood and affect. behavior is normal. Judgment and thought content normal.   Recent Results (from the past 2160 hour(s))  POCT HgB A1C     Status: Abnormal   Collection Time: 05/05/22  9:04 AM  Result Value Ref Range   Hemoglobin A1C 6.6 (A) 4.0 - 5.6 %   HbA1c POC (<> result, manual entry)     HbA1c, POC (prediabetic range)     HbA1c, POC (controlled diabetic range)    HM DIABETES EYE EXAM     Status: None   Collection Time: 05/16/22 12:00 AM  Result Value Ref Range   HM Diabetic Eye Exam No Retinopathy No Retinopathy    Comment: [place]     Fall Risk:    06/17/2022    1:17 PM 05/05/2022    8:54 AM 01/04/2022    8:08 AM 09/14/2021   10:11 AM 06/16/2021    8:03 AM  Fall Risk   Falls in the past year? 0 0 0 0 0  Number falls in past yr:  0 0 0 0  Injury with Fall?  0 0 0 0  Risk for fall due to : No Fall Risks No Fall Risks No Fall Risks No Fall Risks   Follow up Falls prevention discussed;Education provided;Falls evaluation completed Falls prevention discussed Falls prevention discussed Falls prevention discussed Falls  evaluation completed     Functional Status Survey: Is the patient deaf or have difficulty hearing?: No Does the patient have difficulty seeing, even when wearing glasses/contacts?: No Does the patient have difficulty concentrating, remembering, or making decisions?: No Does the patient have difficulty walking or climbing stairs?: No Does the patient have difficulty dressing or bathing?: No Does the patient have difficulty doing errands alone such as visiting a doctor's office or shopping?: No   Assessment & Plan  1. Well adult exam  - Ambulatory referral to Gastroenterology  2. Colon cancer screening  - Ambulatory referral to Gastroenterology  3. Breast cancer screening by mammogram  - MM 3D SCREEN BREAST BILATERAL; Future    -USPSTF grade A and B recommendations reviewed with patient; age-appropriate recommendations, preventive care, screening tests, etc discussed and encouraged; healthy living encouraged; see AVS for patient education given to patient -Discussed importance of 150 minutes of physical activity weekly, eat two servings of fish weekly, eat  one serving of tree nuts ( cashews, pistachios, pecans, almonds.Marland Kitchen) every other day, eat 6 servings of fruit/vegetables daily and drink plenty of water and avoid sweet beverages.   -Reviewed Health Maintenance: Yes.

## 2022-06-17 ENCOUNTER — Ambulatory Visit (INDEPENDENT_AMBULATORY_CARE_PROVIDER_SITE_OTHER): Payer: BC Managed Care – PPO | Admitting: Family Medicine

## 2022-06-17 ENCOUNTER — Encounter: Payer: Self-pay | Admitting: Family Medicine

## 2022-06-17 VITALS — BP 128/78 | HR 72 | Temp 97.8°F | Resp 16 | Ht 68.5 in | Wt 236.7 lb

## 2022-06-17 DIAGNOSIS — Z1211 Encounter for screening for malignant neoplasm of colon: Secondary | ICD-10-CM | POA: Diagnosis not present

## 2022-06-17 DIAGNOSIS — Z0000 Encounter for general adult medical examination without abnormal findings: Secondary | ICD-10-CM | POA: Diagnosis not present

## 2022-06-17 DIAGNOSIS — Z1231 Encounter for screening mammogram for malignant neoplasm of breast: Secondary | ICD-10-CM | POA: Diagnosis not present

## 2022-06-21 ENCOUNTER — Other Ambulatory Visit: Payer: Self-pay

## 2022-06-21 ENCOUNTER — Telehealth: Payer: Self-pay

## 2022-06-21 DIAGNOSIS — Z1211 Encounter for screening for malignant neoplasm of colon: Secondary | ICD-10-CM

## 2022-06-21 MED ORDER — NA SULFATE-K SULFATE-MG SULF 17.5-3.13-1.6 GM/177ML PO SOLN: 1.0000 | Freq: Once | ORAL | 0 refills | Status: AC

## 2022-06-21 NOTE — Telephone Encounter (Signed)
Gastroenterology Pre-Procedure Review  Request Date: 07/11/22 Requesting Physician: Dr. Vicente Males  PATIENT REVIEW QUESTIONS: The patient responded to the following health history questions as indicated:    1. Are you having any GI issues? no 2. Do you have a personal history of Polyps? no 3. Do you have a family history of Colon Cancer or Polyps? no 4. Diabetes Mellitus? yes (type 2) 5. Joint replacements in the past 12 months?no 6. Major health problems in the past 3 months?no 7. Any artificial heart valves, MVP, or defibrillator?no    MEDICATIONS & ALLERGIES:    Patient reports the following regarding taking any anticoagulation/antiplatelet therapy:   Plavix, Coumadin, Eliquis, Xarelto, Lovenox, Pradaxa, Brilinta, or Effient? no Aspirin? no  Patient confirms/reports the following medications:  Current Outpatient Medications  Medication Sig Dispense Refill   acetaminophen (TYLENOL) 325 MG tablet Take 650 mg by mouth every 6 (six) hours as needed.     amLODipine (NORVASC) 2.5 MG tablet Take 1 tablet (2.5 mg total) by mouth daily. 90 tablet 1   aspirin EC 81 MG tablet Take 81 mg by mouth every evening.      atorvastatin (LIPITOR) 40 MG tablet Take 1 tablet (40 mg total) by mouth daily. 90 tablet 1   Calcium-Phosphorus-Vitamin D (CALCIUM GUMMIES PO) Take 2 tablets by mouth daily.     Dapagliflozin-metFORMIN HCl ER (XIGDUO XR) 09-999 MG TB24 Take 1 tablet by mouth daily. 90 tablet 1   levothyroxine (SYNTHROID) 100 MCG tablet TAKE 1 TABLET BY MOUTH ONCE DAILY AND ONLY ONE-HALF TABLET ON SUNDAYS 94 tablet 1   Multiple Vitamin (MULTIVITAMIN WITH MINERALS) TABS tablet Take 1 tablet by mouth daily. ALIVE MULTIVITAMIN     olmesartan-hydrochlorothiazide (BENICAR HCT) 20-12.5 MG tablet Take 1 tablet by mouth daily. 90 tablet 1   vitamin B-12 (CYANOCOBALAMIN) 100 MCG tablet Take 100 mcg by mouth daily.     No current facility-administered medications for this visit.    Patient confirms/reports  the following allergies:  No Known Allergies  No orders of the defined types were placed in this encounter.   AUTHORIZATION INFORMATION Primary Insurance: 1D#: Group #:  Secondary Insurance: 1D#: Group #:  SCHEDULE INFORMATION: Date: 07/11/22 Time: Location: [place]

## 2022-07-11 ENCOUNTER — Ambulatory Visit: Payer: BC Managed Care – PPO | Admitting: Certified Registered"

## 2022-07-11 ENCOUNTER — Encounter
Admission: RE | Disposition: A | Discharge status: Home / Self Care | Payer: Self-pay | Source: Home / Self Care | Attending: Gastroenterology

## 2022-07-11 ENCOUNTER — Ambulatory Visit
Admission: RE | Admit: 2022-07-11 | Discharge: 2022-07-11 | Disposition: A | Discharge status: Home / Self Care | Payer: BC Managed Care – PPO | Attending: Gastroenterology | Admitting: Gastroenterology

## 2022-07-11 DIAGNOSIS — Z7984 Long term (current) use of oral hypoglycemic drugs: Secondary | ICD-10-CM | POA: Insufficient documentation

## 2022-07-11 DIAGNOSIS — E1140 Type 2 diabetes mellitus with diabetic neuropathy, unspecified: Secondary | ICD-10-CM | POA: Diagnosis not present

## 2022-07-11 DIAGNOSIS — E669 Obesity, unspecified: Secondary | ICD-10-CM | POA: Insufficient documentation

## 2022-07-11 DIAGNOSIS — Z8249 Family history of ischemic heart disease and other diseases of the circulatory system: Secondary | ICD-10-CM | POA: Diagnosis not present

## 2022-07-11 DIAGNOSIS — D122 Benign neoplasm of ascending colon: Secondary | ICD-10-CM | POA: Diagnosis not present

## 2022-07-11 DIAGNOSIS — D126 Benign neoplasm of colon, unspecified: Secondary | ICD-10-CM | POA: Diagnosis not present

## 2022-07-11 DIAGNOSIS — E785 Hyperlipidemia, unspecified: Secondary | ICD-10-CM | POA: Insufficient documentation

## 2022-07-11 DIAGNOSIS — Z833 Family history of diabetes mellitus: Secondary | ICD-10-CM | POA: Diagnosis not present

## 2022-07-11 DIAGNOSIS — Z1211 Encounter for screening for malignant neoplasm of colon: Secondary | ICD-10-CM | POA: Insufficient documentation

## 2022-07-11 DIAGNOSIS — E890 Postprocedural hypothyroidism: Secondary | ICD-10-CM | POA: Insufficient documentation

## 2022-07-11 DIAGNOSIS — Z6835 Body mass index (BMI) 35.0-35.9, adult: Secondary | ICD-10-CM | POA: Diagnosis not present

## 2022-07-11 DIAGNOSIS — I10 Essential (primary) hypertension: Secondary | ICD-10-CM | POA: Insufficient documentation

## 2022-07-11 DIAGNOSIS — Z8349 Family history of other endocrine, nutritional and metabolic diseases: Secondary | ICD-10-CM | POA: Insufficient documentation

## 2022-07-11 DIAGNOSIS — K635 Polyp of colon: Secondary | ICD-10-CM | POA: Diagnosis not present

## 2022-07-11 HISTORY — PX: COLONOSCOPY WITH PROPOFOL: SHX5780

## 2022-07-11 LAB — GLUCOSE, CAPILLARY: Glucose-Capillary: 137 mg/dL — ABNORMAL HIGH (ref 70–99)

## 2022-07-11 SURGERY — COLONOSCOPY WITH PROPOFOL
Anesthesia: General

## 2022-07-11 MED ORDER — SODIUM CHLORIDE 0.9 % IV SOLN: INTRAVENOUS | Status: DC

## 2022-07-11 MED ORDER — GLYCOPYRROLATE 0.2 MG/ML IJ SOLN
INTRAMUSCULAR | Status: DC | PRN
  Administered 2022-07-11: .2 mg via INTRAVENOUS

## 2022-07-11 MED ORDER — PROPOFOL 500 MG/50ML IV EMUL
INTRAVENOUS | Status: DC | PRN
  Administered 2022-07-11: 165 ug/kg/min via INTRAVENOUS

## 2022-07-11 MED ORDER — PROPOFOL 10 MG/ML IV BOLUS
INTRAVENOUS | Status: DC | PRN
  Administered 2022-07-11: 60 mg via INTRAVENOUS

## 2022-07-11 NOTE — Anesthesia Procedure Notes (Signed)
Procedure Name: General with mask airway Date/Time: 07/11/2022 9:27 AM  Performed by: Kelton Pillar, CRNAPre-anesthesia Checklist: Patient identified, Emergency Drugs available, Suction available and Patient being monitored Patient Re-evaluated:Patient Re-evaluated prior to induction Oxygen Delivery Method: Simple face mask Induction Type: IV induction Placement Confirmation: positive ETCO2, CO2 detector and breath sounds checked- equal and bilateral Dental Injury: Teeth and Oropharynx as per pre-operative assessment

## 2022-07-11 NOTE — Op Note (Signed)
[facility] Gastroenterology Patient Name: Samantha Copeland Procedure Date: 07/11/2022 9:09 AM MRN: [id number] Account #: [id number] Date of Birth: 10/15/1961 Admit Type: Outpatient Age: 61 Room: [facility] ENDO ROOM 1 Gender: Female Note Status: Finalized Instrument Name: Park Meo [id number] Procedure:             Colonoscopy Indications:           Screening for colorectal malignant neoplasm Providers:             Jonathon Bellows MD, MD Referring MD:          Bethena Roys. Sowles, MD (Referring MD) Medicines:             Monitored Anesthesia Care Complications:         No immediate complications. Procedure:             Pre-Anesthesia Assessment:                        - Prior to the procedure, a History and Physical was                         performed, and patient medications, allergies and                         sensitivities were reviewed. The patient's tolerance                         of previous anesthesia was reviewed.                        - The risks and benefits of the procedure and the                         sedation options and risks were discussed with the                         patient. All questions were answered and informed                         consent was obtained.                        - ASA Grade Assessment: II - A patient with mild                         systemic disease.                        After obtaining informed consent, the colonoscope was                         passed under direct vision. Throughout the procedure,                         the patient's blood pressure, pulse, and oxygen                         saturations were monitored continuously. The                         Colonoscope was  introduced through the anus and                         advanced to the the cecum, identified by the                         appendiceal orifice. The colonoscopy was performed                         with ease. The patient tolerated the procedure  well.                         The quality of the bowel preparation was excellent. Findings:      The perianal and digital rectal examinations were normal.      A 5 mm polyp was found in the ascending colon. The polyp was sessile.       The polyp was removed with a cold snare. Resection and retrieval were       complete.      The exam was otherwise without abnormality on direct and retroflexion       views. Impression:            - One 5 mm polyp in the ascending colon, removed with                         a cold snare. Resected and retrieved.                        - The examination was otherwise normal on direct and                         retroflexion views. Recommendation:        - Discharge patient to home (with escort).                        - Resume previous diet.                        - Continue present medications.                        - Await pathology results.                        - Repeat colonoscopy for surveillance based on                         pathology results. Procedure Code(s):     --- Professional ---                        [phone], Colonoscopy, flexible; with removal of                         tumor(s), polyp(s), or other lesion(s) by snare                         technique Diagnosis Code(s):     --- Professional ---  Z12.11, Encounter for screening for malignant neoplasm                         of colon                        K63.5, Polyp of colon CPT copyright 2019 American Medical Association. All rights reserved. The codes documented in this report are preliminary and upon coder review may  be revised to meet current compliance requirements. Jonathon Bellows, MD Jonathon Bellows MD, MD 07/11/2022 9:38:27 AM This report has been signed electronically. Number of Addenda: 0 Note Initiated On: 07/11/2022 9:09 AM Scope Withdrawal Time: 0 hours 12 minutes 44 seconds  Total Procedure Duration: 0 hours 16 minutes 41 seconds  Estimated Blood Loss:   Estimated blood loss: none.      [facility]

## 2022-07-11 NOTE — Transfer of Care (Signed)
Immediate Anesthesia Transfer of Care Note  Patient: Samantha Copeland  Procedure(s) Performed: COLONOSCOPY WITH PROPOFOL  Patient Location: Endoscopy Unit  Anesthesia Type:General  Level of Consciousness: awake, drowsy and patient cooperative  Airway & Oxygen Therapy: Patient Spontanous Breathing and Patient connected to face mask oxygen  Post-op Assessment: Report given to RN and Post -op Vital signs reviewed and stable  Post vital signs: Reviewed and stable  Last Vitals:  Vitals Value Taken Time  BP 117/70 07/11/22 0941  Temp 36.6 C 07/11/22 0941  Pulse 64 07/11/22 0943  Resp 25 07/11/22 0943  SpO2 100 % 07/11/22 0943  Vitals shown include unvalidated device data.  Last Pain:  Vitals:   07/11/22 0941  TempSrc: Temporal  PainSc: Asleep         Complications: No notable events documented.

## 2022-07-11 NOTE — Anesthesia Postprocedure Evaluation (Signed)
Anesthesia Post Note  Patient: SENNA LAPE  Procedure(s) Performed: COLONOSCOPY WITH PROPOFOL  Patient location during evaluation: Endoscopy Anesthesia Type: General Level of consciousness: awake and alert Pain management: pain level controlled Vital Signs Assessment: post-procedure vital signs reviewed and stable Respiratory status: spontaneous breathing, nonlabored ventilation, respiratory function stable and patient connected to nasal cannula oxygen Cardiovascular status: blood pressure returned to baseline and stable Postop Assessment: no apparent nausea or vomiting Anesthetic complications: no   No notable events documented.   Last Vitals:  Vitals:   07/11/22 0950 07/11/22 1000  BP: 129/83 (!) 141/76  Pulse: 60 (!) 54  Resp: 18 15  Temp:    SpO2: 100% 100%    Last Pain:  Vitals:   07/11/22 1000  TempSrc:   PainSc: 0-No pain                 Precious Haws Daylah Sayavong

## 2022-07-11 NOTE — H&P (Signed)
Jonathon Bellows, MD [address] [place], [place], [place], [address] [address], [place], [place], [place], [address] Phone: [phone]  Fax: [phone]  Primary Care Physician:  Steele Sizer, MD   Pre-Procedure History & Physical: HPI:  Samantha Copeland is a 61 y.o. female is here for an colonoscopy.   Past Medical History:  Diagnosis Date   Acute medial meniscus tear 04/06/2018   MRI April 2019   Arthritis    Bilateral carpal tunnel syndrome    Diabetes mellitus without complication (HCC)    Ganglion of right wrist    Hot flashes    Hyperlipidemia    Hypertension    Hypothyroidism    Microalbuminuria    Multinodular goiter    Neuropathy    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[place])    Obesity    Osteoarthritis, knee 04/06/2018   MRI April 2019   Snoring     Past Surgical History:  Procedure Laterality Date   ABDOMINAL HYSTERECTOMY     AMPUTATION FINGER Right 1963   Index Finger after injury as a infant   BREAST BIOPSY Right 09/02/2021   Affirm bx-calcs"coil" clip-path pending   BREAST EXCISIONAL BIOPSY Right    benign   DORSAL COMPARTMENT RELEASE Left 01/18/2017   Procedure: RELEASE DORSAL COMPARTMENT (DEQUERVAIN);  Surgeon: Dereck Leep, MD;  Location: [facility] OR[facility];  Service: Orthopedics;  Laterality: Left;   FINE NEEDLE ASPIRATION  10/01/2013   Thyroid-Dr. Gabriel Carina   THYROIDECTOMY N/A 12/09/2015   Procedure: THYROIDECTOMY;  Surgeon: Clyde Canterbury, MD;  Location: [facility];  Service: ENT;  Laterality: N/A;    Prior to Admission medications   Medication Sig Start Date End Date Taking? Authorizing Provider  amLODipine (NORVASC) 2.5 MG tablet Take 1 tablet (2.5 mg total) by mouth daily. 05/05/22  Yes Steele Sizer, MD  aspirin EC 81 MG tablet Take 81 mg by mouth every evening.  02/15/08  Yes [provider]  atorvastatin (LIPITOR) 40 MG tablet Take 1 tablet (40 mg total) by mouth daily. 05/05/22  Yes Sowles, Drue Stager, MD   Dapagliflozin-metFORMIN HCl ER (XIGDUO XR) 09-999 MG TB24 Take 1 tablet by mouth daily. 05/05/22  Yes Sowles, Drue Stager, MD  levothyroxine (SYNTHROID) 100 MCG tablet TAKE 1 TABLET BY MOUTH ONCE DAILY AND ONLY ONE-HALF TABLET ON SUNDAYS 05/05/22  Yes Sowles, Drue Stager, MD  olmesartan-hydrochlorothiazide (BENICAR HCT) 20-12.5 MG tablet Take 1 tablet by mouth daily. 05/05/22  Yes Sowles, Drue Stager, MD  vitamin B-12 (CYANOCOBALAMIN) 100 MCG tablet Take 100 mcg by mouth daily.   Yes [provider]  acetaminophen (TYLENOL) 325 MG tablet Take 650 mg by mouth every 6 (six) hours as needed.    [provider]  Calcium-Phosphorus-Vitamin D (CALCIUM GUMMIES PO) Take 2 tablets by mouth daily.    [provider]  Multiple Vitamin (MULTIVITAMIN WITH MINERALS) TABS tablet Take 1 tablet by mouth daily. 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  Tobacco Use   Smoking status: Never   Smokeless tobacco: Never  Vaping Use   Vaping Use: Never used  Substance and Sexual Activity   Alcohol use: Yes    Alcohol/week: 0.0 standard drinks of alcohol    Comment: occasionally   Drug use: No   Sexual activity: Yes    Partners: Male    Birth control/protection: Surgical  Other Topics Concern   Not on file  Social History Narrative   Lives with husband   Three grown children    One grandson    Social Determinants of Health   Financial Resource Strain:  Low Risk  (06/17/2022)   Overall Financial Resource Strain (CARDIA)    Difficulty of Paying Living Expenses: Not hard at all  Food Insecurity: No Food Insecurity (06/17/2022)   Hunger Vital Sign    Worried About Running Out of Food in the Last Year: Never true    [place] in the Last Year: Never true  Transportation Needs: No Transportation Needs (06/17/2022)   PRAPARE - Hydrologist (Medical): No    Lack of Transportation (Non-Medical): No  Physical Activity: Insufficiently Active (06/17/2022)   Exercise Vital Sign    Days of Exercise per Week: 1 day    Minutes of Exercise per Session: 30 min  Stress: No Stress Concern Present (06/17/2022)   [place]    Feeling of Stress : Not at all  Social Connections: Moderately Integrated (06/17/2022)   Social Connection and Isolation Panel [NHANES]    Frequency of Communication with Friends and Family: More than three times a week    Frequency of Social Gatherings with Friends and Family: More than three times a week    Attends Religious Services: More than 4 times per year    Active Member of [employer] or Organizations: No    Attends Archivist Meetings: Never    Marital Status: Married  Human resources officer Violence: Not At Risk (06/17/2022)   Humiliation, Afraid, Rape, and Kick questionnaire    Fear of Current or Ex-Partner: No    Emotionally Abused: No    Physically Abused: No    Sexually Abused: No    Review of Systems: See HPI, otherwise negative ROS  Physical Exam: BP (!) 146/83   Pulse 60   Temp (!) 96.3 F (35.7 C) (Tympanic)   Resp 20   Ht 5' 8.5" (1.74 m)   Wt 106.1 kg   SpO2 100%   BMI 35.06 kg/m  General:   Alert,  pleasant and cooperative in NAD Head:  Normocephalic and atraumatic. Neck:  Supple; no masses or thyromegaly. Lungs:  Clear throughout to auscultation, normal respiratory effort.    Heart:  +S1, +S2, Regular rate  and rhythm, No edema. Abdomen:  Soft, nontender and nondistended. Normal bowel sounds, without guarding, and without rebound.   Neurologic:  Alert and  oriented x4;  grossly normal neurologically.  Impression/Plan: Samantha Copeland is here for an colonoscopy to be performed for Screening colonoscopy average risk   Risks, benefits, limitations, and alternatives regarding  colonoscopy have been reviewed with the patient.  Questions have been answered.  All parties agreeable.   Jonathon Bellows, MD  07/11/2022, 9:08 AM

## 2022-07-11 NOTE — Anesthesia Preprocedure Evaluation (Signed)
Anesthesia Evaluation  Patient identified by MRN, date of birth, ID band Patient awake    Reviewed: Allergy & Precautions, NPO status , Patient's Chart, lab work & pertinent test results  History of Anesthesia Complications Negative for: history of anesthetic complications  Airway Mallampati: III  TM Distance: >3 FB Neck ROM: full    Dental  (+) Chipped   Pulmonary neg pulmonary ROS, neg shortness of breath,    Pulmonary exam normal        Cardiovascular Exercise Tolerance: Good hypertension, (-) anginaNormal cardiovascular exam     Neuro/Psych  Neuromuscular disease negative psych ROS   GI/Hepatic negative GI ROS, Neg liver ROS, neg GERD  ,  Endo/Other  diabetes, Type 2Hypothyroidism   Renal/GU Renal disease  negative genitourinary   Musculoskeletal   Abdominal   Peds  Hematology negative hematology ROS (+)   Anesthesia Other Findings Past Medical History: 04/06/2018: Acute medial meniscus tear     Comment:  MRI April 2019 No date: Arthritis No date: Bilateral carpal tunnel syndrome No date: Diabetes mellitus without complication (HCC) No date: Ganglion of right wrist No date: Hot flashes No date: Hyperlipidemia No date: Hypertension No date: Hypothyroidism No date: Microalbuminuria No date: Multinodular goiter No date: Neuropathy No date: 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[place]) No date: Obesity 04/06/2018: Osteoarthritis, knee     Comment:  MRI April 2019 No date: Snoring  Past Surgical History: No date: ABDOMINAL HYSTERECTOMY 1963: AMPUTATION FINGER; Right     Comment:  Index Finger after injury as a infant 09/02/2021: BREAST BIOPSY; Right     Comment:  Affirm bx-calcs"coil" clip-path pending No date: BREAST EXCISIONAL BIOPSY; Right     Comment:  benign 01/18/2017: DORSAL COMPARTMENT RELEASE; Left     Comment:  Procedure: RELEASE DORSAL COMPARTMENT (DEQUERVAIN);                 Surgeon: Dereck Leep, MD;  Location: [facility];                Service: Orthopedics;  Laterality: Left; 10/01/2013: FINE NEEDLE ASPIRATION     Comment:  Thyroid-Dr. Gabriel Carina 12/09/2015: THYROIDECTOMY; N/A     Comment:  Procedure: THYROIDECTOMY;  Surgeon: Clyde Canterbury, MD;                Location: [facility];  Service: ENT;  Laterality: N/A;  BMI    Body Mass Index: 35.06 kg/m      Reproductive/Obstetrics negative OB ROS                             Anesthesia Physical Anesthesia Plan  ASA: 3  Anesthesia Plan: General   Post-op Pain Management:    Induction: Intravenous  PONV Risk Score and Plan: Propofol infusion and TIVA  Airway Management Planned: Natural Airway and Nasal Cannula  Additional Equipment:   Intra-op Plan:   Post-operative Plan:   Informed Consent: I have reviewed the patients History and Physical, chart, labs and discussed the procedure including the risks, benefits and alternatives for the proposed anesthesia with the patient or authorized representative who has indicated his/her understanding and acceptance.     Dental Advisory Given  Plan Discussed with: Anesthesiologist, CRNA and Surgeon  Anesthesia Plan Comments: (Patient consented for risks of anesthesia including but not limited to:  - adverse reactions to medications - risk of airway placement if required - damage to eyes, teeth, lips or other oral mucosa - nerve  damage due to positioning  - sore throat or hoarseness - Damage to heart, brain, nerves, lungs, other parts of body or loss of life  Patient voiced understanding.)        Anesthesia Quick Evaluation

## 2022-07-12 ENCOUNTER — Encounter: Payer: Self-pay | Admitting: Gastroenterology

## 2022-07-12 LAB — SURGICAL PATHOLOGY

## 2022-07-27 ENCOUNTER — Other Ambulatory Visit: Payer: Self-pay

## 2022-07-27 DIAGNOSIS — E890 Postprocedural hypothyroidism: Secondary | ICD-10-CM

## 2022-07-27 MED ORDER — LEVOTHYROXINE SODIUM 100 MCG PO TABS: ORAL_TABLET | ORAL | 0 refills | Status: DC

## 2022-08-12 NOTE — Progress Notes (Unsigned)
Name: Samantha Copeland   MRN: [id number]    DOB: [birth year]   Date:08/16/2022       Progress Note  Subjective  Chief Complaint  Follow Up  HPI  DMII with nephropathy  She is currently on Trulicity and metformin twice daily  6.5% to 6.8%,  6.7% , 6.8 % , 6.4 % , 6.1 %, 6.2 % , 6.1 % 6 % we stopped GLP-1 agonist due to cost  A1C went up to 6.6% and today it is 6.9 %     Symptoms of carpal tunnel resolved with surgery in 2018, last urine micro normalized ( she is on ARB), last GFR also normal. She also has obesity , she is now on  Xigduo 09/999, tolerating it well but since A1C went up we will adjust dose   HTN: she has been taking Benicar 20/125 mg and low dose Norvasc since earlier 2022,  bp has been well controlled, no chest pain , dizziness or palpitation. Unchanged     Hyperlipidemia: she is on Atorvastatin daily. LDL down to 37 .   Morbid obesity: BMI over 35 with co-morbidities such as DM, HTN and dyslipidemia,  she was on Trulicity, even though still working long hours, weight was stable at 232 lbs. We stopped Trulicity in Feb 7096  due to cost and weight is now stable around 236 lbs    Hypothyroidism: used to see Dr. Richardson Landry had total thyroidectomy in December 2016, doing well, on Synthroid 100 mcg daily and half on Sundays. She  has been released by Dr. Gabriel Carina.  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 Chronic and stable. 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 Unchanged   Patient Active Problem List   Diagnosis Date Noted   Amputated finger, sequela ([place]) 06/12/2019   Osteoarthritis, knee 04/06/2018   Diabetic nephropathy associated with type 2 diabetes mellitus ([place]) 03/01/2018   Status post de Quervain's release surgery 03/05/2017   Hypothyroidism, postop 03/14/2016    Benign essential HTN 05/30/2015   Dyslipidemia 05/30/2015   Diabetes mellitus type 2 with carpal tunnel syndrome ([place]) 05/30/2015   History of thyroidectomy, total 05/30/2015   Microalbuminuria 05/30/2015   Morbid obesity ([place]) 05/30/2015    Past Surgical History:  Procedure Laterality Date   ABDOMINAL HYSTERECTOMY     AMPUTATION FINGER Right 1963   Index Finger after injury as a infant   BREAST BIOPSY Right 09/02/2021   Affirm bx-calcs"coil" clip-path pending   BREAST EXCISIONAL BIOPSY Right    benign   COLONOSCOPY WITH PROPOFOL N/A 07/11/2022   Procedure: COLONOSCOPY WITH PROPOFOL;  Surgeon: Jonathon Bellows, MD;  Location: [facility];  Service: Gastroenterology;  Laterality: N/A;   DORSAL COMPARTMENT RELEASE Left 01/18/2017   Procedure: RELEASE DORSAL COMPARTMENT (DEQUERVAIN);  Surgeon: Dereck Leep, MD;  Location: [facility];  Service: Orthopedics;  Laterality: Left;   FINE NEEDLE ASPIRATION  10/01/2013   Thyroid-Dr. Gabriel Carina   THYROIDECTOMY N/A 12/09/2015   Procedure: THYROIDECTOMY;  Surgeon: Clyde Canterbury, MD;  Location: [facility];  Service: ENT;  Laterality: N/A;    Family History  Problem Relation Age of Onset   Diabetes Mother    Diabetes Father    Hypertension Father    Hyperlipidemia Father  Cancer Father    Diabetes Sister    Breast cancer Neg Hx     Social History   Tobacco Use   Smoking status: Never   Smokeless tobacco: Never  Substance Use Topics   Alcohol use: Yes    Alcohol/week: 0.0 standard drinks of alcohol    Comment: occasionally     Current Outpatient Medications:    acetaminophen (TYLENOL) 325 MG tablet, Take 650 mg by mouth every 6 (six) hours as needed., Disp: , Rfl:    amLODipine (NORVASC) 2.5 MG tablet, Take 1 tablet (2.5 mg total) by mouth daily., Disp: 90 tablet, Rfl: 1   aspirin EC 81 MG tablet, Take 81 mg by mouth every evening. , Disp: , Rfl:    atorvastatin (LIPITOR) 40 MG tablet, Take 1 tablet (40 mg total) by mouth daily., Disp:  90 tablet, Rfl: 1   Calcium-Phosphorus-Vitamin D (CALCIUM GUMMIES PO), Take 2 tablets by mouth daily., Disp: , Rfl:    Dapagliflozin-metFORMIN HCl ER (XIGDUO XR) 09-999 MG TB24, Take 1 tablet by mouth daily., Disp: 90 tablet, Rfl: 1   levothyroxine (SYNTHROID) 100 MCG tablet, TAKE 1 TABLET BY MOUTH ONCE DAILY AND ONLY ONE-HALF TABLET ON SUNDAYS, Disp: 94 tablet, Rfl: 0   Multiple Vitamin (MULTIVITAMIN WITH MINERALS) TABS tablet, Take 1 tablet by mouth daily. ALIVE MULTIVITAMIN, Disp: , Rfl:    olmesartan-hydrochlorothiazide (BENICAR HCT) 20-12.5 MG tablet, Take 1 tablet by mouth daily., Disp: 90 tablet, Rfl: 1   vitamin B-12 (CYANOCOBALAMIN) 100 MCG tablet, Take 100 mcg by mouth daily., Disp: , Rfl:   No Known Allergies  I personally reviewed active problem list, medication list, allergies, family history, social history, health maintenance with the patient/caregiver today.   ROS  Constitutional: Negative for fever or weight change.  Respiratory: Negative for cough and shortness of breath.   Cardiovascular: Negative for chest pain or palpitations.  Gastrointestinal: Negative for abdominal pain, no bowel changes.  Musculoskeletal: Negative for gait problem or joint swelling.  Skin: Negative for rash.  Neurological: Negative for dizziness or headache.  No other specific complaints in a complete review of systems (except as listed in HPI above).   Objective  Vitals:   08/16/22 0847  BP: 126/72  Pulse: 67  Resp: 16  SpO2: 99%  Weight: 236 lb (107 kg)  Height: 5' 7.5" (1.715 m)    Body mass index is 36.42 kg/m.  Physical Exam  Constitutional: Patient appears well-developed and well-nourished. Obese  No distress.  HEENT: head atraumatic, normocephalic, pupils equal and reactive to ligh, neck supple, throat within normal limits Cardiovascular: Normal rate, regular rhythm and normal heart sounds.  No murmur heard. No BLE edema. Pulmonary/Chest: Effort normal and breath sounds  normal. No respiratory distress. Abdominal: Soft.  There is no tenderness. Psychiatric: Patient has a normal mood and affect. behavior is normal. Judgment and thought content normal.   Recent Results (from the past 2160 hour(s))  Glucose, capillary     Status: Abnormal   Collection Time: 07/11/22  8:58 AM  Result Value Ref Range   Glucose-Capillary 137 (H) 70 - 99 mg/dL    Comment: Glucose reference range applies only to samples taken after fasting for at least 8 hours.  Surgical pathology     Status: None   Collection Time: 07/11/22  9:27 AM  Result Value Ref Range   SURGICAL PATHOLOGY      SURGICAL PATHOLOGY CASE: [id number] PATIENT: Apolonio Schneiders Surgical Pathology Report     Specimen Submitted: A.  Colon polyp, ascending; cold snare  Clinical History: Screening colonoscopy.  Colon polyp    DIAGNOSIS: A. COLON POLYP, ASCENDING; COLD SNARE: - TUBULAR ADENOMA. - NEGATIVE FOR HIGH-GRADE DYSPLASIA AND MALIGNANCY.  GROSS DESCRIPTION: A. Labeled: Ascending colon polyp cold snare Received: Formalin Collection time: 9:27 AM on 07/11/2022 Placed into formalin time: 9:27 AM on 07/11/2022 Tissue fragment(s): 1 Size: 1.0 x 0.5 x 0.1 cm Description: Tan soft tissue fragment Entirely submitted in 1 cassette.  CM 07/11/2022  Final Diagnosis performed by Allena Napoleon, MD.   Electronically signed 07/12/2022 11:46:01AM The electronic signature indicates that the named Attending Pathologist has evaluated the specimen Technical component performed at [facility], [address], [place], [place] [address] Lab: [phone] Dir: Rush Farmer, MD, MMM  Profess ional component performed at [facility], [facility], [place], [place], [place] [address] Lab: [phone] Dir: Kathi Simpers, MD   POCT HgB A1C     Status: Abnormal   Collection Time: 08/16/22  8:55 AM  Result Value Ref Range   Hemoglobin A1C 6.9 (A) 4.0 - 5.6 %   HbA1c POC (<> result, manual  entry)     HbA1c, POC (prediabetic range)     HbA1c, POC (controlled diabetic range)        PHQ2/9:    08/16/2022    8:54 AM 06/17/2022    1:17 PM 05/05/2022    8:55 AM 01/04/2022    8:08 AM 09/14/2021   10:11 AM  Depression screen PHQ 2/9  Decreased Interest 0 0 0 0 0  Down, Depressed, Hopeless 0 0 0 0 0  PHQ - 2 Score 0 0 0 0 0  Altered sleeping 0 0 0 0   Tired, decreased energy 0 0 0 0   Change in appetite 0 0 0 0   Feeling bad or failure about yourself  0 0 0 0   Trouble concentrating 0 0 0 0   Moving slowly or fidgety/restless 0 0 0 0   Suicidal thoughts 0 0 0 0   PHQ-9 Score 0 0 0 0     phq 9 is negative   Fall Risk:    08/16/2022    8:54 AM 06/17/2022    1:17 PM 05/05/2022    8:54 AM 01/04/2022    8:08 AM 09/14/2021   10:11 AM  Fall Risk   Falls in the past year? 0 0 0 0 0  Number falls in past yr: 0  0 0 0  Injury with Fall? 0  0 0 0  Risk for fall due to : No Fall Risks No Fall Risks No Fall Risks No Fall Risks No Fall Risks  Follow up Falls prevention discussed Falls prevention discussed;Education provided;Falls evaluation completed Falls prevention discussed Falls prevention discussed Falls prevention discussed      Functional Status Survey: Is the patient deaf or have difficulty hearing?: No Does the patient have difficulty seeing, even when wearing glasses/contacts?: No Does the patient have difficulty concentrating, remembering, or making decisions?: No Does the patient have difficulty walking or climbing stairs?: No Does the patient have difficulty dressing or bathing?: No Does the patient have difficulty doing errands alone such as visiting a doctor's office or shopping?: No    Assessment & Plan  1. Type 2 diabetes mellitus with microalbuminuria, without long-term current use of insulin (HCC)  - POCT HgB A1C - Dapagliflozin-metFORMIN HCl ER (XIGDUO XR) 04-999 MG TB24; Take 2 tablets by mouth daily.  Dispense: 180 tablet; Refill: 1 -  COMPLETE  METABOLIC PANEL WITH GFR  2. Amputated finger, sequela ([place])   3. Morbid obesity ([place])  Discussed with the patient the risk posed by an increased BMI. Discussed importance of portion control, calorie counting and at least 150 minutes of physical activity weekly. Avoid sweet beverages and drink more water. Eat at least 6 servings of fruit and vegetables daily    4. Dyslipidemia  - Lipid panel  5. Hypothyroidism, postop   6. History of thyroidectomy, total  - TSH  7. Shift work sleep disorder   8. Benign essential HTN  - CBC with Differential/Platelet - COMPLETE METABOLIC PANEL WITH GFR  9. Microalbuminuria   10. Needs flu shot  - Flu Vaccine QUAD 6+ mos PF IM (Fluarix Quad PF)  11. Long-term use of high-risk medication  - Vitamin B12

## 2022-08-16 ENCOUNTER — Ambulatory Visit (INDEPENDENT_AMBULATORY_CARE_PROVIDER_SITE_OTHER): Payer: BC Managed Care – PPO | Admitting: Family Medicine

## 2022-08-16 ENCOUNTER — Encounter: Payer: Self-pay | Admitting: Family Medicine

## 2022-08-16 VITALS — BP 126/72 | HR 67 | Resp 16 | Ht 67.5 in | Wt 236.0 lb

## 2022-08-16 DIAGNOSIS — E1129 Type 2 diabetes mellitus with other diabetic kidney complication: Secondary | ICD-10-CM | POA: Diagnosis not present

## 2022-08-16 DIAGNOSIS — E1121 Type 2 diabetes mellitus with diabetic nephropathy: Secondary | ICD-10-CM

## 2022-08-16 DIAGNOSIS — Z23 Encounter for immunization: Secondary | ICD-10-CM | POA: Diagnosis not present

## 2022-08-16 DIAGNOSIS — G4726 Circadian rhythm sleep disorder, shift work type: Secondary | ICD-10-CM

## 2022-08-16 DIAGNOSIS — Z79899 Other long term (current) drug therapy: Secondary | ICD-10-CM

## 2022-08-16 DIAGNOSIS — I10 Essential (primary) hypertension: Secondary | ICD-10-CM | POA: Diagnosis not present

## 2022-08-16 DIAGNOSIS — E785 Hyperlipidemia, unspecified: Secondary | ICD-10-CM | POA: Diagnosis not present

## 2022-08-16 DIAGNOSIS — R809 Proteinuria, unspecified: Secondary | ICD-10-CM | POA: Diagnosis not present

## 2022-08-16 DIAGNOSIS — E890 Postprocedural hypothyroidism: Secondary | ICD-10-CM

## 2022-08-16 DIAGNOSIS — S68119S Complete traumatic metacarpophalangeal amputation of unspecified finger, sequela: Secondary | ICD-10-CM | POA: Diagnosis not present

## 2022-08-16 LAB — POCT GLYCOSYLATED HEMOGLOBIN (HGB A1C): Hemoglobin A1C: 6.9 % — AB (ref 4.0–5.6)

## 2022-08-16 MED ORDER — XIGDUO XR 5-1000 MG PO TB24: 2.0000 | ORAL_TABLET | Freq: Every day | ORAL | 1 refills | Status: DC

## 2022-08-16 NOTE — Patient Instructions (Addendum)
Your A1c today: 6.9%

## 2022-08-17 LAB — COMPLETE METABOLIC PANEL WITH GFR
AG Ratio: 1.3 (calc) (ref 1.0–2.5)
ALT: 15 U/L (ref 6–29)
AST: 15 U/L (ref 10–35)
Albumin: 4.2 g/dL (ref 3.6–5.1)
Alkaline phosphatase (APISO): 93 U/L (ref 37–153)
BUN/Creatinine Ratio: 18 (calc) (ref 6–22)
BUN: 21 mg/dL (ref 7–25)
CO2: 26 mmol/L (ref 20–32)
Calcium: 9.7 mg/dL (ref 8.6–10.4)
Chloride: 101 mmol/L (ref 98–110)
Creat: 1.19 mg/dL — ABNORMAL HIGH (ref 0.50–1.05)
Globulin: 3.3 g/dL (calc) (ref 1.9–3.7)
Glucose, Bld: 125 mg/dL — ABNORMAL HIGH (ref 65–99)
Potassium: 4 mmol/L (ref 3.5–5.3)
Sodium: 143 mmol/L (ref 135–146)
Total Bilirubin: 0.5 mg/dL (ref 0.2–1.2)
Total Protein: 7.5 g/dL (ref 6.1–8.1)
eGFR: 52 mL/min/{1.73_m2} — ABNORMAL LOW (ref >=60)

## 2022-08-17 LAB — LIPID PANEL
Cholesterol: 181 mg/dL (ref <200)
HDL: 108 mg/dL (ref >=50)
LDL Cholesterol (Calc): 59 mg/dL (calc)
Non-HDL Cholesterol (Calc): 73 mg/dL (calc) (ref <130)
Total CHOL/HDL Ratio: 1.7 (calc) (ref <5.0)
Triglycerides: 60 mg/dL (ref <150)

## 2022-08-17 LAB — CBC WITH DIFFERENTIAL/PLATELET
Absolute Monocytes: 644 cells/uL (ref 200–950)
Basophils Absolute: 28 cells/uL (ref 0–200)
Basophils Relative: 0.4 %
Eosinophils Absolute: 161 cells/uL (ref 15–500)
Eosinophils Relative: 2.3 %
HCT: 43 % (ref 35.0–45.0)
Hemoglobin: 14.2 g/dL (ref 11.7–15.5)
Lymphs Abs: 3269 cells/uL (ref 850–3900)
MCH: 28.6 pg (ref 27.0–33.0)
MCHC: 33 g/dL (ref 32.0–36.0)
MCV: 86.7 fL (ref 80.0–100.0)
MPV: 11.2 fL (ref 7.5–12.5)
Monocytes Relative: 9.2 %
Neutro Abs: 2898 cells/uL (ref 1500–7800)
Neutrophils Relative %: 41.4 %
Platelets: 211 10*3/uL (ref 140–400)
RBC: 4.96 10*6/uL (ref 3.80–5.10)
RDW: 14.8 % (ref 11.0–15.0)
Total Lymphocyte: 46.7 %
WBC: 7 10*3/uL (ref 3.8–10.8)

## 2022-08-17 LAB — VITAMIN B12: Vitamin B-12: 986 pg/mL (ref 200–1100)

## 2022-08-17 LAB — TSH: TSH: 2.68 mIU/L (ref 0.40–4.50)

## 2022-08-22 ENCOUNTER — Ambulatory Visit
Admission: RE | Admit: 2022-08-22 | Discharge: 2022-08-22 | Disposition: A | Discharge status: Home / Self Care | Payer: BC Managed Care – PPO | Source: Ambulatory Visit | Attending: Family Medicine | Admitting: Family Medicine

## 2022-08-22 DIAGNOSIS — Z1231 Encounter for screening mammogram for malignant neoplasm of breast: Secondary | ICD-10-CM | POA: Insufficient documentation

## 2022-09-02 ENCOUNTER — Ambulatory Visit: Payer: Self-pay | Admitting: *Deleted

## 2022-09-02 NOTE — Telephone Encounter (Signed)
Patient called to request mammogram results from 08/22/22. No documented message noted from PCP. Result reports letter sent to patient regarding results. Please advise .

## 2022-11-11 NOTE — Progress Notes (Signed)
Name: Samantha Copeland   MRN: [id number]    DOB: 07/01/1961   Date:11/14/2022       Progress Note  Subjective  Chief Complaint  Follow Up  HPI  DMII with nephropathy  She is currently on Trulicity and metformin twice daily  6.5% to 6.8%,  6.7% , 6.8 % , 6.4 % , 6.1 %, 6.2 % , 6.1 % 6 % we stopped GLP-1 agonist due to cost  A1C went up to 6.6% last visit 6.9 % so we adjusted dose of Xigduo and today is down to 6.7 %      Symptoms of carpal tunnel resolved with surgery in 2018, last urine micro normalized ( she is on ARB), last GFR showed slight drop again and we will recheck next visit. She also has obesity associated with DM, but lost 6 lbs since last visit. She is still drinking one regular soda per day, states she does not like diet , advised her to try Zero sodas.   HTN: she has been taking Benicar 20/125 mg and low dose Norvasc since earlier 2022,  bp has been well controlled, no chest pain , dizziness or palpitation. She states does not need refills at this time     Hyperlipidemia: she is on Atorvastatin daily and last LDL at goal at 59  Morbid obesity: BMI over 35 with co-morbidities such as DM, HTN and dyslipidemia,  she was on Trulicity, even though still working long hours, weight was stable at 232 lbs. We stopped Trulicity in Feb 2023  due to cost and weight was stable in the 236 lbs range but is down to 230 lbs today - she is on higher dose of Xigduo now    Hypothyroidism: used to see Dr. Willeen Cass had total thyroidectomy in December 2016, doing well, on Synthroid 100 mcg daily and half on Sundays. She  has been released by Dr. Tedd Sias.  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 Chronic and stable. 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) 05/30/2015   History of thyroidectomy, total 05/30/2015   Microalbuminuria 05/30/2015   Morbid obesity (HCC) 05/30/2015    Past Surgical History:  Procedure Laterality Date   ABDOMINAL HYSTERECTOMY     AMPUTATION FINGER Right 1963   Index Finger after injury as a infant   BREAST BIOPSY Right 09/02/2021   Affirm bx-calcs"coil" clip-path pending   BREAST EXCISIONAL BIOPSY Right    benign   COLONOSCOPY WITH PROPOFOL N/A 07/11/2022   Procedure: COLONOSCOPY WITH PROPOFOL;  Surgeon: Wyline Mood, MD;  Location: [facility] ENDOSCOPY;  Service: Gastroenterology;  Laterality: N/A;   DORSAL COMPARTMENT RELEASE Left 01/18/2017   Procedure: RELEASE DORSAL COMPARTMENT (DEQUERVAIN);  Surgeon: Donato Heinz, MD;  Location: [facility] ORS;  Service: Orthopedics;  Laterality: Left;   FINE NEEDLE ASPIRATION  10/01/2013   Thyroid-Dr.  Solum   THYROIDECTOMY N/A 12/09/2015   Procedure: THYROIDECTOMY;  Surgeon: Geanie Logan, MD;  Location: [facility] ORS;  Service: ENT;  Laterality: N/A;    Family History  Problem Relation Age of Onset   Diabetes Mother    Diabetes Father    Hypertension Father    Hyperlipidemia Father    Cancer Father    Diabetes Sister    Breast cancer Neg Hx     Social History   Tobacco Use   Smoking status: Never   Smokeless tobacco: Never  Substance Use Topics   Alcohol use: Yes    Alcohol/week: 0.0 standard drinks of alcohol    Comment: occasionally     Current Outpatient Medications:    acetaminophen (TYLENOL) 325 MG tablet, Take 650 mg by mouth every 6 (six) hours as  needed., Disp: , Rfl:    amLODipine (NORVASC) 2.5 MG tablet, Take 1 tablet (2.5 mg total) by mouth daily., Disp: 90 tablet, Rfl: 1   aspirin EC 81 MG tablet, Take 81 mg by mouth every evening. , Disp: , Rfl:    atorvastatin (LIPITOR) 40 MG tablet, Take 1 tablet (40 mg total) by mouth daily., Disp: 90 tablet, Rfl: 1   Calcium-Phosphorus-Vitamin D (CALCIUM GUMMIES PO), Take 2 tablets by mouth daily., Disp: , Rfl:    Dapagliflozin-metFORMIN HCl ER (XIGDUO XR) 04-999 MG TB24, Take 2 tablets by mouth daily., Disp: 180 tablet, Rfl: 1   levothyroxine (SYNTHROID) 100 MCG tablet, TAKE 1 TABLET BY MOUTH ONCE DAILY AND ONLY ONE-HALF TABLET ON SUNDAYS, Disp: 94 tablet, Rfl: 0   Multiple Vitamin (MULTIVITAMIN WITH MINERALS) TABS tablet, Take 1 tablet by mouth daily. ALIVE MULTIVITAMIN, Disp: , Rfl:    olmesartan-hydrochlorothiazide (BENICAR HCT) 20-12.5 MG tablet, Take 1 tablet by mouth daily., Disp: 90 tablet, Rfl: 1   vitamin B-12 (CYANOCOBALAMIN) 100 MCG tablet, Take 100 mcg by mouth daily., Disp: , Rfl:   No Known Allergies  I personally reviewed active problem list, medication list, allergies, family history, social history, health maintenance with the patient/caregiver today.   ROS  Ten systems reviewed and is negative except as mentioned in HPI   Objective  Vitals:   11/14/22 0901  BP: 120/68  Pulse: 71  Resp: 16  SpO2: 99%  Weight: 230 lb (104.3 kg)  Height: 5\' 6"  (1.676 m)    Body mass index is 37.12 kg/m.  Physical Exam Constitutional: Patient appears well-developed and well-nourished. Obese  No distress.  HEENT: head atraumatic, normocephalic, pupils equal and reactive to light, neck supple Cardiovascular: Normal rate, regular rhythm and normal heart sounds.  No murmur heard. No BLE edema. Pulmonary/Chest: Effort normal and breath sounds normal. No respiratory distress. Abdominal: Soft.  There is no tenderness. Psychiatric: Patient has a normal mood and affect. behavior is  normal. Judgment and thought content normal.   Recent Results (from the past 2160 hour(s))  POCT HgB A1C     Status: Abnormal   Collection Time: 11/14/22  9:05 AM  Result Value Ref Range   Hemoglobin A1C 6.7 (A) 4.0 - 5.6 %   HbA1c POC (<> result, manual entry)     HbA1c, POC (prediabetic range)     HbA1c, POC (controlled diabetic range)       Diabetic Foot Exam: Diabetic Foot Exam - Simple   Simple Foot Form Visual Inspection No deformities, no ulcerations, no other skin breakdown bilaterally: Yes Sensation Testing Intact to touch and monofilament testing bilaterally: Yes Pulse Check Posterior Tibialis and Dorsalis pulse  intact bilaterally: Yes Comments      PHQ2/9:    11/14/2022    9:01 AM 08/16/2022    8:54 AM 06/17/2022    1:17 PM 05/05/2022    8:55 AM 01/04/2022    8:08 AM  Depression screen PHQ 2/9  Decreased Interest 0 0 0 0 0  Down, Depressed, Hopeless 0 0 0 0 0  PHQ - 2 Score 0 0 0 0 0  Altered sleeping 0 0 0 0 0  Tired, decreased energy 0 0 0 0 0  Change in appetite 0 0 0 0 0  Feeling bad or failure about yourself  0 0 0 0 0  Trouble concentrating 0 0 0 0 0  Moving slowly or fidgety/restless 0 0 0 0 0  Suicidal thoughts 0 0 0 0 0  PHQ-9 Score 0 0 0 0 0    phq 9 is negative   Fall Risk:    11/14/2022    9:01 AM 08/16/2022    8:54 AM 06/17/2022    1:17 PM 05/05/2022    8:54 AM 01/04/2022    8:08 AM  Fall Risk   Falls in the past year? 0 0 0 0 0  Number falls in past yr: 0 0  0 0  Injury with Fall? 0 0  0 0  Risk for fall due to : No Fall Risks No Fall Risks No Fall Risks No Fall Risks No Fall Risks  Follow up Falls prevention discussed Falls prevention discussed Falls prevention discussed;Education provided;Falls evaluation completed Falls prevention discussed Falls prevention discussed      Functional Status Survey: Is the patient deaf or have difficulty hearing?: No Does the patient have difficulty seeing, even when wearing glasses/contacts?:  No Does the patient have difficulty concentrating, remembering, or making decisions?: No Does the patient have difficulty walking or climbing stairs?: No Does the patient have difficulty dressing or bathing?: No Does the patient have difficulty doing errands alone such as visiting a doctor's office or shopping?: No    Assessment & Plan  1. Type 2 diabetes mellitus with microalbuminuria, without long-term current use of insulin (HCC)  - POCT HgB A1C - [facility] Diabetes Foot Exam - Urine Microalbumin w/creat. ratio   2. Amputated finger, sequela (HCC)  Unchanged  3. Morbid obesity (HCC)  Discussed with the patient the risk posed by an increased BMI. Discussed importance of portion control, calorie counting and at least 150 minutes of physical activity weekly. Avoid sweet beverages and drink more water. Eat at least 6 servings of fruit and vegetables daily    4. History of thyroidectomy, total   5. Benign essential HTN  At goal   6. Hypothyroidism, postop  Continue current dose of medication  7. Microalbuminuria  Recheck today   8. Shift work sleep disorder

## 2022-11-14 ENCOUNTER — Ambulatory Visit (INDEPENDENT_AMBULATORY_CARE_PROVIDER_SITE_OTHER): Payer: BC Managed Care – PPO | Admitting: Family Medicine

## 2022-11-14 ENCOUNTER — Encounter: Payer: Self-pay | Admitting: Family Medicine

## 2022-11-14 VITALS — BP 120/68 | HR 71 | Resp 16 | Ht 66.0 in | Wt 230.0 lb

## 2022-11-14 DIAGNOSIS — G4726 Circadian rhythm sleep disorder, shift work type: Secondary | ICD-10-CM

## 2022-11-14 DIAGNOSIS — E1129 Type 2 diabetes mellitus with other diabetic kidney complication: Secondary | ICD-10-CM

## 2022-11-14 DIAGNOSIS — E890 Postprocedural hypothyroidism: Secondary | ICD-10-CM | POA: Diagnosis not present

## 2022-11-14 DIAGNOSIS — I10 Essential (primary) hypertension: Secondary | ICD-10-CM

## 2022-11-14 DIAGNOSIS — R809 Proteinuria, unspecified: Secondary | ICD-10-CM | POA: Diagnosis not present

## 2022-11-14 DIAGNOSIS — S68119S Complete traumatic metacarpophalangeal amputation of unspecified finger, sequela: Secondary | ICD-10-CM | POA: Diagnosis not present

## 2022-11-14 LAB — POCT GLYCOSYLATED HEMOGLOBIN (HGB A1C): Hemoglobin A1C: 6.7 % — AB (ref 4.0–5.6)

## 2022-11-15 LAB — MICROALBUMIN / CREATININE URINE RATIO
Creatinine, Urine: 51 mg/dL (ref 20–275)
Microalb Creat Ratio: 6 mcg/mg creat (ref <30)
Microalb, Ur: 0.3 mg/dL

## 2022-11-28 ENCOUNTER — Ambulatory Visit: Payer: Self-pay | Admitting: *Deleted

## 2022-11-28 NOTE — Telephone Encounter (Signed)
Summary: Vaginal Issues, advice   Pt is having a vaginal issue and wants some advice from Dr Ancil Boozer nurse.  [phone].          Called patient [phone] to review sx. No answer, LVMTCB [phone].

## 2022-11-28 NOTE — Progress Notes (Signed)
Name: Samantha Copeland   MRN: [id number]    DOB: 09/29/1961   Date:11/29/2022       Progress Note  Subjective  Chief Complaint  Clitoris pain  HPI  Patient noticed some irritation and pain on her clitoris over the past 5 days, not as painful today, no ulceration, bleeding or oozing. She is not sure is started after wiping herself too fast or hard. 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 ([place]) 05/30/2015   History of thyroidectomy, total 05/30/2015   Microalbuminuria 05/30/2015   Morbid obesity ([place]) 05/30/2015    Past Surgical History:  Procedure Laterality Date   ABDOMINAL HYSTERECTOMY     AMPUTATION FINGER Right 1963   Index Finger after injury as a infant   BREAST BIOPSY Right 09/02/2021   Affirm bx-calcs"coil" clip-path pending   BREAST EXCISIONAL BIOPSY Right    benign   COLONOSCOPY WITH PROPOFOL N/A 07/11/2022   Procedure: COLONOSCOPY WITH PROPOFOL;  Surgeon: Jonathon Bellows, MD;  Location: [facility] ENDOSCOPY;  Service: Gastroenterology;  Laterality: N/A;   DORSAL COMPARTMENT RELEASE Left 01/18/2017   Procedure: RELEASE DORSAL COMPARTMENT (DEQUERVAIN);  Surgeon: Dereck Leep, MD;  Location: [facility] ORS;  Service: Orthopedics;  Laterality: Left;   FINE NEEDLE ASPIRATION  10/01/2013   Thyroid-Dr. Gabriel Carina   THYROIDECTOMY N/A 12/09/2015   Procedure: THYROIDECTOMY;  Surgeon: Clyde Canterbury, MD;  Location: [facility] ORS;  Service: ENT;  Laterality: N/A;    Family History  Problem Relation Age of Onset   Diabetes Mother    Diabetes Father    Hypertension Father     Hyperlipidemia Father    Cancer Father    Diabetes Sister    Breast cancer Neg Hx     Social History   Tobacco Use   Smoking status: Never   Smokeless tobacco: Never  Substance Use Topics   Alcohol use: Yes    Alcohol/week: 0.0 standard drinks of alcohol    Comment: occasionally     Current Outpatient Medications:    acetaminophen (TYLENOL) 325 MG tablet, Take 650 mg by mouth every 6 (six) hours as needed., Disp: , Rfl:    amLODipine (NORVASC) 2.5 MG tablet, Take 1 tablet (2.5 mg total) by mouth daily., Disp: 90 tablet, Rfl: 1   aspirin EC 81 MG tablet, Take 81 mg by mouth every evening. , Disp: , Rfl:    atorvastatin (LIPITOR) 40 MG tablet, Take 1 tablet (40 mg total) by mouth daily., Disp: 90 tablet, Rfl: 1   Calcium-Phosphorus-Vitamin D (CALCIUM GUMMIES PO), Take 2 tablets by mouth daily., Disp: , Rfl:    Dapagliflozin-metFORMIN HCl ER (XIGDUO XR) 04-999 MG TB24, Take 2 tablets by mouth daily., Disp: 180 tablet, Rfl: 1   levothyroxine (SYNTHROID) 100 MCG tablet, TAKE 1 TABLET BY MOUTH ONCE DAILY AND ONLY ONE-HALF TABLET ON SUNDAYS, Disp: 94 tablet, Rfl: 0   Multiple Vitamin (MULTIVITAMIN WITH MINERALS) TABS tablet, Take 1 tablet by mouth daily. ALIVE MULTIVITAMIN, Disp: , Rfl:    olmesartan-hydrochlorothiazide (BENICAR HCT) 20-12.5 MG tablet, Take 1 tablet by mouth daily., Disp: 90 tablet, Rfl: 1   vitamin B-12 (CYANOCOBALAMIN)  100 MCG tablet, Take 100 mcg by mouth daily., Disp: , Rfl:   No Known Allergies  I personally reviewed active problem list, medication list, allergies, family history, social history, health maintenance with the patient/caregiver today.   ROS  Ten systems reviewed and is negative except as mentioned in HPI   Objective  Vitals:   11/29/22 1424  BP: 110/68  Pulse: 69  Resp: 16  SpO2: 99%  Weight: 230 lb (104.3 kg)  Height: '5\' 6"'$  (1.676 m)    Body mass index is 37.12 kg/m.  Physical Exam  Constitutional: Patient appears well-developed  and well-nourished. Obese  No distress.  Vulva Exam: clitoris showed some adhesions on right side and slightly discomfort to touch, no redness or swelling.  Cardiovascular: Normal rate, regular rhythm and normal heart sounds.  No murmur heard. No BLE edema. Pulmonary/Chest: Effort normal and breath sounds normal. No respiratory distress. Abdominal: Soft.  There is no tenderness. Psychiatric: Patient has a normal mood and affect. behavior is normal. Judgment and thought content normal.   Recent Results (from the past 2160 hour(s))  POCT HgB A1C     Status: Abnormal   Collection Time: 11/14/22  9:05 AM  Result Value Ref Range   Hemoglobin A1C 6.7 (A) 4.0 - 5.6 %   HbA1c POC (<> result, manual entry)     HbA1c, POC (prediabetic range)     HbA1c, POC (controlled diabetic range)    Urine Microalbumin w/creat. ratio     Status: None   Collection Time: 11/14/22 10:14 AM  Result Value Ref Range   Creatinine, Urine 51 20 - 275 mg/dL   Microalb, Ur 0.3 mg/dL    Comment: Reference Range Not established    Microalb Creat Ratio 6 <30 mcg/mg creat    Comment: . The ADA defines abnormalities in albumin excretion as follows: Marland Kitchen Albuminuria Category        Result (mcg/mg creatinine) . Normal to Mildly increased   <30 Moderately increased         30-299  Severely increased           > OR = 300 . The ADA recommends that at least two of three specimens collected within a 3-6 month period be abnormal before considering a patient to be within a diagnostic category.     PHQ2/9:    11/29/2022    2:24 PM 11/14/2022    9:01 AM 08/16/2022    8:54 AM 06/17/2022    1:17 PM 05/05/2022    8:55 AM  Depression screen PHQ 2/9  Decreased Interest 0 0 0 0 0  Down, Depressed, Hopeless 0 0 0 0 0  PHQ - 2 Score 0 0 0 0 0  Altered sleeping 0 0 0 0 0  Tired, decreased energy 0 0 0 0 0  Change in appetite 0 0 0 0 0  Feeling bad or failure about yourself  0 0 0 0 0  Trouble concentrating 0 0 0 0 0  Moving  slowly or fidgety/restless 0 0 0 0 0  Suicidal thoughts 0 0 0 0 0  PHQ-9 Score 0 0 0 0 0    phq 9 is negative   Fall Risk:    11/29/2022    2:24 PM 11/14/2022    9:01 AM 08/16/2022    8:54 AM 06/17/2022    1:17 PM 05/05/2022    8:54 AM  Fall Risk   Falls in the past year? 0 0 0 0 0  Number  falls in past yr: 0 0 0  0  Injury with Fall? 0 0 0  0  Risk for fall due to : No Fall Risks No Fall Risks No Fall Risks No Fall Risks No Fall Risks  Follow up Falls prevention discussed Falls prevention discussed Falls prevention discussed Falls prevention discussed;Education provided;Falls evaluation completed Falls prevention discussed      Functional Status Survey: Is the patient deaf or have difficulty hearing?: No Does the patient have difficulty seeing, even when wearing glasses/contacts?: No Does the patient have difficulty concentrating, remembering, or making decisions?: No Does the patient have difficulty walking or climbing stairs?: No Does the patient have difficulty dressing or bathing?: No Does the patient have difficulty doing errands alone such as visiting a doctor's office or shopping?: No    Assessment & Plan  1. Irritation of clitoris  Reassurance given, apply A&D and massage the area, let me know if no resolution

## 2022-11-28 NOTE — Telephone Encounter (Signed)
Summary: Vaginal Issues, advice   Pt is having a vaginal issue and wants some advice from Dr Ancil Boozer nurse.  [phone].       Chief Complaint: clitoris pain Symptoms: clitoris pain mild. No redness not drainage no redness no swelling  Frequency: 1 week Pertinent Negatives: Patient denies fever, see above sx  Disposition: '[]'$ ED /'[]'$ Urgent Care (no appt availability in office) / '[x]'$ Appointment(In office/virtual)/ '[]'$  [place] [facility]/ '[]'$ Home Care/ '[]'$ Refused Recommended Disposition /'[]'$ [place] [facility]/ '[]'$  Follow-up with PCP Additional Notes:   Appt scheduled for tomorrow.      Reason for Disposition  [1] MILD-MODERATE pain AND [2] present > 24 hours  (Exception: Chronic pain.)  Answer Assessment - Initial Assessment Questions 1. SYMPTOM: "What's the main symptom you're concerned about?" (e.g., pain, itching, dryness)     Soreness to clitoris area 2. LOCATION: "Where is the  pain  located?" (e.g., inside/outside, left/right)     Clitoris area 3. ONSET: "When did the  pain   start?"     Approx 1 week ago 4. PAIN: "Is there any pain?" If Yes, ask: "How bad is it?" (Scale: 1-10; mild, moderate, severe)   -  MILD (1-3): Doesn't interfere with normal activities.    -  MODERATE (4-7): Interferes with normal activities (e.g., work or school) or awakens from sleep.     -  SEVERE (8-10): Excruciating pain, unable to do any normal activities.     Mild  5. ITCHING: "Is there any itching?" If Yes, ask: "How bad is it?" (Scale: 1-10; mild, moderate, severe)     no 6. CAUSE: "What do you think is causing the discharge?" "Have you had the same problem before? What happened then?"     No discharge reported  7. OTHER SYMPTOMS: "Do you have any other symptoms?" (e.g., fever, itching, vaginal bleeding, pain with urination, injury to genital area, vaginal foreign body)     No  8. PREGNANCY: "Is there any chance you are pregnant?" "When was your last menstrual period?"     na  Protocols  used: Vaginal Symptoms-A-AH

## 2022-11-29 ENCOUNTER — Ambulatory Visit (INDEPENDENT_AMBULATORY_CARE_PROVIDER_SITE_OTHER): Payer: BC Managed Care – PPO | Admitting: Family Medicine

## 2022-11-29 ENCOUNTER — Encounter: Payer: Self-pay | Admitting: Family Medicine

## 2022-11-29 VITALS — BP 110/68 | HR 69 | Resp 16 | Ht 66.0 in | Wt 230.0 lb

## 2022-11-29 DIAGNOSIS — N9089 Other specified noninflammatory disorders of vulva and perineum: Secondary | ICD-10-CM

## 2022-12-07 ENCOUNTER — Other Ambulatory Visit: Payer: Self-pay | Admitting: Family Medicine

## 2022-12-07 DIAGNOSIS — E785 Hyperlipidemia, unspecified: Secondary | ICD-10-CM

## 2022-12-10 ENCOUNTER — Telehealth: Payer: Self-pay | Admitting: Family Medicine

## 2022-12-10 DIAGNOSIS — E785 Hyperlipidemia, unspecified: Secondary | ICD-10-CM

## 2022-12-11 NOTE — Telephone Encounter (Signed)
Last reordered 12/08/22 90 tabs  Requested Prescriptions  Refused Prescriptions Disp Refills   atorvastatin (LIPITOR) 40 MG tablet [Pharmacy Med Name: Atorvastatin Calcium 40 MG Oral Tablet] 90 tablet 0    Sig: Take 1 tablet by mouth once daily     Cardiovascular:  Antilipid - Statins Failed - 12/10/2022  4:08 PM      Failed - Lipid Panel in normal range within the last 12 months    Cholesterol, Total  Date Value Ref Range Status  06/06/2016 146 100 - 199 mg/dL Final   Cholesterol  Date Value Ref Range Status  08/16/2022 181 <200 mg/dL Final   LDL Cholesterol (Calc)  Date Value Ref Range Status  08/16/2022 59 mg/dL (calc) Final    Comment:    Reference range: <100 . Desirable range <100 mg/dL for primary prevention;   <70 mg/dL for patients with CHD or diabetic patients  with > or = 2 CHD risk factors. Marland Kitchen LDL-C is now calculated using the Martin-Hopkins  calculation, which is a validated novel method providing  better accuracy than the Friedewald equation in the  estimation of LDL-C.  Cresenciano Genre et al. Annamaria Helling. 0315;945(85): 2061-2068  (http://education.[employer].com/faq/FAQ164)    HDL  Date Value Ref Range Status  08/16/2022 108 > OR = 50 mg/dL Final  06/06/2016 99 >39 mg/dL Final   Triglycerides  Date Value Ref Range Status  08/16/2022 60 <150 mg/dL Final         Passed - Patient is not pregnant      Passed - Valid encounter within last 12 months    Recent Outpatient Visits           1 week ago Irritation of clitoris   [facility] [place], Drue Stager, MD   3 weeks ago Type 2 diabetes mellitus with microalbuminuria, without long-term current use of insulin [facility])   [facility] [place], Drue Stager, MD   3 months ago Type 2 diabetes mellitus with microalbuminuria, without long-term current use of insulin [facility])   [facility] Steele Sizer, MD   5 months ago Well adult exam   [place] Steele Sizer, MD   7 months ago Diabetic nephropathy associated with type 2 diabetes mellitus [facility])   [facility] Steele Sizer, MD       Future Appointments             In 2 months Ancil Boozer, Drue Stager, MD [facility], [place]   In 6 months Steele Sizer, MD [facility], [facility]

## 2023-02-17 NOTE — Progress Notes (Unsigned)
Name: Samantha Copeland   MRN: [id number]    DOB: [birth year]   Date:02/17/2023       Progress Note  Subjective  Chief Complaint  Follow Up  HPI  DMII with nephropathy  She is currently on Trulicity and metformin twice daily  6.5% to 6.8%,  6.7% , 6.8 % , 6.4 % , 6.1 %, 6.2 % , 6.1 % 6 % we stopped GLP-1 agonist due to cost  A1C went up to 6.6% last visit 6.9 % so we adjusted dose of Xigduo and today is down to 6.7 %      Symptoms of carpal tunnel resolved with surgery in 2018, last urine micro normalized ( she is on ARB), last GFR showed slight drop again and we will recheck next visit. She also has obesity associated with DM, but lost 6 lbs since last visit. She is still drinking one regular soda per day, states she does not like diet , advised her to try Zero sodas.   HTN: she has been taking Benicar 20/125 mg and low dose Norvasc since earlier 2022,  bp has been well controlled, no chest pain , dizziness or palpitation. She states does not need refills at this time     Hyperlipidemia: she is on Atorvastatin daily and last LDL at goal at 59  Morbid obesity: BMI over 35 with co-morbidities such as DM, HTN and dyslipidemia,  she was on Trulicity, even though still working long hours, weight was stable at 232 lbs. We stopped Trulicity in Feb [id number]  due to cost and weight was stable in the 236 lbs range but is down to 230 lbs today - she is on higher dose of Xigduo now    Hypothyroidism: used to see Dr. Richardson Landry had total thyroidectomy in December 2016, doing well, on Synthroid 100 mcg daily and half on Sundays. She  has been released by Dr. Gabriel Carina.  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 Chronic and stable. 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 ([place]) 05/30/2015   History of thyroidectomy, total 05/30/2015   Microalbuminuria 05/30/2015   Morbid obesity ([place]) 05/30/2015    Past Surgical History:  Procedure Laterality Date   ABDOMINAL HYSTERECTOMY     AMPUTATION FINGER Right 1963   Index Finger after injury as a infant   BREAST BIOPSY Right 09/02/2021   Affirm bx-calcs"coil" clip-path pending   BREAST EXCISIONAL BIOPSY Right    benign   COLONOSCOPY WITH PROPOFOL N/A 07/11/2022   Procedure: COLONOSCOPY WITH PROPOFOL;  Surgeon: Jonathon Bellows, MD;  Location: [facility] ENDOSCOPY;  Service: Gastroenterology;  Laterality: N/A;   DORSAL COMPARTMENT RELEASE Left 01/18/2017   Procedure: RELEASE DORSAL COMPARTMENT (DEQUERVAIN);  Surgeon: Dereck Leep, MD;  Location: [facility] ORS;  Service: Orthopedics;  Laterality: Left;   FINE NEEDLE ASPIRATION  10/01/2013   Thyroid-Dr.  Solum   THYROIDECTOMY N/A 12/09/2015   Procedure: THYROIDECTOMY;  Surgeon: Clyde Canterbury, MD;  Location: [facility] ORS;  Service: ENT;  Laterality: N/A;    Family History  Problem Relation Age of Onset   Diabetes Mother    Diabetes Father    Hypertension Father    Hyperlipidemia Father    Cancer Father    Diabetes Sister    Breast cancer Neg Hx     Social History   Tobacco Use   Smoking status: Never   Smokeless tobacco: Never  Substance Use Topics   Alcohol use: Yes    Alcohol/week: 0.0 standard drinks of alcohol    Comment: occasionally     Current Outpatient Medications:    acetaminophen (TYLENOL) 325 MG tablet, Take 650 mg by mouth every 6 (six) hours as  needed., Disp: , Rfl:    amLODipine (NORVASC) 2.5 MG tablet, Take 1 tablet (2.5 mg total) by mouth daily., Disp: 90 tablet, Rfl: 1   aspirin EC 81 MG tablet, Take 81 mg by mouth every evening. , Disp: , Rfl:    atorvastatin (LIPITOR) 40 MG tablet, Take 1 tablet by mouth once daily, Disp: 90 tablet, Rfl: 0   Calcium-Phosphorus-Vitamin D (CALCIUM GUMMIES PO), Take 2 tablets by mouth daily., Disp: , Rfl:    Dapagliflozin-metFORMIN HCl ER (XIGDUO XR) 04-999 MG TB24, Take 2 tablets by mouth daily., Disp: 180 tablet, Rfl: 1   levothyroxine (SYNTHROID) 100 MCG tablet, TAKE 1 TABLET BY MOUTH ONCE DAILY AND ONLY ONE-HALF TABLET ON SUNDAYS, Disp: 94 tablet, Rfl: 0   Multiple Vitamin (MULTIVITAMIN WITH MINERALS) TABS tablet, Take 1 tablet by mouth daily. ALIVE MULTIVITAMIN, Disp: , Rfl:    olmesartan-hydrochlorothiazide (BENICAR HCT) 20-12.5 MG tablet, Take 1 tablet by mouth daily., Disp: 90 tablet, Rfl: 1   vitamin B-12 (CYANOCOBALAMIN) 100 MCG tablet, Take 100 mcg by mouth daily., Disp: , Rfl:   No Known Allergies  I personally reviewed active problem list, medication list, allergies, family history, social history, health maintenance with the patient/caregiver today.   ROS  ***  Objective  There were no vitals filed for this visit.  There is no height or weight on file to calculate BMI.  Physical Exam ***  No results found for this or any previous visit (from the past 2160 hour(s)).   PHQ2/9:    11/29/2022    2:24 PM 11/14/2022    9:01 AM 08/16/2022    8:54 AM 06/17/2022    1:17 PM 05/05/2022    8:55 AM  Depression screen PHQ 2/9  Decreased Interest 0 0 0 0 0  Down, Depressed, Hopeless 0 0 0 0 0  PHQ - 2 Score 0 0 0 0 0  Altered sleeping 0 0 0 0 0  Tired, decreased energy 0 0 0 0 0  Change in appetite 0 0 0 0 0  Feeling bad or failure about yourself  0 0 0 0 0  Trouble concentrating 0 0 0 0 0  Moving slowly or fidgety/restless 0 0 0 0 0  Suicidal thoughts 0 0 0 0 0  PHQ-9 Score 0  0 0 0 0    phq 9 is {gen pos [id number]   Fall Risk:    11/29/2022    2:24 PM 11/14/2022    9:01 AM 08/16/2022    8:54 AM 06/17/2022    1:17 PM 05/05/2022    8:54 AM  Fall Risk   Falls in the past year? 0 0 0 0 0  Number falls in past yr: 0 0 0  0  Injury with Fall? 0 0 0  0  Risk for fall due to : No Fall Risks No Fall Risks No Fall Risks No Fall Risks No Fall Risks  Follow up Falls prevention discussed Falls prevention discussed Falls prevention discussed Falls prevention discussed;Education provided;Falls evaluation completed Falls prevention discussed      Functional Status Survey:      Assessment & Plan  *** There are no diagnoses linked to this encounter.

## 2023-02-18 ENCOUNTER — Other Ambulatory Visit: Payer: Self-pay | Admitting: Family Medicine

## 2023-02-18 DIAGNOSIS — E1129 Type 2 diabetes mellitus with other diabetic kidney complication: Secondary | ICD-10-CM

## 2023-02-20 ENCOUNTER — Encounter: Payer: Self-pay | Admitting: Family Medicine

## 2023-02-20 ENCOUNTER — Ambulatory Visit (INDEPENDENT_AMBULATORY_CARE_PROVIDER_SITE_OTHER): Payer: BC Managed Care – PPO | Admitting: Family Medicine

## 2023-02-20 VITALS — BP 112/68 | HR 73 | Temp 97.7°F | Resp 16 | Ht 67.5 in | Wt 226.6 lb

## 2023-02-20 DIAGNOSIS — E669 Obesity, unspecified: Secondary | ICD-10-CM | POA: Diagnosis not present

## 2023-02-20 DIAGNOSIS — R809 Proteinuria, unspecified: Secondary | ICD-10-CM | POA: Diagnosis not present

## 2023-02-20 DIAGNOSIS — G4726 Circadian rhythm sleep disorder, shift work type: Secondary | ICD-10-CM

## 2023-02-20 DIAGNOSIS — S68119S Complete traumatic metacarpophalangeal amputation of unspecified finger, sequela: Secondary | ICD-10-CM

## 2023-02-20 DIAGNOSIS — E66811 Obesity, class 1: Secondary | ICD-10-CM

## 2023-02-20 DIAGNOSIS — E785 Hyperlipidemia, unspecified: Secondary | ICD-10-CM

## 2023-02-20 DIAGNOSIS — E1129 Type 2 diabetes mellitus with other diabetic kidney complication: Secondary | ICD-10-CM | POA: Diagnosis not present

## 2023-02-20 DIAGNOSIS — E890 Postprocedural hypothyroidism: Secondary | ICD-10-CM

## 2023-02-20 DIAGNOSIS — I10 Essential (primary) hypertension: Secondary | ICD-10-CM

## 2023-02-20 DIAGNOSIS — Z9089 Acquired absence of other organs: Secondary | ICD-10-CM

## 2023-02-20 LAB — POCT GLYCOSYLATED HEMOGLOBIN (HGB A1C): Hemoglobin A1C: 6.7 % — AB (ref 4.0–5.6)

## 2023-02-20 MED ORDER — ATORVASTATIN CALCIUM 40 MG PO TABS: 40.0000 mg | ORAL_TABLET | Freq: Every day | ORAL | 1 refills | Status: DC

## 2023-02-20 MED ORDER — DAPAGLIFLOZIN PRO-METFORMIN ER 5-1000 MG PO TB24
2.0000 | ORAL_TABLET | Freq: Every day | ORAL | 1 refills | Status: DC
Start: 2023-02-20 — End: 2023-09-25

## 2023-02-20 MED ORDER — LEVOTHYROXINE SODIUM 100 MCG PO TABS
ORAL_TABLET | ORAL | 1 refills | Status: DC
Start: 2023-02-20 — End: 2023-08-11

## 2023-02-27 ENCOUNTER — Ambulatory Visit: Payer: BC Managed Care – PPO

## 2023-02-27 DIAGNOSIS — I10 Essential (primary) hypertension: Secondary | ICD-10-CM

## 2023-02-27 NOTE — Progress Notes (Signed)
Per [place] last note: HTN: she has been taking Benicar 20/125 mg and low dose Norvasc since earlier 2022 BP has been towards low end of normal, we will stop norvasc 2.5 mg today and she will return for bp check only with cma next week, if needed we will switch Benicar 20/12.5 to 20/25 and monitor No chest pain, palpitation or SOB. There was some confusion patient kept everything the same. Blood pressure today is 116 /68.  Please advise if you now want her to stop norvasc 2.5?

## 2023-02-28 ENCOUNTER — Telehealth: Payer: Self-pay | Admitting: Family Medicine

## 2023-02-28 NOTE — Telephone Encounter (Signed)
Copied from [place] [id number]. Topic: General - Other >> Feb 28, 2023  1:05 PM Denman George T wrote: Reason for CRM: Patient said she was returning a call but no documentation. Please f/u with patient for further instructions

## 2023-03-06 ENCOUNTER — Ambulatory Visit: Payer: BC Managed Care – PPO

## 2023-03-06 VITALS — BP 126/70

## 2023-03-06 DIAGNOSIS — Z0130 Encounter for examination of blood pressure without abnormal findings: Secondary | ICD-10-CM

## 2023-03-06 NOTE — Progress Notes (Signed)
Patient came in for a bp check since being off bp meds. Dr. Ancil Boozer notified of results and instructed pt to stay off of medication since it is doing good. Patient verbalized understanding.

## 2023-05-22 LAB — HM DIABETES EYE EXAM

## 2023-05-29 NOTE — Progress Notes (Unsigned)
Name: Samantha Copeland   MRN: [id number]    DOB: 04-28-1961   Date:05/30/2023       Progress Note  Subjective  Chief Complaint  Follow Up  HPI  DMII with nephropathy  She took Trulicity and metformin for a long time bue  we stopped GLP-1 agonist due to cost  A1C went up to 6.6% , she is currently taking Xigduo 04/999 and she is taking it BID due to hypoglycemic episodes. - she wants to continue taking it like this. A1C is at goal at 6.6 % Symptoms of carpal tunnel resolved with surgery in 2018, last urine micro normalized ( she is on ARB and SGL-2 agonist )  HTN: she has been taking Benicar 20/125 mg and low dose Norvasc since earlier 2022 BP has been towards low end of normal,  we stopped norvasc 2.5 mg but she thinks she also stopped Benicar hydrochlorothiazide . Advised to call us back to confirm.     Hyperlipidemia: she is on Atorvastatin daily and last LDL at goal at 59, continue current regiment Recheck labs in Sept   Obesity: current BMI is below 35 . She used to be morbidly obese with a BMI over 35 with co-morbidities such as DM, HTN and dyslipidemia,  she was on Trulicity, even though still working long hours, weight was stable at 232 lbs. We stopped Trulicity in Feb 2023  due to cost and weight was stable in the 236 lbs range but losing again, eating smaller portions and weight has been stable now in the 220's range    Hypothyroidism: used to see Dr. Willeen Cass had total thyroidectomy in December 2016, doing well, on Synthroid 100 mcg daily and half on Sundays. She  has been released by Dr. Tedd Sias.  Last TSH at goal and we will continue current regiment. 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) 05/30/2015   History of thyroidectomy, total 05/30/2015   Microalbuminuria 05/30/2015   Morbid obesity ([facility]) 05/30/2015    Past Surgical History:  Procedure Laterality Date   ABDOMINAL HYSTERECTOMY     AMPUTATION FINGER Right 1963   Index Finger after injury as a infant   BREAST BIOPSY Right 09/02/2021   Affirm bx-calcs"coil" clip-path pending   BREAST EXCISIONAL BIOPSY Right    benign   COLONOSCOPY WITH PROPOFOL N/A 07/11/2022   Procedure: COLONOSCOPY WITH PROPOFOL;  Surgeon: Wyline Mood, MD;  Location: [facility] ENDOSCOPY;  Service: Gastroenterology;  Laterality: N/A;   DORSAL COMPARTMENT RELEASE Left 01/18/2017   Procedure: RELEASE DORSAL COMPARTMENT (DEQUERVAIN);  Surgeon: Donato Heinz, MD;  Location: [facility] ORS;  Service: Orthopedics;  Laterality: Left;   FINE NEEDLE ASPIRATION  10/01/2013   Thyroid-Dr. Tedd Sias   THYROIDECTOMY N/A 12/09/2015   Procedure: THYROIDECTOMY;  Surgeon: Geanie Logan, MD;  Location: [facility] ORS;  Service: ENT;  Laterality: N/A;    Family  History  Problem Relation Age of Onset   Diabetes Mother    Diabetes Father    Hypertension Father    Hyperlipidemia Father    Cancer Father    Diabetes Sister    Breast cancer Neg Hx     Social History   Tobacco Use   Smoking status: Never   Smokeless tobacco: Never  Substance Use Topics   Alcohol use: Yes    Alcohol/week: 0.0 standard drinks of alcohol    Comment: occasionally     Current Outpatient Medications:    acetaminophen (TYLENOL) 325 MG tablet, Take 650 mg by mouth every 6 (six) hours as needed., Disp: , Rfl:    aspirin EC 81 MG  tablet, Take 81 mg by mouth every evening. , Disp: , Rfl:    atorvastatin (LIPITOR) 40 MG tablet, Take 1 tablet (40 mg total) by mouth daily., Disp: 90 tablet, Rfl: 1   Calcium-Phosphorus-Vitamin D (CALCIUM GUMMIES PO), Take 2 tablets by mouth daily., Disp: , Rfl:    Dapagliflozin Pro-metFORMIN ER (XIGDUO XR) 04-999 MG TB24, Take 2 tablets by mouth daily., Disp: 180 tablet, Rfl: 1   levothyroxine (SYNTHROID) 100 MCG tablet, TAKE 1 TABLET BY MOUTH ONCE DAILY AND ONLY ONE-HALF TABLET ON SUNDAYS, Disp: 94 tablet, Rfl: 1   Multiple Vitamin (MULTIVITAMIN WITH MINERALS) TABS tablet, Take 1 tablet by mouth daily. ALIVE MULTIVITAMIN, Disp: , Rfl:    olmesartan-hydrochlorothiazide (BENICAR HCT) 20-12.5 MG tablet, Take 1 tablet by mouth daily., Disp: 90 tablet, Rfl: 1  No Known Allergies  I personally reviewed active problem list, medication list, allergies, family history, social history, health maintenance with the patient/caregiver today.   ROS  Ten systems reviewed and is negative except as mentioned in HPI   Objective  Vitals:   05/30/23 0815  BP: 124/70  Pulse: 63  Resp: 18  Temp: 98 F (36.7 C)  TempSrc: Oral  SpO2: 98%  Weight: 228 lb 4.8 oz (103.6 kg)  Height: 5\' 9"  (1.753 m)    Body mass index is 33.71 kg/m.  Physical Exam  Constitutional: Patient appears well-developed and well-nourished. Obese  No distress.  HEENT: head atraumatic, normocephalic, pupils equal and reactive to light, neck supple Cardiovascular: Normal rate, regular rhythm and normal heart sounds.  No murmur heard. No BLE edema. Pulmonary/Chest: Effort normal and breath sounds normal. No respiratory distress. Abdominal: Soft.  There is no tenderness. Muscular skeletal: distal index finger right hand amputated  Psychiatric: Patient has a normal mood and affect. behavior is normal. Judgment and thought content normal.    Recent Results (from the past 2160 hour(s))  HM DIABETES EYE EXAM     Status: None    Collection Time: 05/22/23 12:00 AM  Result Value Ref Range   HM Diabetic Eye Exam No Retinopathy No Retinopathy  POCT HgB A1C     Status: Abnormal   Collection Time: 05/30/23  8:40 AM  Result Value Ref Range   Hemoglobin A1C 6.6 (A) 4.0 - 5.6 %   HbA1c POC (<> result, manual entry)     HbA1c, POC (prediabetic range)     HbA1c, POC (controlled diabetic range)      PHQ2/9:    05/30/2023    8:23 AM 02/20/2023   10:22 AM 11/29/2022    2:24 PM 11/14/2022    9:01 AM 08/16/2022    8:54 AM  Depression screen PHQ 2/9  Decreased Interest 0 0 0 0 0  Down, Depressed, Hopeless 0 0 0 0 0  PHQ - 2 Score 0 0 0 0 0  Altered sleeping 0 0 0 0 0  Tired, decreased energy 0 0 0 0 0  Change in appetite 0 0 0 0 0  Feeling bad or failure about yourself  0 0 0 0 0  Trouble concentrating 0 0 0 0 0  Moving slowly or fidgety/restless 0 0 0 0 0  Suicidal thoughts 0 0 0 0 0  PHQ-9 Score 0 0 0 0 0    phq 9 is negative   Fall Risk:    05/30/2023    8:23 AM 02/20/2023   10:11 AM 11/29/2022    2:24 PM 11/14/2022    9:01 AM 08/16/2022    8:54 AM  Fall Risk   Falls in the past year? 0 0 0 0 0  Number falls in past yr:   0 0 0  Injury with Fall?   0 0 0  Risk for fall due to : No Fall Risks No Fall Risks No Fall Risks No Fall Risks No Fall Risks  Follow up Falls prevention discussed Falls prevention discussed Falls prevention discussed Falls prevention discussed Falls prevention discussed      Functional Status Survey: Is the patient deaf or have difficulty hearing?: No Does the patient have difficulty seeing, even when wearing glasses/contacts?: No Does the patient have difficulty concentrating, remembering, or making decisions?: No Does the patient have difficulty walking or climbing stairs?: No Does the patient have difficulty dressing or bathing?: No Does the patient have difficulty doing errands alone such as visiting a doctor's office or shopping?: No    Assessment & Plan  1. Type 2 diabetes  mellitus with microalbuminuria, without long-term current use of insulin (HCC)  - POCT HgB A1C  2. Amputated finger, sequela (HCC)  Stable  3. Hypothyroidism, postop  Continue medication  4. Dyslipidemia  On statin therapy   5. Benign essential HTN  Bring medication for review   6. History of thyroidectomy, total

## 2023-05-30 ENCOUNTER — Encounter: Payer: Self-pay | Admitting: Family Medicine

## 2023-05-30 ENCOUNTER — Ambulatory Visit (INDEPENDENT_AMBULATORY_CARE_PROVIDER_SITE_OTHER): Payer: BC Managed Care – PPO | Admitting: Family Medicine

## 2023-05-30 VITALS — BP 124/70 | HR 63 | Temp 98.0°F | Resp 18 | Ht 69.0 in | Wt 228.3 lb

## 2023-05-30 DIAGNOSIS — E890 Postprocedural hypothyroidism: Secondary | ICD-10-CM

## 2023-05-30 DIAGNOSIS — I10 Essential (primary) hypertension: Secondary | ICD-10-CM

## 2023-05-30 DIAGNOSIS — Z7984 Long term (current) use of oral hypoglycemic drugs: Secondary | ICD-10-CM | POA: Diagnosis not present

## 2023-05-30 DIAGNOSIS — E1129 Type 2 diabetes mellitus with other diabetic kidney complication: Secondary | ICD-10-CM | POA: Diagnosis not present

## 2023-05-30 DIAGNOSIS — R809 Proteinuria, unspecified: Secondary | ICD-10-CM

## 2023-05-30 DIAGNOSIS — S68119S Complete traumatic metacarpophalangeal amputation of unspecified finger, sequela: Secondary | ICD-10-CM | POA: Diagnosis not present

## 2023-05-30 DIAGNOSIS — E785 Hyperlipidemia, unspecified: Secondary | ICD-10-CM | POA: Diagnosis not present

## 2023-05-30 LAB — POCT GLYCOSYLATED HEMOGLOBIN (HGB A1C): Hemoglobin A1C: 6.6 % — AB (ref 4.0–5.6)

## 2023-05-31 NOTE — Addendum Note (Signed)
Addended by: Dollene Primrose on: 05/31/2023 07:56 AM   Modules accepted: Orders

## 2023-06-23 NOTE — Progress Notes (Signed)
Name: Samantha Copeland   MRN: [id number]    DOB: 05-07-1961   Date:06/26/2023       Progress Note  Subjective  Chief Complaint  Annual Exam  HPI  Patient presents for annual CPE.  Diet: balanced diet, likes salads  Exercise:discussed importance of regular physical activity    Last Eye Exam: up to date  Last Dental Exam: up to date   Flowsheet Row Office Visit from 06/26/2023 in [facility]  AUDIT-C Score 1      Depression: Phq 9 is  negative    06/26/2023    8:00 AM 05/30/2023    8:23 AM 02/20/2023   10:22 AM 11/29/2022    2:24 PM 11/14/2022    9:01 AM  Depression screen PHQ 2/9  Decreased Interest 0 0 0 0 0  Down, Depressed, Hopeless 0 0 0 0 0  PHQ - 2 Score 0 0 0 0 0  Altered sleeping 0 0 0 0 0  Tired, decreased energy 0 0 0 0 0  Change in appetite 0 0 0 0 0  Feeling bad or failure about yourself  0 0 0 0 0  Trouble concentrating 0 0 0 0 0  Moving slowly or fidgety/restless 0 0 0 0 0  Suicidal thoughts 0 0 0 0 0  PHQ-9 Score 0 0 0 0 0   Hypertension: BP Readings from Last 3 Encounters:  06/26/23 134/76  05/30/23 124/70  03/06/23 126/70   Obesity: Wt Readings from Last 3 Encounters:  06/26/23 229 lb (103.9 kg)  05/30/23 228 lb 4.8 oz (103.6 kg)  02/20/23 226 lb 9.6 oz (102.8 kg)   BMI Readings from Last 3 Encounters:  06/26/23 33.82 kg/m  05/30/23 33.71 kg/m  02/20/23 34.97 kg/m     Vaccines:   RSV: discussed with patient  Tdap: up to date Shingrix: up to date Pneumonia: up to date Flu: up to date COVID-19: up to date   Hep C Screening: 07/22/13 STD testing and prevention (HIV/chl/gon/syphilis): 08/24/20 Intimate partner violence: negative screen  Sexual History : one partner, no pain  Menstrual History/LMP/Abnormal Bleeding: hysterectomy  Discussed importance of follow up if any post-menopausal bleeding: yes  Incontinence Symptoms: negative for symptoms   Breast cancer:  - Last Mammogram: 08/22/22 - BRCA gene  screening: N/A  Osteoporosis Prevention : Discussed high calcium and vitamin D supplementation, weight bearing exercises Bone density: N/A   Cervical cancer screening: N/A  Skin cancer: Discussed monitoring for atypical lesions  Colorectal cancer: 07/11/22   Lung cancer:  Low Dose CT Chest recommended if Age 34-80 years, 20 pack-year currently smoking OR have quit w/in 15years. Patient does not qualify for screen   ECG: 01/10/17  Advanced Care Planning: A voluntary discussion about advance care planning including the explanation and discussion of advance directives.  Discussed health care proxy and Living will, and the patient was able to identify a health care proxy as mother and sister .  Patient does not have a living will and power of attorney of health care   Lipids: Lab Results  Component Value Date   CHOL 181 08/16/2022   CHOL 145 09/14/2021   CHOL 140 08/24/2020   Lab Results  Component Value Date   HDL 108 08/16/2022   HDL 95 09/14/2021   HDL 88 08/24/2020   Lab Results  Component Value Date   LDLCALC 59 08/16/2022   LDLCALC 37 09/14/2021   LDLCALC 40 08/24/2020   Lab Results  Component  Value Date   TRIG 60 08/16/2022   TRIG 58 09/14/2021   TRIG 50 08/24/2020   Lab Results  Component Value Date   CHOLHDL 1.7 08/16/2022   CHOLHDL 1.5 09/14/2021   CHOLHDL 1.6 08/24/2020   No results found for: "LDLDIRECT"  Glucose: Glucose, Bld  Date Value Ref Range Status  08/16/2022 125 (H) 65 - 99 mg/dL Final    Comment:    .            Fasting reference interval . For someone without known diabetes, a glucose value between 100 and 125 mg/dL is consistent with prediabetes and should be confirmed with a follow-up test. .   09/14/2021 92 65 - 139 mg/dL Final    Comment:    .        Non-fasting reference interval .   08/24/2020 88 65 - 99 mg/dL Final    Comment:    .            Fasting reference interval .    Glucose-Capillary  Date Value Ref Range  Status  07/11/2022 137 (H) 70 - 99 mg/dL Final    Comment:    Glucose reference range applies only to samples taken after fasting for at least 8 hours.  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) 05/30/2015   History of thyroidectomy, total 05/30/2015   Microalbuminuria 05/30/2015   Morbid obesity (HCC) 05/30/2015    Past Surgical History:  Procedure Laterality Date   ABDOMINAL HYSTERECTOMY     AMPUTATION FINGER Right 1963   Index Finger after injury as a infant   BREAST BIOPSY Right 09/02/2021   Affirm bx-calcs"coil" clip-path pending   BREAST EXCISIONAL BIOPSY Right    benign   COLONOSCOPY WITH PROPOFOL N/A 07/11/2022   Procedure: COLONOSCOPY WITH PROPOFOL;  Surgeon: Wyline Mood, MD;  Location: [facility] ENDOSCOPY;  Service: Gastroenterology;  Laterality: N/A;   DORSAL COMPARTMENT RELEASE Left 01/18/2017   Procedure: RELEASE DORSAL COMPARTMENT (DEQUERVAIN);  Surgeon: Donato Heinz, MD;  Location: [facility] ORS;  Service: Orthopedics;  Laterality: Left;   FINE NEEDLE ASPIRATION  10/01/2013   Thyroid-Dr. Tedd Sias   THYROIDECTOMY N/A 12/09/2015   Procedure: THYROIDECTOMY;  Surgeon: Geanie Logan, MD;  Location: [facility] ORS;  Service: ENT;  Laterality: N/A;    Family History  Problem Relation Age of Onset   Diabetes Mother    Diabetes Father    Hypertension Father    Hyperlipidemia Father     Cancer Father    Diabetes Sister    Breast cancer Neg Hx     Social History   Socioeconomic History   Marital status: Married    Spouse name: Karren Burly   Number of children: 3   Years of education: Not on file   Highest education level: 12th grade  Occupational History   Occupation: Holiday representative: [id number]    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  Tobacco Use   Smoking status: Never   Smokeless tobacco: Never  Vaping Use   Vaping status: Never Used  Substance and Sexual Activity   Alcohol use: Yes    Alcohol/week: 0.0 standard drinks of alcohol    Comment: occasionally   Drug use: No   Sexual activity: Yes    Partners: Male    Birth control/protection: Surgical  Other Topics Concern   Not on file  Social History Narrative   Lives with husband   Three grown children    One grandson    Social Determinants of Health   Financial Resource Strain: Low Risk  (06/26/2023)   Overall Financial Resource Strain (CARDIA)    Difficulty of Paying Living Expenses: Not hard at all  Food Insecurity: No Food Insecurity (06/26/2023)   Hunger Vital Sign    Worried About Running Out of Food in the Last Year: Never true    Ran Out of Food in the Last Year: Never true  Transportation Needs: No Transportation Needs (06/26/2023)   PRAPARE - Administrator, Civil Service (Medical): No    Lack of Transportation (Non-Medical): No  Physical Activity: Inactive (06/26/2023)   Exercise Vital Sign    Days of Exercise per Week: 0 days    Minutes of Exercise per Session: 0 min  Stress: No Stress Concern Present (06/26/2023)   [employer] of Occupational Health - Occupational Stress Questionnaire    Feeling of Stress : Not at all  Social Connections: Moderately Isolated (06/26/2023)   Social Connection and Isolation Panel [NHANES]    Frequency of Communication with Friends and Family: More than three times a week    Frequency of Social Gatherings with Friends and Family: Three times a week     Attends Religious Services: Never    Active Member of Clubs or Organizations: No    Attends Banker Meetings: Never    Marital Status: Married  Catering manager Violence: Not At Risk (06/26/2023)   Humiliation, Afraid, Rape, and Kick questionnaire    Fear of Current or Ex-Partner: No    Emotionally Abused: No    Physically Abused: No    Sexually Abused: No     Current Outpatient Medications:    aspirin EC 81 MG tablet, Take 81 mg by mouth every evening. , Disp: , Rfl:    atorvastatin (LIPITOR) 40 MG tablet, Take 1 tablet (40 mg total) by mouth daily., Disp: 90 tablet, Rfl: 1   Cholecalciferol (D3 5000) 125 MCG (5000 UT) capsule, Take 5,000 Units by mouth daily., Disp: , Rfl:    Dapagliflozin Pro-metFORMIN ER (XIGDUO XR) 04-999 MG TB24, Take 2 tablets by mouth daily., Disp: 180 tablet, Rfl: 1   levothyroxine (SYNTHROID) 100 MCG tablet, TAKE 1 TABLET BY MOUTH ONCE DAILY AND ONLY ONE-HALF TABLET ON SUNDAYS, Disp: 94 tablet, Rfl: 1  No Known Allergies   ROS  Constitutional: Negative for fever or weight change.  Respiratory: Negative for cough and shortness of breath.   Cardiovascular: Negative for chest pain or palpitations.  Gastrointestinal: Negative for abdominal pain, no bowel changes.  Musculoskeletal: Negative for gait problem or joint swelling.  Skin: Negative for rash.  Neurological: Negative for dizziness or headache.  No other specific complaints in a complete review of systems (except as listed in HPI above).   Objective   Vitals:   06/26/23 0836  BP: 134/76  Pulse: 72  Resp: 16  SpO2: 99%  Weight: 229 lb (103.9 kg)  Height: 5\' 9"  (1.753 m)    Body mass index is 33.82 kg/m.  Physical Exam  Constitutional: Patient appears well-developed and well-nourished. No distress.  HENT: Head: Normocephalic and atraumatic. Ears: B TMs ok, no erythema or effusion; Nose: Nose normal. Mouth/Throat: Oropharynx is clear and moist. No oropharyngeal exudate.   Eyes: Conjunctivae and EOM are normal. Pupils are equal, round, and reactive to light. No scleral icterus.  Neck: Normal range of motion. Neck supple. No JVD present. No thyromegaly present.  Cardiovascular: Normal rate, regular rhythm and normal heart sounds.  No murmur heard. No BLE edema. Pulmonary/Chest: Effort normal and breath sounds normal. No respiratory distress. Abdominal: Soft. Bowel sounds are normal, no distension. There is no tenderness. no masses Breast: no lumps or masses, no nipple discharge or rashes FEMALE GENITALIA:  Not done  RECTAL: not done  Musculoskeletal: Normal range of motion, no joint effusions. No gross deformities Neurological: he is alert and oriented to person, place, and time. No cranial nerve deficit. Coordination, balance, strength, speech and gait are normal.  Skin: Skin is warm and dry. No rash noted. No erythema.  Psychiatric: Patient has a normal mood and affect. behavior is normal. Judgment and thought content normal.   Recent Results (from the past 2160 hour(s))  HM DIABETES EYE EXAM     Status: None   Collection Time: 05/22/23 12:00 AM  Result Value Ref Range   HM Diabetic Eye Exam No Retinopathy No Retinopathy  POCT HgB A1C     Status: Abnormal   Collection Time: 05/30/23  8:40 AM  Result Value Ref Range   Hemoglobin A1C 6.6 (A) 4.0 - 5.6 %   HbA1c POC (<> result, manual entry)     HbA1c, POC (prediabetic range)     HbA1c, POC (controlled diabetic range)       Fall Risk:    06/26/2023    8:00 AM 05/30/2023    8:23 AM 02/20/2023   10:11 AM 11/29/2022    2:24 PM 11/14/2022    9:01 AM  Fall Risk   Falls in the past year? 0 0 0 0 0  Number falls in past yr: 0   0 0  Injury with Fall? 0   0 0  Risk for fall due to : No Fall Risks No Fall Risks No Fall Risks No Fall Risks No Fall Risks  Follow up Falls prevention discussed Falls prevention discussed Falls prevention discussed Falls prevention discussed Falls prevention discussed      Functional Status Survey: Is the patient deaf or have difficulty hearing?: No Does the patient have difficulty seeing, even when wearing glasses/contacts?: No Does the patient have difficulty concentrating, remembering, or making decisions?: No Does the patient have difficulty walking or climbing stairs?: No Does the patient have difficulty dressing or bathing?: No Does the patient have difficulty doing errands alone such as visiting a doctor's office or shopping?: No   Assessment & Plan  1. Well adult exam  - Lipid panel - CBC with Differential/Platelet - COMPLETE METABOLIC PANEL WITH GFR - TSH  2. Long-term use of high-risk medication  - Lipid panel - CBC with Differential/Platelet - COMPLETE METABOLIC PANEL WITH GFR  3. Hypothyroidism, postop  - TSH  4. Dyslipidemia associated with type 2 diabetes mellitus (HCC)  - Lipid panel - COMPLETE METABOLIC PANEL WITH GFR  5. Breast cancer screening by mammogram  - MM 3D SCREENING MAMMOGRAM BILATERAL BREAST; Future    -USPSTF grade A and B recommendations reviewed with  patient; age-appropriate recommendations, preventive care, screening tests, etc discussed and encouraged; healthy living encouraged; see AVS for patient education given to patient -Discussed importance of 150 minutes of physical activity weekly, eat two servings of fish weekly, eat one serving of tree nuts ( cashews, pistachios, pecans, almonds.Marland Kitchen) every other day, eat 6 servings of fruit/vegetables daily and drink plenty of water and avoid sweet beverages.   -Reviewed Health Maintenance: Yes.

## 2023-06-26 ENCOUNTER — Encounter: Payer: Self-pay | Admitting: Family Medicine

## 2023-06-26 ENCOUNTER — Ambulatory Visit (INDEPENDENT_AMBULATORY_CARE_PROVIDER_SITE_OTHER): Payer: BC Managed Care – PPO | Admitting: Family Medicine

## 2023-06-26 VITALS — BP 134/76 | HR 72 | Resp 16 | Ht 69.0 in | Wt 229.0 lb

## 2023-06-26 DIAGNOSIS — Z79899 Other long term (current) drug therapy: Secondary | ICD-10-CM

## 2023-06-26 DIAGNOSIS — Z0000 Encounter for general adult medical examination without abnormal findings: Secondary | ICD-10-CM

## 2023-06-26 DIAGNOSIS — E890 Postprocedural hypothyroidism: Secondary | ICD-10-CM | POA: Diagnosis not present

## 2023-06-26 DIAGNOSIS — E785 Hyperlipidemia, unspecified: Secondary | ICD-10-CM | POA: Diagnosis not present

## 2023-06-26 DIAGNOSIS — E1169 Type 2 diabetes mellitus with other specified complication: Secondary | ICD-10-CM

## 2023-06-26 DIAGNOSIS — Z1231 Encounter for screening mammogram for malignant neoplasm of breast: Secondary | ICD-10-CM

## 2023-06-26 LAB — CBC WITH DIFFERENTIAL/PLATELET
Eosinophils Absolute: 142 cells/uL (ref 15–500)
Hemoglobin: 14.9 g/dL (ref 11.7–15.5)
MCH: 27.6 pg (ref 27.0–33.0)
MCHC: 32 g/dL (ref 32.0–36.0)
MCV: 86.3 fL (ref 80.0–100.0)
Monocytes Relative: 8.1 %
Neutro Abs: 3207 cells/uL (ref 1500–7800)
Neutrophils Relative %: 40.6 %
RBC: 5.39 10*6/uL — ABNORMAL HIGH (ref 3.80–5.10)
WBC: 7.9 10*3/uL (ref 3.8–10.8)

## 2023-06-26 NOTE — Patient Instructions (Signed)
RSV

## 2023-06-27 LAB — CBC WITH DIFFERENTIAL/PLATELET
Absolute Monocytes: 640 cells/uL (ref 200–950)
Basophils Absolute: 47 cells/uL (ref 0–200)
Basophils Relative: 0.6 %
Eosinophils Relative: 1.8 %
HCT: 46.5 % — ABNORMAL HIGH (ref 35.0–45.0)
Lymphs Abs: 3863 cells/uL (ref 850–3900)
MPV: 11 fL (ref 7.5–12.5)
Platelets: 242 10*3/uL (ref 140–400)
RDW: 15.1 % — ABNORMAL HIGH (ref 11.0–15.0)
Total Lymphocyte: 48.9 %

## 2023-06-27 LAB — COMPLETE METABOLIC PANEL WITH GFR
AG Ratio: 1.3 (calc) (ref 1.0–2.5)
ALT: 13 U/L (ref 6–29)
AST: 15 U/L (ref 10–35)
Albumin: 4.3 g/dL (ref 3.6–5.1)
Alkaline phosphatase (APISO): 89 U/L (ref 37–153)
BUN: 16 mg/dL (ref 7–25)
CO2: 29 mmol/L (ref 20–32)
Calcium: 9.4 mg/dL (ref 8.6–10.4)
Chloride: 102 mmol/L (ref 98–110)
Creat: 1.05 mg/dL (ref 0.50–1.05)
Globulin: 3.2 g/dL (calc) (ref 1.9–3.7)
Glucose, Bld: 96 mg/dL (ref 65–99)
Potassium: 4.6 mmol/L (ref 3.5–5.3)
Sodium: 140 mmol/L (ref 135–146)
Total Bilirubin: 0.5 mg/dL (ref 0.2–1.2)
Total Protein: 7.5 g/dL (ref 6.1–8.1)
eGFR: 60 mL/min/{1.73_m2} (ref >=60)

## 2023-06-27 LAB — LIPID PANEL
Cholesterol: 166 mg/dL (ref <200)
HDL: 108 mg/dL (ref >=50)
LDL Cholesterol (Calc): 46 mg/dL (calc)
Non-HDL Cholesterol (Calc): 58 mg/dL (calc) (ref <130)
Total CHOL/HDL Ratio: 1.5 (calc) (ref <5.0)
Triglycerides: 50 mg/dL (ref <150)

## 2023-06-27 LAB — TSH: TSH: 2.04 mIU/L (ref 0.40–4.50)

## 2023-08-07 NOTE — Progress Notes (Unsigned)
Name: Samantha Copeland   MRN: [id number]    DOB: 11-Jul-1961   Date:08/08/2023       Progress Note  Subjective  Chief Complaint  Acute visit for cough, fever   HPI  Acute cough: she states symptoms started with a cough 4 days ago, symptoms are getting worse, she has fever, chills, since yesterday fatigue, lack of appetite and nausea . She has also noticed a headache. No facial pressure, rhinorrhea, sore throat or rashes.    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) 05/30/2015   History of thyroidectomy, total 05/30/2015   Microalbuminuria 05/30/2015   Morbid obesity (HCC) 05/30/2015    Past Surgical History:  Procedure Laterality Date   ABDOMINAL HYSTERECTOMY     AMPUTATION FINGER Right 1963   Index Finger after injury as a infant   BREAST BIOPSY Right 09/02/2021   Affirm bx-calcs"coil" clip-path pending   BREAST EXCISIONAL BIOPSY Right    benign   COLONOSCOPY WITH PROPOFOL N/A 07/11/2022   Procedure: COLONOSCOPY WITH PROPOFOL;  Surgeon: Wyline Mood, MD;  Location: [facility] ENDOSCOPY;  Service: Gastroenterology;  Laterality: N/A;   DORSAL COMPARTMENT RELEASE Left 01/18/2017   Procedure: RELEASE DORSAL COMPARTMENT (DEQUERVAIN);  Surgeon: Donato Heinz, MD;  Location: [facility] ORS;  Service: Orthopedics;  Laterality: Left;   FINE NEEDLE ASPIRATION  10/01/2013   Thyroid-Dr. Tedd Sias   THYROIDECTOMY N/A 12/09/2015   Procedure: THYROIDECTOMY;  Surgeon: Geanie Logan,  MD;  Location: [facility] ORS;  Service: ENT;  Laterality: N/A;    Family History  Problem Relation Age of Onset   Diabetes Mother    Diabetes Father    Hypertension Father    Hyperlipidemia Father    Cancer Father    Diabetes Sister    Breast cancer Neg Hx     Social History   Tobacco Use   Smoking status: Never   Smokeless tobacco: Never  Substance Use Topics   Alcohol use: Yes    Alcohol/week: 0.0 standard drinks of alcohol    Comment: occasionally     Current Outpatient Medications:    aspirin EC 81 MG tablet, Take 81 mg by mouth every evening. , Disp: , Rfl:    atorvastatin (LIPITOR) 40 MG tablet, Take 1 tablet (40 mg total) by mouth daily., Disp: 90 tablet, Rfl: 1   Cholecalciferol (D3 5000) 125 MCG (5000 UT) capsule, Take 5,000 Units by mouth daily., Disp: , Rfl:    Dapagliflozin Pro-metFORMIN ER (XIGDUO XR) 04-999 MG TB24, Take 2 tablets by mouth daily., Disp: 180 tablet, Rfl: 1   levothyroxine (SYNTHROID) 100 MCG tablet, TAKE 1 TABLET BY MOUTH ONCE DAILY AND ONLY ONE-HALF TABLET ON SUNDAYS, Disp: 94 tablet, Rfl: 1  No Known Allergies  I personally reviewed active problem list, medication list, allergies with the patient/caregiver today.   ROS  Ten systems reviewed and is negative except as mentioned in HPI    Objective  Vitals:   08/08/23 0805  BP: 126/72  Pulse: 94  Resp: 16  Temp: 99.4  F (37.4 C)  TempSrc: Oral  SpO2: 100%  Weight: 226 lb 12.8 oz (102.9 kg)  Height: 5\' 9"  (1.753 m)    Body mass index is 33.49 kg/m.  Physical Exam  Constitutional: Patient appears well-developed and well-nourished. Obese  No distress.  HEENT: head atraumatic, normocephalic, pupils equal and reactive to light, ears normal TM , neck supple, throat within normal limits Cardiovascular: Normal rate, regular rhythm and normal heart sounds.  No murmur heard. No BLE edema. Pulmonary/Chest: Effort normal and breath sounds normal. No respiratory distress. Abdominal: Soft.   There is no tenderness. Psychiatric: Patient has a normal mood and affect. behavior is normal. Judgment and thought content normal.   Recent Results (from the past 2160 hour(s))  HM DIABETES EYE EXAM     Status: None   Collection Time: 05/22/23 12:00 AM  Result Value Ref Range   HM Diabetic Eye Exam No Retinopathy No Retinopathy  POCT HgB A1C     Status: Abnormal   Collection Time: 05/30/23  8:40 AM  Result Value Ref Range   Hemoglobin A1C 6.6 (A) 4.0 - 5.6 %   HbA1c POC (<> result, manual entry)     HbA1c, POC (prediabetic range)     HbA1c, POC (controlled diabetic range)    Lipid panel     Status: None   Collection Time: 06/26/23  8:51 AM  Result Value Ref Range   Cholesterol 166 <200 mg/dL   HDL 272 > OR = 50 mg/dL   Triglycerides 50 <536 mg/dL   LDL Cholesterol (Calc) 46 mg/dL (calc)    Comment: Reference range: <100 . Desirable range <100 mg/dL for primary prevention;   <70 mg/dL for patients with CHD or diabetic patients  with > or = 2 CHD risk factors. Marland Kitchen LDL-C is now calculated using the Martin-Hopkins  calculation, which is a validated novel method providing  better accuracy than the Friedewald equation in the  estimation of LDL-C.  Horald Pollen et al. Lenox Ahr. 6440;347(42): 2061-2068  (http://education.[facility].com/faq/FAQ164)    Total CHOL/HDL Ratio 1.5 <5.0 (calc)   Non-HDL Cholesterol (Calc) 58 <595 mg/dL (calc)    Comment: For patients with diabetes plus 1 major ASCVD risk  factor, treating to a non-HDL-C goal of <100 mg/dL  (LDL-C of <63 mg/dL) is considered a therapeutic  option.   CBC with Differential/Platelet     Status: Abnormal   Collection Time: 06/26/23  8:51 AM  Result Value Ref Range   WBC 7.9 3.8 - 10.8 Thousand/uL   RBC 5.39 (H) 3.80 - 5.10 Million/uL   Hemoglobin 14.9 11.7 - 15.5 g/dL   HCT 87.5 (H) 64.3 - 32.9 %   MCV 86.3 80.0 - 100.0 fL   MCH 27.6 27.0 - 33.0 pg   MCHC 32.0 32.0 - 36.0 g/dL   RDW 51.8 (H) 84.1 - 66.0 %   Platelets  242 140 - 400 Thousand/uL   MPV 11.0 7.5 - 12.5 fL   Neutro Abs 3,207 1,500 - 7,800 cells/uL   Lymphs Abs 3,863 850 - 3,900 cells/uL   Absolute Monocytes 640 200 - 950 cells/uL   Eosinophils Absolute 142 15 - 500 cells/uL   Basophils Absolute 47 0 - 200 cells/uL   Neutrophils Relative % 40.6 %   Total Lymphocyte 48.9 %   Monocytes Relative 8.1 %   Eosinophils Relative 1.8 %   Basophils Relative 0.6 %  COMPLETE METABOLIC PANEL WITH GFR     Status: None   Collection Time: 06/26/23  8:51  AM  Result Value Ref Range   Glucose, Bld 96 65 - 99 mg/dL    Comment: .            Fasting reference interval .    BUN 16 7 - 25 mg/dL   Creat 1.30 8.65 - 7.84 mg/dL   eGFR 60 > OR = 60 ON/GEX/5.28U1   BUN/Creatinine Ratio SEE NOTE: 6 - 22 (calc)    Comment:    Not Reported: BUN and Creatinine are within    reference range. .    Sodium 140 135 - 146 mmol/L   Potassium 4.6 3.5 - 5.3 mmol/L   Chloride 102 98 - 110 mmol/L   CO2 29 20 - 32 mmol/L   Calcium 9.4 8.6 - 10.4 mg/dL   Total Protein 7.5 6.1 - 8.1 g/dL   Albumin 4.3 3.6 - 5.1 g/dL   Globulin 3.2 1.9 - 3.7 g/dL (calc)   AG Ratio 1.3 1.0 - 2.5 (calc)   Total Bilirubin 0.5 0.2 - 1.2 mg/dL   Alkaline phosphatase (APISO) 89 37 - 153 U/L   AST 15 10 - 35 U/L   ALT 13 6 - 29 U/L  TSH     Status: None   Collection Time: 06/26/23  8:51 AM  Result Value Ref Range   TSH 2.04 0.40 - 4.50 mIU/L     PHQ2/9:    08/08/2023    8:08 AM 06/26/2023    8:00 AM 05/30/2023    8:23 AM 02/20/2023   10:22 AM 11/29/2022    2:24 PM  Depression screen PHQ 2/9  Decreased Interest 0 0 0 0 0  Down, Depressed, Hopeless 0 0 0 0 0  PHQ - 2 Score 0 0 0 0 0  Altered sleeping 2 0 0 0 0  Tired, decreased energy 2 0 0 0 0  Change in appetite 0 0 0 0 0  Feeling bad or failure about yourself  0 0 0 0 0  Trouble concentrating 0 0 0 0 0  Moving slowly or fidgety/restless 0 0 0 0 0  Suicidal thoughts 0 0 0 0 0  PHQ-9 Score 4 0 0 0 0  Difficult doing work/chores  Not difficult at all        phq 9 is negative   Fall Risk:    08/08/2023    8:08 AM 06/26/2023    8:00 AM 05/30/2023    8:23 AM 02/20/2023   10:11 AM 11/29/2022    2:24 PM  Fall Risk   Falls in the past year? 0 0 0 0 0  Number falls in past yr:  0   0  Injury with Fall?  0   0  Risk for fall due to : No Fall Risks No Fall Risks No Fall Risks No Fall Risks No Fall Risks  Follow up Falls prevention discussed Falls prevention discussed Falls prevention discussed Falls prevention discussed Falls prevention discussed    Functional Status Survey: Is the patient deaf or have difficulty hearing?: No Does the patient have difficulty seeing, even when wearing glasses/contacts?: No Does the patient have difficulty concentrating, remembering, or making decisions?: No Does the patient have difficulty walking or climbing stairs?: No Does the patient have difficulty dressing or bathing?: No Does the patient have difficulty doing errands alone such as visiting a doctor's office or shopping?: No    Assessment & Plan  1. Acute cough  - CBC with Differential/Platelet - COMPLETE METABOLIC PANEL WITH GFR - cefTRIAXone (  ROCEPHIN) injection 1 g - azithromycin (ZITHROMAX) 250 MG tablet; Take 2 tablets on day 1, then 1 tablet daily on days 2 through 5  Dispense: 6 tablet; Refill: 0 - benzonatate (TESSALON) 100 MG capsule; Take 1 capsule (100 mg total) by mouth in the morning, at noon, in the evening, and at bedtime.  Dispense: 40 capsule; Refill: 0  2. Other fatigue  - CBC with Differential/Platelet - COMPLETE METABOLIC PANEL WITH GFR  3. Nausea  - CBC with Differential/Platelet - COMPLETE METABOLIC PANEL WITH GFR - ondansetron (ZOFRAN) 4 MG tablet; Take 1 tablet (4 mg total) by mouth every 8 (eight) hours as needed for nausea or vomiting.  Dispense: 20 tablet; Refill: 0

## 2023-08-08 ENCOUNTER — Encounter: Payer: Self-pay | Admitting: Family Medicine

## 2023-08-08 ENCOUNTER — Ambulatory Visit
Admission: RE | Admit: 2023-08-08 | Discharge: 2023-08-08 | Disposition: A | Discharge status: Home / Self Care | Payer: BC Managed Care – PPO | Attending: Family Medicine | Admitting: Family Medicine

## 2023-08-08 ENCOUNTER — Ambulatory Visit (INDEPENDENT_AMBULATORY_CARE_PROVIDER_SITE_OTHER): Payer: BC Managed Care – PPO | Admitting: Family Medicine

## 2023-08-08 ENCOUNTER — Ambulatory Visit
Admission: RE | Admit: 2023-08-08 | Discharge: 2023-08-08 | Disposition: A | Discharge status: Home / Self Care | Payer: BC Managed Care – PPO | Source: Ambulatory Visit | Attending: Family Medicine | Admitting: Family Medicine

## 2023-08-08 VITALS — BP 126/72 | HR 94 | Temp 99.4°F | Resp 16 | Ht 69.0 in | Wt 226.8 lb

## 2023-08-08 DIAGNOSIS — R918 Other nonspecific abnormal finding of lung field: Secondary | ICD-10-CM | POA: Diagnosis not present

## 2023-08-08 DIAGNOSIS — R110 Nausea: Secondary | ICD-10-CM

## 2023-08-08 DIAGNOSIS — R051 Acute cough: Secondary | ICD-10-CM

## 2023-08-08 DIAGNOSIS — R5383 Other fatigue: Secondary | ICD-10-CM | POA: Diagnosis not present

## 2023-08-08 DIAGNOSIS — R059 Cough, unspecified: Secondary | ICD-10-CM | POA: Diagnosis not present

## 2023-08-08 DIAGNOSIS — J984 Other disorders of lung: Secondary | ICD-10-CM | POA: Diagnosis not present

## 2023-08-08 DIAGNOSIS — R0602 Shortness of breath: Secondary | ICD-10-CM | POA: Diagnosis not present

## 2023-08-08 MED ORDER — ONDANSETRON HCL 4 MG PO TABS: 4.0000 mg | ORAL_TABLET | Freq: Three times a day (TID) | ORAL | 0 refills | Status: DC | PRN

## 2023-08-08 MED ORDER — LIDOCAINE HCL (PF) 1 % IJ SOLN
2.0000 mL | Freq: Once | INTRAMUSCULAR | Status: AC
Start: 2023-08-08 — End: 2023-08-08
  Administered 2023-08-08: 2 mL via INTRADERMAL

## 2023-08-08 MED ORDER — BENZONATATE 100 MG PO CAPS: 100.0000 mg | ORAL_CAPSULE | Freq: Four times a day (QID) | ORAL | 0 refills | Status: DC

## 2023-08-08 MED ORDER — CEFTRIAXONE SODIUM 500 MG IJ SOLR
500.0000 mg | Freq: Once | INTRAMUSCULAR | Status: AC
  Administered 2023-08-08: 500 mg via INTRAMUSCULAR

## 2023-08-08 MED ORDER — AZITHROMYCIN 250 MG PO TABS
ORAL_TABLET | ORAL | 0 refills | Status: AC
Start: 2023-08-08 — End: 2023-08-13

## 2023-08-08 MED ORDER — CEFTRIAXONE SODIUM 1 G IJ SOLR
1.0000 g | Freq: Once | INTRAMUSCULAR | Status: DC
Start: 2023-08-08 — End: 2023-08-08

## 2023-08-08 MED ORDER — CEFTRIAXONE SODIUM 500 MG IJ SOLR
500.0000 mg | Freq: Once | INTRAMUSCULAR | Status: AC
Start: 2023-08-08 — End: 2023-08-08
  Administered 2023-08-08: 500 mg via INTRAMUSCULAR

## 2023-08-08 NOTE — Addendum Note (Signed)
Addended by: Forde Radon on: 08/08/2023 09:25 AM   Modules accepted: Orders

## 2023-08-08 NOTE — Addendum Note (Signed)
Addended by: Ruel Favors on: 08/08/2023 09:40 AM   Modules accepted: Orders

## 2023-08-08 NOTE — Progress Notes (Unsigned)
Name: Samantha Copeland   MRN: [id number]    DOB: 11-Mar-1961   Date:08/09/2023       Progress Note  Subjective  Chief Complaint  Follow up  HPI  CAP: she is here today for 24 hour check. She continues to cough but not as bad during the day but did not sleep well last night due to constant cough. Discussed CXR , explained importance of staying hydrated. We will add Augmentin today to current Azithromycin, and also cough syrup. 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) 05/30/2015   History of thyroidectomy, total 05/30/2015   Microalbuminuria 05/30/2015   Morbid obesity (HCC) 05/30/2015    Past Surgical History:  Procedure Laterality Date   ABDOMINAL HYSTERECTOMY     AMPUTATION FINGER Right 1963   Index Finger after injury as a infant   BREAST BIOPSY Right 09/02/2021   Affirm bx-calcs"coil" clip-path pending   BREAST EXCISIONAL BIOPSY Right    benign   COLONOSCOPY WITH PROPOFOL N/A 07/11/2022   Procedure: COLONOSCOPY WITH PROPOFOL;  Surgeon: Wyline Mood, MD;  Location: [facility] ENDOSCOPY;  Service: Gastroenterology;  Laterality: N/A;   DORSAL COMPARTMENT RELEASE Left 01/18/2017   Procedure: RELEASE DORSAL COMPARTMENT (DEQUERVAIN);  Surgeon: Donato Heinz, MD;  Location: [facility] ORS;  Service: Orthopedics;  Laterality: Left;    FINE NEEDLE ASPIRATION  10/01/2013   Thyroid-Dr. Tedd Sias   THYROIDECTOMY N/A 12/09/2015   Procedure: THYROIDECTOMY;  Surgeon: Geanie Logan, MD;  Location: [facility] ORS;  Service: ENT;  Laterality: N/A;    Family History  Problem Relation Age of Onset   Diabetes Mother    Diabetes Father    Hypertension Father    Hyperlipidemia Father    Cancer Father    Diabetes Sister    Breast cancer Neg Hx     Social History   Tobacco Use   Smoking status: Never   Smokeless tobacco: Never  Substance Use Topics   Alcohol use: Yes    Alcohol/week: 0.0 standard drinks of alcohol    Comment: occasionally     Current Outpatient Medications:    amoxicillin-clavulanate (AUGMENTIN) 875-125 MG tablet, Take 1 tablet by mouth 2 (two) times daily., Disp: 14 tablet, Rfl: 0   aspirin EC 81 MG tablet, Take 81 mg by mouth every evening. , Disp: , Rfl:    atorvastatin (LIPITOR) 40 MG tablet, Take 1 tablet (40 mg total) by mouth daily., Disp: 90 tablet, Rfl: 1   azithromycin (ZITHROMAX) 250 MG tablet, Take 2 tablets on day 1, then 1 tablet daily on days 2 through 5, Disp: 6 tablet, Rfl: 0   benzonatate (TESSALON) 100 MG capsule, Take 1 capsule (100 mg total) by mouth in the morning, at noon, in the evening, and at bedtime., Disp: 40 capsule, Rfl: 0   chlorpheniramine-HYDROcodone (TUSSIONEX) 10-8 MG/5ML, Take 5 mLs by mouth every 12 (  twelve) hours as needed for cough., Disp: 140 mL, Rfl: 0   Cholecalciferol (D3 5000) 125 MCG (5000 UT) capsule, Take 5,000 Units by mouth daily., Disp: , Rfl:    Dapagliflozin Pro-metFORMIN ER (XIGDUO XR) 04-999 MG TB24, Take 2 tablets by mouth daily., Disp: 180 tablet, Rfl: 1   levothyroxine (SYNTHROID) 100 MCG tablet, TAKE 1 TABLET BY MOUTH ONCE DAILY AND ONLY ONE-HALF TABLET ON SUNDAYS, Disp: 94 tablet, Rfl: 1   ondansetron (ZOFRAN) 4 MG tablet, Take 1 tablet (4 mg total) by mouth every 8 (eight) hours as needed for nausea or vomiting., Disp: 20 tablet, Rfl: 0  No Known  Allergies  I personally reviewed active problem list, medication list, allergies, family history with the patient/caregiver today.   ROS  Ten systems reviewed and is negative except as mentioned in HPI    Objective  Vitals:   08/09/23 1254  BP: 126/70  Pulse: 93  Resp: 18  Temp: 98.3 F (36.8 C)  TempSrc: Oral  SpO2: 99%  Weight: 226 lb (102.5 kg)  Height: 5\' 9"  (1.753 m)    Body mass index is 33.37 kg/m.  Physical Exam  Constitutional: Patient appears well-developed and well-nourished. Obese  No distress.  HEENT: head atraumatic, normocephalic, pupils equal and reactive to light, neck supple Cardiovascular: Normal rate, regular rhythm and normal heart sounds.  No murmur heard. No BLE edema. Pulmonary/Chest: Effort normal and breath sounds normal. mild tachypnea, slight cough during exam  Abdominal: Soft.  There is no tenderness. Psychiatric: Patient has a normal mood and affect. behavior is normal. Judgment and thought content normal.    Recent Results (from the past 2160 hour(s))  HM DIABETES EYE EXAM     Status: None   Collection Time: 05/22/23 12:00 AM  Result Value Ref Range   HM Diabetic Eye Exam No Retinopathy No Retinopathy  POCT HgB A1C     Status: Abnormal   Collection Time: 05/30/23  8:40 AM  Result Value Ref Range   Hemoglobin A1C 6.6 (A) 4.0 - 5.6 %   HbA1c POC (<> result, manual entry)     HbA1c, POC (prediabetic range)     HbA1c, POC (controlled diabetic range)    Lipid panel     Status: None   Collection Time: 06/26/23  8:51 AM  Result Value Ref Range   Cholesterol 166 <200 mg/dL   HDL 161 > OR = 50 mg/dL   Triglycerides 50 <096 mg/dL   LDL Cholesterol (Calc) 46 mg/dL (calc)    Comment: Reference range: <100 . Desirable range <100 mg/dL for primary prevention;   <70 mg/dL for patients with CHD or diabetic patients  with > or = 2 CHD risk factors. Marland Kitchen LDL-C is now calculated using the Martin-Hopkins  calculation, which is a validated novel  method providing  better accuracy than the Friedewald equation in the  estimation of LDL-C.  Horald Pollen et al. Lenox Ahr. 0454;098(11): 2061-2068  (http://education.[facility].com/faq/FAQ164)    Total CHOL/HDL Ratio 1.5 <5.0 (calc)   Non-HDL Cholesterol (Calc) 58 <914 mg/dL (calc)    Comment: For patients with diabetes plus 1 major ASCVD risk  factor, treating to a non-HDL-C goal of <100 mg/dL  (LDL-C of <78 mg/dL) is considered a therapeutic  option.   CBC with Differential/Platelet     Status: Abnormal   Collection Time: 06/26/23  8:51 AM  Result Value Ref Range   WBC 7.9 3.8 - 10.8 Thousand/uL   RBC 5.39 (H) 3.80 - 5.10 Million/uL  Hemoglobin 14.9 11.7 - 15.5 g/dL   HCT 62.9 (H) 52.8 - 41.3 %   MCV 86.3 80.0 - 100.0 fL   MCH 27.6 27.0 - 33.0 pg   MCHC 32.0 32.0 - 36.0 g/dL   RDW 24.4 (H) 01.0 - 27.2 %   Platelets 242 140 - 400 Thousand/uL   MPV 11.0 7.5 - 12.5 fL   Neutro Abs 3,207 1,500 - 7,800 cells/uL   Lymphs Abs 3,863 850 - 3,900 cells/uL   Absolute Monocytes 640 200 - 950 cells/uL   Eosinophils Absolute 142 15 - 500 cells/uL   Basophils Absolute 47 0 - 200 cells/uL   Neutrophils Relative % 40.6 %   Total Lymphocyte 48.9 %   Monocytes Relative 8.1 %   Eosinophils Relative 1.8 %   Basophils Relative 0.6 %  COMPLETE METABOLIC PANEL WITH GFR     Status: None   Collection Time: 06/26/23  8:51 AM  Result Value Ref Range   Glucose, Bld 96 65 - 99 mg/dL    Comment: .            Fasting reference interval .    BUN 16 7 - 25 mg/dL   Creat 5.36 6.44 - 0.34 mg/dL   eGFR 60 > OR = 60 VQ/QVZ/5.63O7   BUN/Creatinine Ratio SEE NOTE: 6 - 22 (calc)    Comment:    Not Reported: BUN and Creatinine are within    reference range. .    Sodium 140 135 - 146 mmol/L   Potassium 4.6 3.5 - 5.3 mmol/L   Chloride 102 98 - 110 mmol/L   CO2 29 20 - 32 mmol/L   Calcium 9.4 8.6 - 10.4 mg/dL   Total Protein 7.5 6.1 - 8.1 g/dL   Albumin 4.3 3.6 - 5.1 g/dL   Globulin 3.2 1.9 - 3.7  g/dL (calc)   AG Ratio 1.3 1.0 - 2.5 (calc)   Total Bilirubin 0.5 0.2 - 1.2 mg/dL   Alkaline phosphatase (APISO) 89 37 - 153 U/L   AST 15 10 - 35 U/L   ALT 13 6 - 29 U/L  TSH     Status: None   Collection Time: 06/26/23  8:51 AM  Result Value Ref Range   TSH 2.04 0.40 - 4.50 mIU/L  CBC with Differential/Platelet     Status: Abnormal   Collection Time: 08/08/23  8:43 AM  Result Value Ref Range   WBC 13.4 (H) 3.8 - 10.8 Thousand/uL   RBC 5.49 (H) 3.80 - 5.10 Million/uL   Hemoglobin 15.6 (H) 11.7 - 15.5 g/dL   HCT 56.4 (H) 33.2 - 95.1 %   MCV 84.9 80.0 - 100.0 fL   MCH 28.4 27.0 - 33.0 pg   MCHC 33.5 32.0 - 36.0 g/dL   RDW 88.4 (H) 16.6 - 06.3 %   Platelets 171 140 - 400 Thousand/uL   MPV 11.5 7.5 - 12.5 fL   Neutro Abs 11,122 (H) 1,500 - 7,800 cells/uL   Lymphs Abs 1,327 850 - 3,900 cells/uL   Absolute Monocytes 938 200 - 950 cells/uL   Eosinophils Absolute 0 (L) 15 - 500 cells/uL   Basophils Absolute 13 0 - 200 cells/uL   Neutrophils Relative % 83 %   Total Lymphocyte 9.9 %   Monocytes Relative 7.0 %   Eosinophils Relative 0.0 %   Basophils Relative 0.1 %  COMPLETE METABOLIC PANEL WITH GFR     Status: Abnormal   Collection Time: 08/08/23  8:43 AM  Result Value  Ref Range   Glucose, Bld 156 (H) 65 - 99 mg/dL    Comment: .            Fasting reference interval . For someone without known diabetes, a glucose value >125 mg/dL indicates that they may have diabetes and this should be confirmed with a follow-up test. .    BUN 19 7 - 25 mg/dL   Creat 6.96 (H) 2.95 - 1.05 mg/dL   eGFR 43 (L) > OR = 60 mL/min/1.64m2   BUN/Creatinine Ratio 14 6 - 22 (calc)   Sodium 136 135 - 146 mmol/L   Potassium 4.0 3.5 - 5.3 mmol/L   Chloride 100 98 - 110 mmol/L   CO2 22 20 - 32 mmol/L   Calcium 9.1 8.6 - 10.4 mg/dL   Total Protein 7.3 6.1 - 8.1 g/dL   Albumin 3.7 3.6 - 5.1 g/dL   Globulin 3.6 1.9 - 3.7 g/dL (calc)   AG Ratio 1.0 1.0 - 2.5 (calc)   Total Bilirubin 0.8 0.2 - 1.2 mg/dL    Alkaline phosphatase (APISO) 77 37 - 153 U/L   AST 25 10 - 35 U/L   ALT 21 6 - 29 U/L    PHQ2/9:    08/08/2023    8:08 AM 06/26/2023    8:00 AM 05/30/2023    8:23 AM 02/20/2023   10:22 AM 11/29/2022    2:24 PM  Depression screen PHQ 2/9  Decreased Interest 0 0 0 0 0  Down, Depressed, Hopeless 0 0 0 0 0  PHQ - 2 Score 0 0 0 0 0  Altered sleeping 2 0 0 0 0  Tired, decreased energy 2 0 0 0 0  Change in appetite 0 0 0 0 0  Feeling bad or failure about yourself  0 0 0 0 0  Trouble concentrating 0 0 0 0 0  Moving slowly or fidgety/restless 0 0 0 0 0  Suicidal thoughts 0 0 0 0 0  PHQ-9 Score 4 0 0 0 0  Difficult doing work/chores Not difficult at all        phq 9 is currently not feeling well due to illness    Fall Risk:    08/08/2023    8:08 AM 06/26/2023    8:00 AM 05/30/2023    8:23 AM 02/20/2023   10:11 AM 11/29/2022    2:24 PM  Fall Risk   Falls in the past year? 0 0 0 0 0  Number falls in past yr:  0   0  Injury with Fall?  0   0  Risk for fall due to : No Fall Risks No Fall Risks No Fall Risks No Fall Risks No Fall Risks  Follow up Falls prevention discussed Falls prevention discussed Falls prevention discussed Falls prevention discussed Falls prevention discussed     Assessment & Plan  1. Community acquired pneumonia of left lower lobe of lung  - DG Chest 2 View; Future - amoxicillin-clavulanate (AUGMENTIN) 875-125 MG tablet; Take 1 tablet by mouth 2 (two) times daily.  Dispense: 14 tablet; Refill: 0 - chlorpheniramine-HYDROcodone (TUSSIONEX) 10-8 MG/5ML; Take 5 mLs by mouth every 12 (twelve) hours as needed for cough.  Dispense: 140 mL; Refill: 0

## 2023-08-09 ENCOUNTER — Encounter: Payer: Self-pay | Admitting: Family Medicine

## 2023-08-09 ENCOUNTER — Ambulatory Visit (INDEPENDENT_AMBULATORY_CARE_PROVIDER_SITE_OTHER): Payer: BC Managed Care – PPO | Admitting: Family Medicine

## 2023-08-09 VITALS — BP 126/70 | HR 93 | Temp 98.3°F | Resp 18 | Ht 69.0 in | Wt 226.0 lb

## 2023-08-09 DIAGNOSIS — J189 Pneumonia, unspecified organism: Secondary | ICD-10-CM

## 2023-08-09 LAB — CBC WITH DIFFERENTIAL/PLATELET
Absolute Monocytes: 938 {cells}/uL (ref 200–950)
Basophils Absolute: 13 cells/uL (ref 0–200)
Basophils Relative: 0.1 %
Eosinophils Absolute: 0 {cells}/uL — ABNORMAL LOW (ref 15–500)
Eosinophils Relative: 0 %
HCT: 46.6 % — ABNORMAL HIGH (ref 35.0–45.0)
Hemoglobin: 15.6 g/dL — ABNORMAL HIGH (ref 11.7–15.5)
Lymphs Abs: 1327 {cells}/uL (ref 850–3900)
MCH: 28.4 pg (ref 27.0–33.0)
MCHC: 33.5 g/dL (ref 32.0–36.0)
MCV: 84.9 fL (ref 80.0–100.0)
MPV: 11.5 fL (ref 7.5–12.5)
Monocytes Relative: 7 %
Neutro Abs: 11122 {cells}/uL — ABNORMAL HIGH (ref 1500–7800)
Neutrophils Relative %: 83 %
Platelets: 171 10*3/uL (ref 140–400)
RBC: 5.49 10*6/uL — ABNORMAL HIGH (ref 3.80–5.10)
RDW: 15.4 % — ABNORMAL HIGH (ref 11.0–15.0)
Total Lymphocyte: 9.9 %
WBC: 13.4 10*3/uL — ABNORMAL HIGH (ref 3.8–10.8)

## 2023-08-09 LAB — COMPLETE METABOLIC PANEL WITH GFR
AG Ratio: 1 (calc) (ref 1.0–2.5)
ALT: 21 U/L (ref 6–29)
AST: 25 U/L (ref 10–35)
Albumin: 3.7 g/dL (ref 3.6–5.1)
Alkaline phosphatase (APISO): 77 U/L (ref 37–153)
BUN/Creatinine Ratio: 14 (calc) (ref 6–22)
BUN: 19 mg/dL (ref 7–25)
CO2: 22 mmol/L (ref 20–32)
Calcium: 9.1 mg/dL (ref 8.6–10.4)
Chloride: 100 mmol/L (ref 98–110)
Creat: 1.4 mg/dL — ABNORMAL HIGH (ref 0.50–1.05)
Globulin: 3.6 g/dL (ref 1.9–3.7)
Glucose, Bld: 156 mg/dL — ABNORMAL HIGH (ref 65–99)
Potassium: 4 mmol/L (ref 3.5–5.3)
Sodium: 136 mmol/L (ref 135–146)
Total Bilirubin: 0.8 mg/dL (ref 0.2–1.2)
Total Protein: 7.3 g/dL (ref 6.1–8.1)
eGFR: 43 mL/min/{1.73_m2} — ABNORMAL LOW (ref >=60)

## 2023-08-09 MED ORDER — HYDROCOD POLI-CHLORPHE POLI ER 10-8 MG/5ML PO SUER
5.0000 mL | Freq: Two times a day (BID) | ORAL | 0 refills | Status: DC | PRN
Start: 2023-08-09 — End: 2023-09-25

## 2023-08-09 MED ORDER — AMOXICILLIN-POT CLAVULANATE 875-125 MG PO TABS
1.0000 | ORAL_TABLET | Freq: Two times a day (BID) | ORAL | 0 refills | Status: DC
Start: 2023-08-09 — End: 2023-09-25

## 2023-08-11 ENCOUNTER — Encounter: Payer: Self-pay | Admitting: Family Medicine

## 2023-08-11 ENCOUNTER — Ambulatory Visit (INDEPENDENT_AMBULATORY_CARE_PROVIDER_SITE_OTHER): Payer: BC Managed Care – PPO | Admitting: Family Medicine

## 2023-08-11 ENCOUNTER — Other Ambulatory Visit: Payer: Self-pay | Admitting: Family Medicine

## 2023-08-11 VITALS — BP 124/70 | HR 77 | Temp 97.9°F | Resp 18 | Ht 69.0 in | Wt 232.6 lb

## 2023-08-11 DIAGNOSIS — J189 Pneumonia, unspecified organism: Secondary | ICD-10-CM

## 2023-08-11 DIAGNOSIS — E890 Postprocedural hypothyroidism: Secondary | ICD-10-CM

## 2023-08-11 DIAGNOSIS — R197 Diarrhea, unspecified: Secondary | ICD-10-CM | POA: Diagnosis not present

## 2023-08-11 NOTE — Progress Notes (Signed)
Name: Samantha Copeland   MRN: [id number]    DOB: 09-18-1961   Date:08/11/2023       Progress Note  Subjective  Chief Complaint  CAP follow up   HPI   CAP: she was seen on 08/27 for acute onset of cough , fever, chills and fatigue. CXR showed CAP , she had one dose of Rocephin on 08/27 , she took azithromycin and must have taken two tablets for two days since she finished antibiotics this am. She is taking Augmentin , she noticed loose stools since yesterday. She is still having chills at night, appetite is still poor and still feels tired. She also states her cognition seems not to be great. We will keep her out of work until next week. 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) 05/30/2015   History of thyroidectomy, total 05/30/2015   Microalbuminuria 05/30/2015   Morbid obesity (HCC) 05/30/2015    Past Surgical History:  Procedure Laterality Date   ABDOMINAL HYSTERECTOMY     AMPUTATION FINGER Right 1963   Index Finger after injury as a infant   BREAST BIOPSY Right 09/02/2021   Affirm bx-calcs"coil" clip-path pending   BREAST EXCISIONAL BIOPSY Right    benign   COLONOSCOPY WITH PROPOFOL N/A 07/11/2022   Procedure: COLONOSCOPY WITH PROPOFOL;  Surgeon: Wyline Mood, MD;  Location: [facility] ENDOSCOPY;  Service: Gastroenterology;  Laterality: N/A;   DORSAL COMPARTMENT RELEASE Left 01/18/2017   Procedure: RELEASE  DORSAL COMPARTMENT (DEQUERVAIN);  Surgeon: Donato Heinz, MD;  Location: [facility] ORS;  Service: Orthopedics;  Laterality: Left;   FINE NEEDLE ASPIRATION  10/01/2013   Thyroid-Dr. Tedd Sias   THYROIDECTOMY N/A 12/09/2015   Procedure: THYROIDECTOMY;  Surgeon: Geanie Logan, MD;  Location: [facility] ORS;  Service: ENT;  Laterality: N/A;    Family History  Problem Relation Age of Onset   Diabetes Mother    Diabetes Father    Hypertension Father    Hyperlipidemia Father    Cancer Father    Diabetes Sister    Breast cancer Neg Hx     Social History   Tobacco Use   Smoking status: Never   Smokeless tobacco: Never  Substance Use Topics   Alcohol use: Yes    Alcohol/week: 0.0 standard drinks of alcohol    Comment: occasionally     Current Outpatient Medications:    amoxicillin-clavulanate (AUGMENTIN) 875-125 MG tablet, Take 1 tablet by mouth 2 (two) times daily., Disp: 14 tablet, Rfl: 0   aspirin EC 81 MG tablet, Take 81 mg by mouth every evening. , Disp: , Rfl:    atorvastatin (LIPITOR) 40 MG tablet, Take 1 tablet (40 mg total) by mouth daily., Disp: 90 tablet, Rfl: 1   azithromycin (ZITHROMAX) 250 MG tablet, Take 2 tablets on day 1, then 1 tablet daily on days 2 through 5, Disp: 6 tablet, Rfl: 0   benzonatate (TESSALON) 100 MG capsule, Take 1 capsule (100 mg total) by mouth in  the morning, at noon, in the evening, and at bedtime., Disp: 40 capsule, Rfl: 0   chlorpheniramine-HYDROcodone (TUSSIONEX) 10-8 MG/5ML, Take 5 mLs by mouth every 12 (twelve) hours as needed for cough., Disp: 140 mL, Rfl: 0   Cholecalciferol (D3 5000) 125 MCG (5000 UT) capsule, Take 5,000 Units by mouth daily., Disp: , Rfl:    Dapagliflozin Pro-metFORMIN ER (XIGDUO XR) 04-999 MG TB24, Take 2 tablets by mouth daily., Disp: 180 tablet, Rfl: 1   levothyroxine (SYNTHROID) 100 MCG tablet, TAKE 1 TABLET BY MOUTH ONCE DAILY AND ONLY ONE-HALF TABLET ON SUNDAYS, Disp: 94 tablet, Rfl: 1   ondansetron (ZOFRAN) 4 MG tablet, Take 1  tablet (4 mg total) by mouth every 8 (eight) hours as needed for nausea or vomiting., Disp: 20 tablet, Rfl: 0  No Known Allergies  I personally reviewed active problem list, medication list, allergies, family history with the patient/caregiver today.   ROS  Ten systems reviewed and is negative except as mentioned in HPI    Objective   Vitals:   08/11/23 1338  BP: 124/70  Pulse: 77  Resp: 18  Temp: 97.9 F (36.6 C)  TempSrc: Oral  SpO2: 98%  Weight: 232 lb 9.6 oz (105.5 kg)  Height: 5\' 9"  (1.753 m)    Body mass index is 34.35 kg/m.  Physical Exam  Constitutional: Patient appears well-developed and well-nourished. Obese  No distress.  HEENT: head atraumatic, normocephalic, pupils equal and reactive to light, neck supple Cardiovascular: Normal rate, regular rhythm and normal heart sounds.  No murmur heard. No BLE edema. Pulmonary/Chest: Effort normal and breath sounds normal. No respiratory distress. Abdominal: Soft.  There is no tenderness. Psychiatric: Patient has a normal mood and affect. behavior is normal. Judgment and thought content normal.   Recent Results (from the past 2160 hour(s))  HM DIABETES EYE EXAM     Status: None   Collection Time: 05/22/23 12:00 AM  Result Value Ref Range   HM Diabetic Eye Exam No Retinopathy No Retinopathy  POCT HgB A1C     Status: Abnormal   Collection Time: 05/30/23  8:40 AM  Result Value Ref Range   Hemoglobin A1C 6.6 (A) 4.0 - 5.6 %   HbA1c POC (<> result, manual entry)     HbA1c, POC (prediabetic range)     HbA1c, POC (controlled diabetic range)    Lipid panel     Status: None   Collection Time: 06/26/23  8:51 AM  Result Value Ref Range   Cholesterol 166 <200 mg/dL   HDL 440 > OR = 50 mg/dL   Triglycerides 50 <347 mg/dL   LDL Cholesterol (Calc) 46 mg/dL (calc)    Comment: Reference range: <100 . Desirable range <100 mg/dL for primary prevention;   <70 mg/dL for patients with CHD or diabetic patients  with > or = 2  CHD risk factors. Marland Kitchen LDL-C is now calculated using the Martin-Hopkins  calculation, which is a validated novel method providing  better accuracy than the Friedewald equation in the  estimation of LDL-C.  Horald Pollen et al. Lenox Ahr. 4259;563(87): 2061-2068  (http://education.[facility].com/faq/FAQ164)    Total CHOL/HDL Ratio 1.5 <5.0 (calc)   Non-HDL Cholesterol (Calc) 58 <564 mg/dL (calc)    Comment: For patients with diabetes plus 1 major ASCVD risk  factor, treating to a non-HDL-C goal of <100 mg/dL  (LDL-C of <33 mg/dL) is considered a therapeutic  option.   CBC with Differential/Platelet     Status: Abnormal   Collection Time: 06/26/23  8:51 AM  Result Value Ref Range   WBC 7.9 3.8 - 10.8 Thousand/uL   RBC 5.39 (H) 3.80 - 5.10 Million/uL   Hemoglobin 14.9 11.7 - 15.5 g/dL   HCT 16.1 (H) 09.6 - 04.5 %   MCV 86.3 80.0 - 100.0 fL   MCH 27.6 27.0 - 33.0 pg   MCHC 32.0 32.0 - 36.0 g/dL   RDW 40.9 (H) 81.1 - 91.4 %   Platelets 242 140 - 400 Thousand/uL   MPV 11.0 7.5 - 12.5 fL   Neutro Abs 3,207 1,500 - 7,800 cells/uL   Lymphs Abs 3,863 850 - 3,900 cells/uL   Absolute Monocytes 640 200 - 950 cells/uL   Eosinophils Absolute 142 15 - 500 cells/uL   Basophils Absolute 47 0 - 200 cells/uL   Neutrophils Relative % 40.6 %   Total Lymphocyte 48.9 %   Monocytes Relative 8.1 %   Eosinophils Relative 1.8 %   Basophils Relative 0.6 %  COMPLETE METABOLIC PANEL WITH GFR     Status: None   Collection Time: 06/26/23  8:51 AM  Result Value Ref Range   Glucose, Bld 96 65 - 99 mg/dL    Comment: .            Fasting reference interval .    BUN 16 7 - 25 mg/dL   Creat 7.82 9.56 - 2.13 mg/dL   eGFR 60 > OR = 60 YQ/MVH/8.46N6   BUN/Creatinine Ratio SEE NOTE: 6 - 22 (calc)    Comment:    Not Reported: BUN and Creatinine are within    reference range. .    Sodium 140 135 - 146 mmol/L   Potassium 4.6 3.5 - 5.3 mmol/L   Chloride 102 98 - 110 mmol/L   CO2 29 20 - 32 mmol/L   Calcium 9.4  8.6 - 10.4 mg/dL   Total Protein 7.5 6.1 - 8.1 g/dL   Albumin 4.3 3.6 - 5.1 g/dL   Globulin 3.2 1.9 - 3.7 g/dL (calc)   AG Ratio 1.3 1.0 - 2.5 (calc)   Total Bilirubin 0.5 0.2 - 1.2 mg/dL   Alkaline phosphatase (APISO) 89 37 - 153 U/L   AST 15 10 - 35 U/L   ALT 13 6 - 29 U/L  TSH     Status: None   Collection Time: 06/26/23  8:51 AM  Result Value Ref Range   TSH 2.04 0.40 - 4.50 mIU/L  CBC with Differential/Platelet     Status: Abnormal   Collection Time: 08/08/23  8:43 AM  Result Value Ref Range   WBC 13.4 (H) 3.8 - 10.8 Thousand/uL   RBC 5.49 (H) 3.80 - 5.10 Million/uL   Hemoglobin 15.6 (H) 11.7 - 15.5 g/dL   HCT 29.5 (H) 28.4 - 13.2 %   MCV 84.9 80.0 - 100.0 fL   MCH 28.4 27.0 - 33.0 pg   MCHC 33.5 32.0 - 36.0 g/dL   RDW 44.0 (H) 10.2 - 72.5 %   Platelets 171 140 - 400 Thousand/uL   MPV 11.5 7.5 - 12.5 fL   Neutro Abs 11,122 (H) 1,500 - 7,800 cells/uL   Lymphs Abs 1,327 850 - 3,900 cells/uL   Absolute Monocytes 938 200 - 950 cells/uL   Eosinophils Absolute 0 (L) 15 - 500 cells/uL   Basophils Absolute 13 0 - 200 cells/uL   Neutrophils Relative % 83 %   Total Lymphocyte 9.9 %   Monocytes Relative 7.0 %   Eosinophils Relative 0.0 %   Basophils  Relative 0.1 %  COMPLETE METABOLIC PANEL WITH GFR     Status: Abnormal   Collection Time: 08/08/23  8:43 AM  Result Value Ref Range   Glucose, Bld 156 (H) 65 - 99 mg/dL    Comment: .            Fasting reference interval . For someone without known diabetes, a glucose value >125 mg/dL indicates that they may have diabetes and this should be confirmed with a follow-up test. .    BUN 19 7 - 25 mg/dL   Creat 4.74 (H) 2.59 - 1.05 mg/dL   eGFR 43 (L) > OR = 60 mL/min/1.62m2   BUN/Creatinine Ratio 14 6 - 22 (calc)   Sodium 136 135 - 146 mmol/L   Potassium 4.0 3.5 - 5.3 mmol/L   Chloride 100 98 - 110 mmol/L   CO2 22 20 - 32 mmol/L   Calcium 9.1 8.6 - 10.4 mg/dL   Total Protein 7.3 6.1 - 8.1 g/dL   Albumin 3.7 3.6 - 5.1 g/dL    Globulin 3.6 1.9 - 3.7 g/dL (calc)   AG Ratio 1.0 1.0 - 2.5 (calc)   Total Bilirubin 0.8 0.2 - 1.2 mg/dL   Alkaline phosphatase (APISO) 77 37 - 153 U/L   AST 25 10 - 35 U/L   ALT 21 6 - 29 U/L     PHQ2/9:    08/11/2023   12:52 PM 08/08/2023    8:08 AM 06/26/2023    8:00 AM 05/30/2023    8:23 AM 02/20/2023   10:22 AM  Depression screen PHQ 2/9  Decreased Interest 0 0 0 0 0  Down, Depressed, Hopeless 0 0 0 0 0  PHQ - 2 Score 0 0 0 0 0  Altered sleeping 2 2 0 0 0  Tired, decreased energy 3 2 0 0 0  Change in appetite 1 0 0 0 0  Feeling bad or failure about yourself  0 0 0 0 0  Trouble concentrating 0 0 0 0 0  Moving slowly or fidgety/restless 0 0 0 0 0  Suicidal thoughts 0 0 0 0 0  PHQ-9 Score 6 4 0 0 0  Difficult doing work/chores Not difficult at all Not difficult at all       phq 9 is positive  Fall Risk:    08/11/2023   12:51 PM 08/08/2023    8:08 AM 06/26/2023    8:00 AM 05/30/2023    8:23 AM 02/20/2023   10:11 AM  Fall Risk   Falls in the past year? 0 0 0 0 0  Number falls in past yr:   0    Injury with Fall?   0    Risk for fall due to : No Fall Risks No Fall Risks No Fall Risks No Fall Risks No Fall Risks  Follow up Falls prevention discussed Falls prevention discussed Falls prevention discussed Falls prevention discussed Falls prevention discussed     Functional Status Survey: Is the patient deaf or have difficulty hearing?: No Does the patient have difficulty seeing, even when wearing glasses/contacts?: No Does the patient have difficulty concentrating, remembering, or making decisions?: No Does the patient have difficulty walking or climbing stairs?: No Does the patient have difficulty dressing or bathing?: No Does the patient have difficulty doing errands alone such as visiting a doctor's office or shopping?: No   Assessment & Plan  1. Community acquired pneumonia of left lower lobe of lung  Feeling better but not ready  to go back to work yet   2.  Diarrhea, unspecified type  She finished zpack, advised getting probiotics and if symptoms gets worse may need to stop augmentin

## 2023-08-16 NOTE — Progress Notes (Unsigned)
Name: Samantha Copeland   MRN: [id number]    DOB: [birth year]   Date:08/17/2023       Progress Note  Subjective  Chief Complaint  FMLA/ Follow-Up  HPI  CAP: she was seen on 08/27 for acute onset of cough , fever, chills and fatigue. CXR showed CAP , she was treated with Rocephin and Azithromycin. She finished antibiotics and is feeling better, cough has improved, no fever, appetite also good, however she is still feeling very tired. We will keep her out of work until 09/10. 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) 05/30/2015   History of thyroidectomy, total 05/30/2015   Microalbuminuria 05/30/2015   Morbid obesity (HCC) 05/30/2015    Past Surgical History:  Procedure Laterality Date   ABDOMINAL HYSTERECTOMY     AMPUTATION FINGER Right 1963   Index Finger after injury as a infant   BREAST BIOPSY Right 09/02/2021   Affirm bx-calcs"coil" clip-path pending   BREAST EXCISIONAL BIOPSY Right    benign   COLONOSCOPY WITH PROPOFOL N/A 07/11/2022   Procedure: COLONOSCOPY WITH PROPOFOL;  Surgeon: Wyline Mood, MD;  Location: [facility];  Service: Gastroenterology;  Laterality: N/A;   DORSAL COMPARTMENT RELEASE Left 01/18/2017   Procedure: RELEASE DORSAL COMPARTMENT (DEQUERVAIN);  Surgeon: Donato Heinz, MD;  Location: [facility] ORS;  Service: Orthopedics;  Laterality:  Left;   FINE NEEDLE ASPIRATION  10/01/2013   Thyroid-Dr. Tedd Sias   THYROIDECTOMY N/A 12/09/2015   Procedure: THYROIDECTOMY;  Surgeon: Geanie Logan, MD;  Location: [facility] ORS;  Service: ENT;  Laterality: N/A;    Family History  Problem Relation Age of Onset   Diabetes Mother    Diabetes Father    Hypertension Father    Hyperlipidemia Father    Cancer Father    Diabetes Sister    Breast cancer Neg Hx     Social History   Tobacco Use   Smoking status: Never   Smokeless tobacco: Never  Substance Use Topics   Alcohol use: Yes    Alcohol/week: 0.0 standard drinks of alcohol    Comment: occasionally     Current Outpatient Medications:    amoxicillin-clavulanate (AUGMENTIN) 875-125 MG tablet, Take 1 tablet by mouth 2 (two) times daily., Disp: 14 tablet, Rfl: 0   aspirin EC 81 MG tablet, Take 81 mg by mouth every evening. , Disp: , Rfl:    atorvastatin (LIPITOR) 40 MG tablet, Take 1 tablet (40 mg total) by mouth daily., Disp: 90 tablet, Rfl: 1   benzonatate (TESSALON) 100 MG capsule, Take 1 capsule (100 mg total) by mouth in the morning, at noon, in the evening, and at bedtime., Disp: 40 capsule, Rfl: 0   chlorpheniramine-HYDROcodone (TUSSIONEX) 10-8 MG/5ML, Take 5 mLs by mouth every 12 (twelve) hours as needed for cough., Disp: 140 mL, Rfl: 0   Cholecalciferol (D3 5000) 125 MCG (5000 UT) capsule, Take 5,000 Units by mouth  daily., Disp: , Rfl:    Dapagliflozin Pro-metFORMIN ER (XIGDUO XR) 04-999 MG TB24, Take 2 tablets by mouth daily., Disp: 180 tablet, Rfl: 1   levothyroxine (SYNTHROID) 100 MCG tablet, TAKE 1 TABLET BY MOUTH ONCE DAILY EXCEPT  SUNDAYS  -  TAKE  1/2  TAB  ONLY  ON  SUNDAYS, Disp: 94 tablet, Rfl: 0   ondansetron (ZOFRAN) 4 MG tablet, Take 1 tablet (4 mg total) by mouth every 8 (eight) hours as needed for nausea or vomiting., Disp: 20 tablet, Rfl: 0  No Known Allergies  I personally reviewed active problem list, medication list, allergies, family history, social history,  health maintenance with the patient/caregiver today.   ROS  Ten systems reviewed and is negative except as mentioned in HPI    Objective  Vitals:   08/17/23 1125  BP: 122/72  Pulse: 89  Resp: 14  Temp: 98.2 F (36.8 C)  TempSrc: Oral  SpO2: 96%  Weight: 224 lb 11.2 oz (101.9 kg)  Height: 5\' 9"  (1.753 m)    Body mass index is 33.18 kg/m.  Physical Exam  Constitutional: Patient appears well-developed and well-nourished. Obese  No distress.  HEENT: head atraumatic, normocephalic, pupils equal and reactive to light, neck supple Cardiovascular: Normal rate, regular rhythm and normal heart sounds.  No murmur heard. No BLE edema. Pulmonary/Chest: Effort normal and breath sounds normal. No respiratory distress. Abdominal: Soft.  There is no tenderness. Psychiatric: Patient has a normal mood and affect. behavior is normal. Judgment and thought content normal.    PHQ2/9:    08/17/2023   11:26 AM 08/11/2023   12:52 PM 08/08/2023    8:08 AM 06/26/2023    8:00 AM 05/30/2023    8:23 AM  Depression screen PHQ 2/9  Decreased Interest 0 0 0 0 0  Down, Depressed, Hopeless 0 0 0 0 0  PHQ - 2 Score 0 0 0 0 0  Altered sleeping 2 2 2  0 0  Tired, decreased energy 3 3 2  0 0  Change in appetite 1 1 0 0 0  Feeling bad or failure about yourself  0 0 0 0 0  Trouble concentrating 0 0 0 0 0  Moving slowly or fidgety/restless 0 0 0 0 0  Suicidal thoughts 0 0 0 0 0  PHQ-9 Score 6 6 4  0 0  Difficult doing work/chores Somewhat difficult Not difficult at all Not difficult at all      phq 9 is positive   Fall Risk:    08/17/2023   11:26 AM 08/11/2023   12:51 PM 08/08/2023    8:08 AM 06/26/2023    8:00 AM 05/30/2023    8:23 AM  Fall Risk   Falls in the past year? 0 0 0 0 0  Number falls in past yr:    0   Injury with Fall?    0   Risk for fall due to : No Fall Risks No Fall Risks No Fall Risks No Fall Risks No Fall Risks  Follow up  Falls prevention discussed Falls prevention discussed Falls  prevention discussed Falls prevention discussed     Assessment & Plan  1. Community acquired pneumonia of left lower lobe of lung  - CBC with Differential/Platelet - BASIC METABOLIC PANEL WITH GFR

## 2023-08-17 ENCOUNTER — Encounter: Payer: Self-pay | Admitting: Family Medicine

## 2023-08-17 ENCOUNTER — Ambulatory Visit (INDEPENDENT_AMBULATORY_CARE_PROVIDER_SITE_OTHER): Payer: BC Managed Care – PPO | Admitting: Family Medicine

## 2023-08-17 VITALS — BP 122/72 | HR 89 | Temp 98.2°F | Resp 14 | Ht 69.0 in | Wt 224.7 lb

## 2023-08-17 DIAGNOSIS — J189 Pneumonia, unspecified organism: Secondary | ICD-10-CM

## 2023-08-18 LAB — CBC WITH DIFFERENTIAL/PLATELET
Absolute Monocytes: 700 {cells}/uL (ref 200–950)
Basophils Absolute: 40 {cells}/uL (ref 0–200)
Basophils Relative: 0.4 %
Eosinophils Absolute: 100 {cells}/uL (ref 15–500)
Eosinophils Relative: 1 %
HCT: 42.9 % (ref 35.0–45.0)
Hemoglobin: 14 g/dL (ref 11.7–15.5)
Lymphs Abs: 2760 {cells}/uL (ref 850–3900)
MCH: 27.6 pg (ref 27.0–33.0)
MCHC: 32.6 g/dL (ref 32.0–36.0)
MCV: 84.4 fL (ref 80.0–100.0)
MPV: 9.8 fL (ref 7.5–12.5)
Monocytes Relative: 7 %
Neutro Abs: 6400 {cells}/uL (ref 1500–7800)
Neutrophils Relative %: 64 %
Platelets: 375 10*3/uL (ref 140–400)
RBC: 5.08 10*6/uL (ref 3.80–5.10)
RDW: 15.7 % — ABNORMAL HIGH (ref 11.0–15.0)
Total Lymphocyte: 27.6 %
WBC: 10 10*3/uL (ref 3.8–10.8)

## 2023-08-18 LAB — BASIC METABOLIC PANEL WITH GFR
BUN/Creatinine Ratio: 9 (calc) (ref 6–22)
BUN: 10 mg/dL (ref 7–25)
CO2: 25 mmol/L (ref 20–32)
Calcium: 9.4 mg/dL (ref 8.6–10.4)
Chloride: 102 mmol/L (ref 98–110)
Creat: 1.07 mg/dL — ABNORMAL HIGH (ref 0.50–1.05)
Glucose, Bld: 105 mg/dL — ABNORMAL HIGH (ref 65–99)
Potassium: 4.5 mmol/L (ref 3.5–5.3)
Sodium: 139 mmol/L (ref 135–146)
eGFR: 59 mL/min/{1.73_m2} — ABNORMAL LOW (ref >=60)

## 2023-08-24 ENCOUNTER — Ambulatory Visit
Admission: RE | Admit: 2023-08-24 | Discharge: 2023-08-24 | Disposition: A | Discharge status: Home / Self Care | Payer: BC Managed Care – PPO | Source: Ambulatory Visit | Attending: Family Medicine | Admitting: Family Medicine

## 2023-08-24 DIAGNOSIS — Z1231 Encounter for screening mammogram for malignant neoplasm of breast: Secondary | ICD-10-CM | POA: Insufficient documentation

## 2023-08-31 ENCOUNTER — Telehealth: Payer: Self-pay

## 2023-08-31 NOTE — Telephone Encounter (Signed)
Once forms are received we will correct and fax back.

## 2023-08-31 NOTE — Telephone Encounter (Signed)
Pt stated Lo[place]ta [place] needs forms that were sent missing information: "When did the pt receive care for this issue?" According to chart pt has seen 8/27/,8/28,8/30 and 08/17/23. Please notify pt when sent back to [facility].

## 2023-09-03 IMAGING — MG MM DIGITAL DIAGNOSTIC UNILAT*R* W/ TOMO W/ CAD
5 series · 6 of 9 positions shown · non-contrast
Comparison: Previous exam(s).

CLINICAL DATA: Callback for RIGHT breast calcifications.

EXAM:
DIGITAL DIAGNOSTIC UNILATERAL RIGHT MAMMOGRAM WITH TOMOSYNTHESIS AND
CAD
TECHNIQUE: Right digital diagnostic mammography and breast tomosynthesis was
performed. The images were evaluated with computer-aided detection.

[R CC]
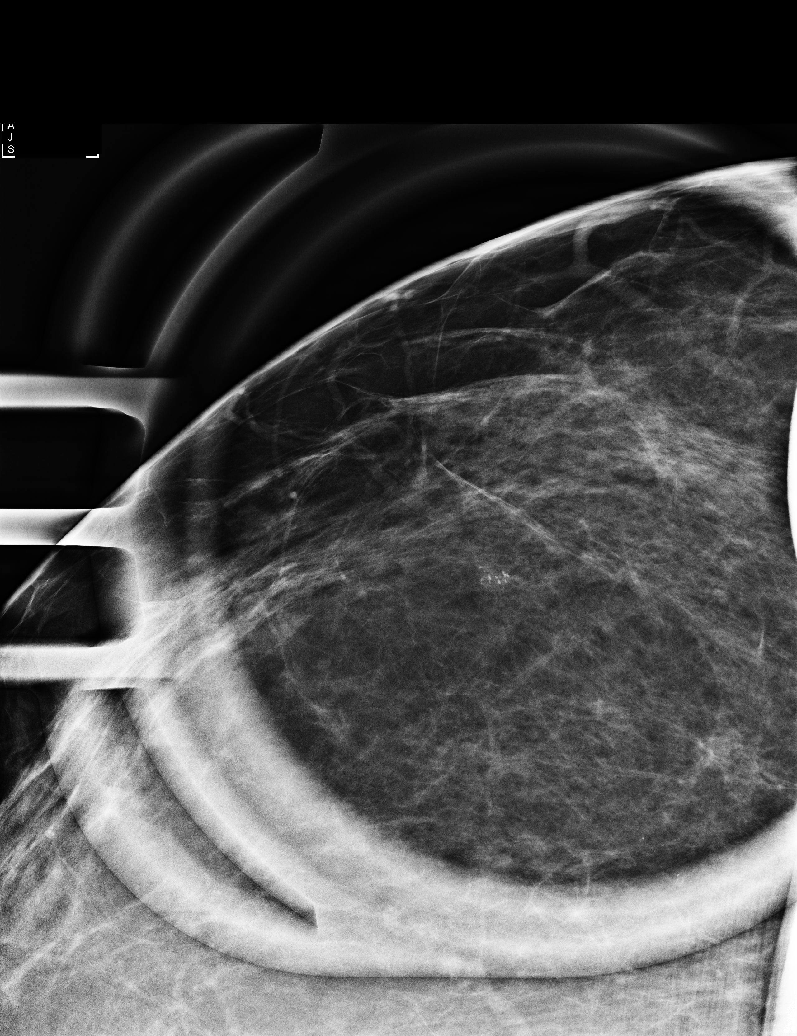

[R ML (1 of 2)]
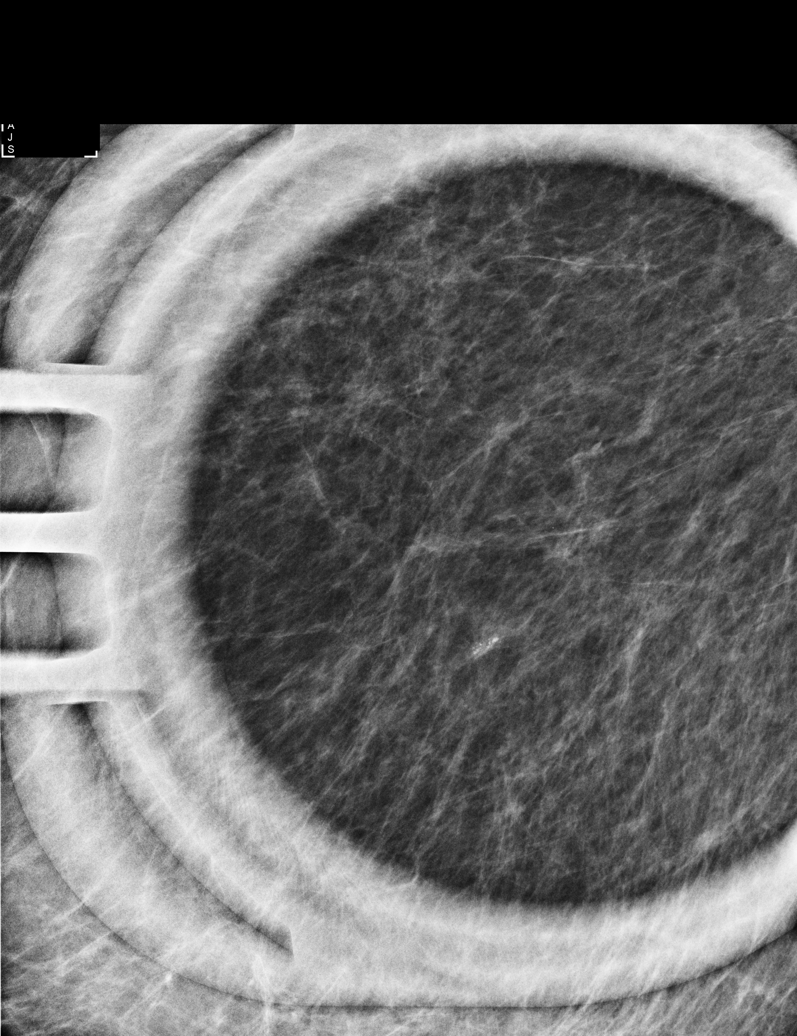

[R ML (2 of 2)]
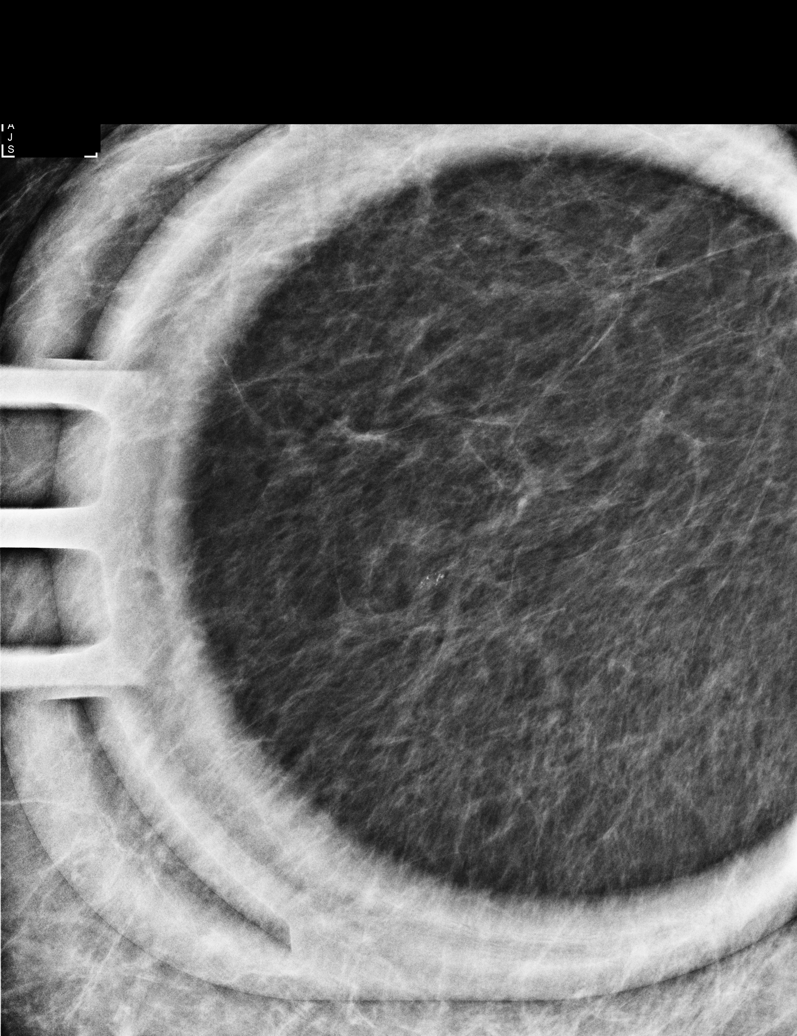

[R ML synth-2D]
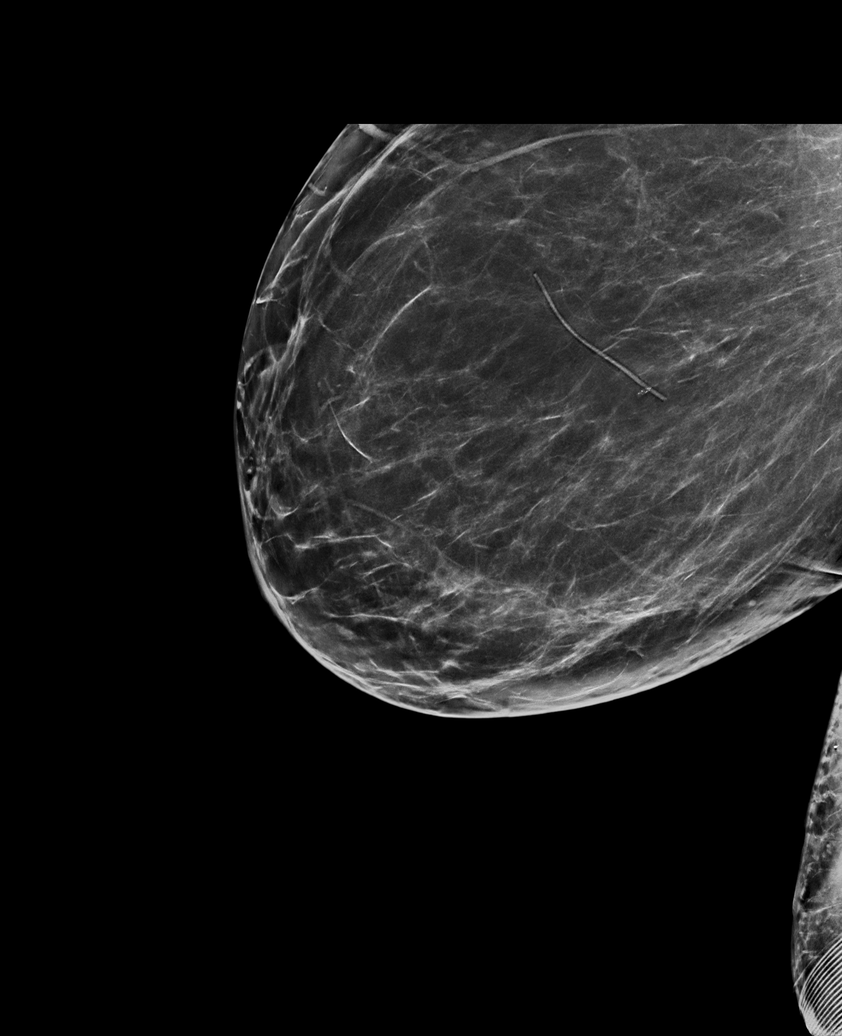

[R ML tomo · 2 of 88 frames shown]
[frame 29/88]
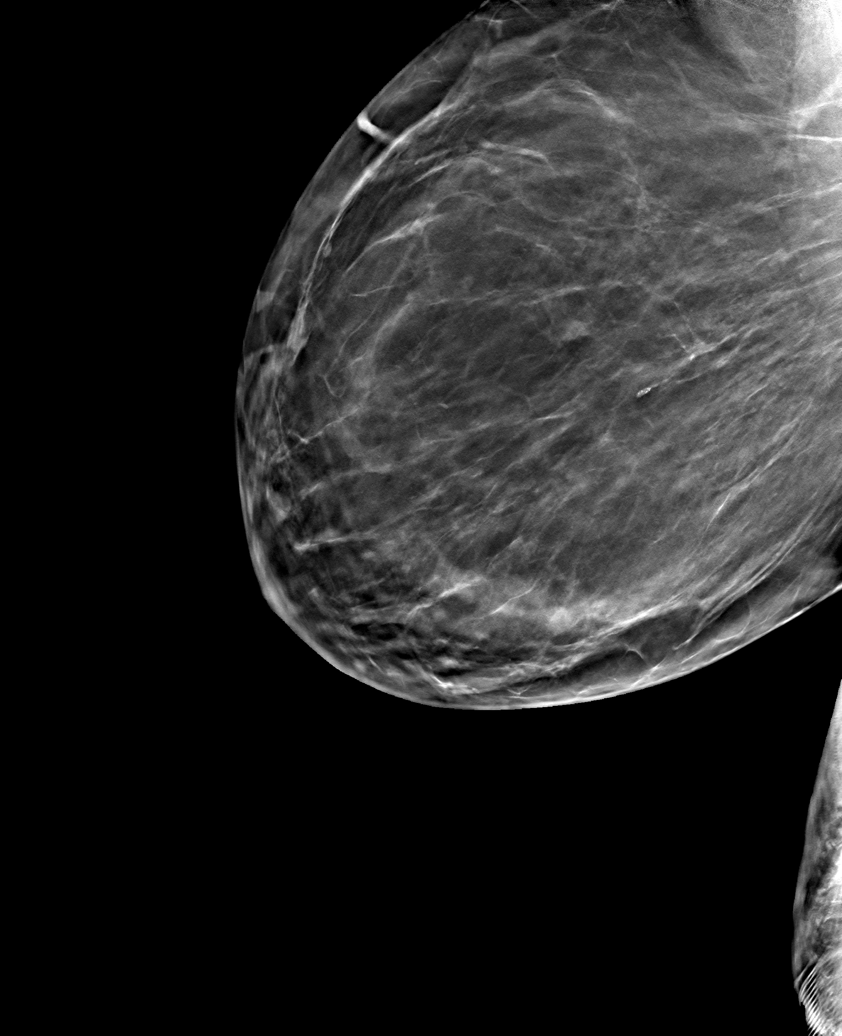
[frame 45/88]
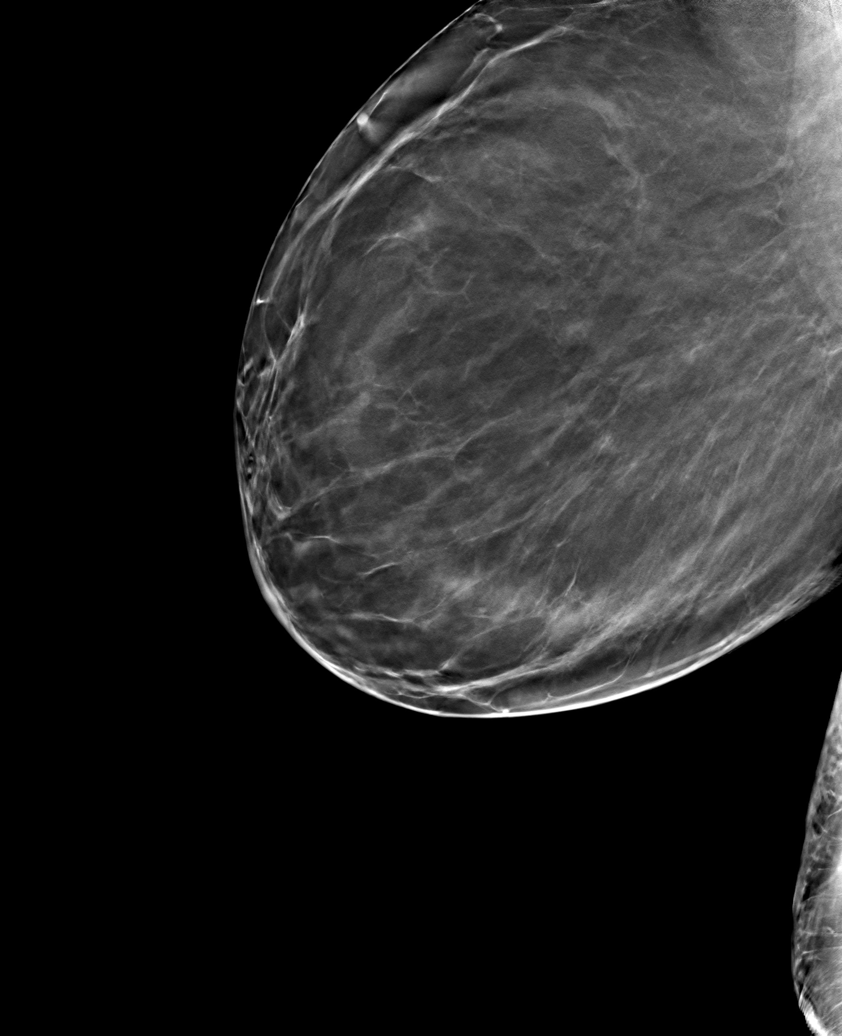

[6 of 9 positions shown; findings below may reference images not displayed]

ACR Breast Density Category b: There are scattered areas of
fibroglandular density.
FINDINGS: Spot magnification views of the RIGHT breast demonstrate a 4 mm
group of coarse heterogeneous calcifications in the outer breast at
middle to posterior depth. These are not definitively stable in
comparison to remote prior mammograms. Postsurgical changes are
noted near these calcifications. No additional suspicious findings
are noted.
IMPRESSION: 1. A 4 mm group of coarse heterogeneous calcifications in the RIGHT
outer breast is indeterminate. Recommend stereotactic guided biopsy
for definitive characterization.

RECOMMENDATION:
RIGHT breast stereotactic guided biopsy x1

I have discussed the findings and recommendations with the patient.
If applicable, a reminder letter will be sent to the patient
regarding the next appointment. Patient will be scheduled for biopsy
at her earliest convenience by the schedulers and ordering provider
will be notified.

BI-RADS CATEGORY  4: Suspicious.

## 2023-09-05 ENCOUNTER — Telehealth: Payer: Self-pay | Admitting: Nurse Practitioner

## 2023-09-05 ENCOUNTER — Ambulatory Visit
Admission: RE | Admit: 2023-09-05 | Discharge: 2023-09-05 | Disposition: A | Discharge status: Home / Self Care | Payer: BC Managed Care – PPO | Attending: Family Medicine | Admitting: Family Medicine

## 2023-09-05 ENCOUNTER — Ambulatory Visit
Admission: RE | Admit: 2023-09-05 | Discharge: 2023-09-05 | Disposition: A | Discharge status: Home / Self Care | Payer: BC Managed Care – PPO | Source: Ambulatory Visit | Attending: Family Medicine | Admitting: Family Medicine

## 2023-09-05 DIAGNOSIS — R918 Other nonspecific abnormal finding of lung field: Secondary | ICD-10-CM | POA: Diagnosis not present

## 2023-09-05 DIAGNOSIS — J189 Pneumonia, unspecified organism: Secondary | ICD-10-CM

## 2023-09-05 NOTE — Telephone Encounter (Signed)
Pt has came in the office again today and asking about if you received the paper work fro [place] that all they needed was for you to put the date of her treatment Pneumonia. Please give the a patient a call at [phone] and advise her for this is needing ASAP.

## 2023-09-05 NOTE — Telephone Encounter (Signed)
Patient said that yo or the dr were to let her know in 3 weeks about her going back to do a lung xray to make sure that the pneumonia is cleared up. Please advise her.

## 2023-09-05 NOTE — Telephone Encounter (Signed)
Amendment to this message. Patient found out order is already there by [facility]. Going over to have done today.

## 2023-09-06 NOTE — Telephone Encounter (Signed)
Called patient and informed her I have completed her forms and faxed then back to [facility].

## 2023-09-11 ENCOUNTER — Telehealth: Payer: Self-pay | Admitting: Family Medicine

## 2023-09-11 NOTE — Telephone Encounter (Signed)
Results have not been read yet.

## 2023-09-11 NOTE — Telephone Encounter (Unsigned)
Copied from CRM [id number]. Topic: General - Other >> Sep 11, 2023  2:38 PM Ja-Kwan M wrote: Reason for CRM: Pt called for x-ray results. Pt requests call back to go over the results. Cb# [phone]

## 2023-09-22 NOTE — Progress Notes (Signed)
Name: Samantha Copeland   MRN: [id number]    DOB: 1961/08/16   Date:09/25/2023       Progress Note  Subjective  Chief Complaint  Follow Up  HPI  DMII with nephropathy  She took Trulicity and metformin for a long time bue  we stopped GLP-1 agonist due to cost  A1C went up to 6.6% , she is currently taking Xigduo 04/999 , she states sometimes her glucose goes down to 97  A1C is at goal at 6.5% Symptoms of carpal tunnel resolved with surgery in 2018, last urine micro normalized ( she is off ARB but still takes SGL-2 agonist ). She denies polyphagia, polydipsia or polyuria   HTN: she was taking  Benicar 20/125 mg and low dose Norvasc since earlier 2022 BP has been towards low end of normal,  we stopped all medications and bp has been  normal however today is slightly elevated - we will recheck before she leaves     Hyperlipidemia: she is on Atorvastatin daily and last LDL at goal at 46 , continue current regiment No side effects   Morbid Obesity:  BMI is a little over  35 with co-morbidities such as DM, HTN and dyslipidemia,  she was on Trulicity, even though still working long hours, weight was stable at 232 lbs. We stopped Trulicity in Feb 2023  due to cost and weight was stable in the 236 lbs range but losing again, eating smaller portions and weight has been stable now in the 220's range - she will continue life style modification    Hypothyroidism: used to see Dr. Willeen Cass had total thyroidectomy in December 2016, doing well, on Synthroid 100 mcg daily and half on Sundays. She  has been released by Dr. Tedd Sias. Taking medication as prescribed and denies change in bowel movements or dry skin.    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 Chronic and stable  History of CAP: doing well now, CXR showed resolution but small atelectasis, advised to take deep breathes hourly. 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 - NEGATIVE   BREAST EXCISIONAL BIOPSY Right    benign   COLONOSCOPY WITH PROPOFOL N/A 07/11/2022   Procedure: COLONOSCOPY WITH PROPOFOL;  Surgeon: Wyline Mood, MD;  Location: [facility] ENDOSCOPY;  Service: Gastroenterology;  Laterality: N/A;   DORSAL COMPARTMENT RELEASE Left 01/18/2017   Procedure: RELEASE DORSAL COMPARTMENT (DEQUERVAIN);  Surgeon: Donato Heinz, MD;  Location: [facility] ORS;  Service: Orthopedics;  Laterality: Left;   FINE NEEDLE ASPIRATION  10/01/2013  Thyroid-Dr. Tedd Sias   THYROIDECTOMY N/A 12/09/2015   Procedure: THYROIDECTOMY;  Surgeon: Geanie Logan, MD;  Location: [facility] ORS;  Service: ENT;  Laterality: N/A;    Family History  Problem Relation Age of Onset   Diabetes Mother    Diabetes Father    Hypertension Father    Hyperlipidemia Father    Cancer Father    Diabetes Sister    Breast cancer Neg Hx     Social History   Tobacco Use   Smoking status: Never   Smokeless tobacco: Never  Substance Use Topics   Alcohol use: Yes     Alcohol/week: 0.0 standard drinks of alcohol    Comment: occasionally     Current Outpatient Medications:    aspirin EC 81 MG tablet, Take 81 mg by mouth every evening. , Disp: , Rfl:    Cholecalciferol (D3 5000) 125 MCG (5000 UT) capsule, Take 5,000 Units by mouth daily., Disp: , Rfl:    atorvastatin (LIPITOR) 40 MG tablet, Take 1 tablet (40 mg total) by mouth daily., Disp: 90 tablet, Rfl: 1   Dapagliflozin Pro-metFORMIN ER (XIGDUO XR) 04-999 MG TB24, Take 2 tablets by mouth daily., Disp: 180 tablet, Rfl: 1   levothyroxine (SYNTHROID) 100 MCG tablet, TAKE 1 TABLET BY MOUTH ONCE DAILY EXCEPT  SUNDAYS  -  TAKE  1/2  TAB  ONLY  ON  SUNDAYS, Disp: 94 tablet, Rfl: 0  No Known Allergies  I personally reviewed active problem list, medication list, allergies, family history, social history, health maintenance with the patient/caregiver today.   ROS  Ten systems reviewed and is negative except as mentioned in HPI    Objective  Vitals:   09/25/23 0906 09/25/23 0928  BP: 138/82 138/80  Pulse: 80   Resp: 14   Temp: 97.8 F (36.6 C)   TempSrc: Oral   SpO2: 98%   Weight: 229 lb 3.2 oz (104 kg)   Height: 5\' 7"  (1.702 m)     Body mass index is 35.9 kg/m.  Physical Exam  Constitutional: Patient appears well-developed and well-nourished. Obese  No distress.  HEENT: head atraumatic, normocephalic, pupils equal and reactive to light, neck supple Cardiovascular: Normal rate, regular rhythm and normal heart sounds.  No murmur heard. No BLE edema. Pulmonary/Chest: Effort normal and breath sounds normal. No respiratory distress. Abdominal: Soft.  There is no tenderness. Psychiatric: Patient has a normal mood and affect. behavior is normal. Judgment and thought content normal.    PHQ2/9:    09/25/2023    9:05 AM 08/17/2023   11:26 AM 08/11/2023   12:52 PM 08/08/2023    8:08 AM 06/26/2023    8:00 AM  Depression screen PHQ 2/9  Decreased Interest 0 0 0 0 0  Down, Depressed, Hopeless 0 0 0  0 0  PHQ - 2 Score 0 0 0 0 0  Altered sleeping 0 2 2 2  0  Tired, decreased energy 0 3 3 2  0  Change in appetite 0 1 1 0 0  Feeling bad or failure about yourself  0 0 0 0 0  Trouble concentrating 0 0 0 0 0  Moving slowly or fidgety/restless 0 0 0 0 0  Suicidal thoughts 0 0 0 0 0  PHQ-9 Score 0 6 6 4  0  Difficult doing work/chores  Somewhat difficult Not difficult at all Not difficult at all     phq 9 is negative   Fall Risk:    09/25/2023    9:05 AM 08/17/2023  11:26 AM 08/11/2023   12:51 PM 08/08/2023    8:08 AM 06/26/2023    8:00 AM  Fall Risk   Falls in the past year? 0 0 0 0 0  Number falls in past yr:     0  Injury with Fall?     0  Risk for fall due to : No Fall Risks No Fall Risks No Fall Risks No Fall Risks No Fall Risks  Follow up Falls prevention discussed  Falls prevention discussed Falls prevention discussed Falls prevention discussed      Functional Status Survey: Is the patient deaf or have difficulty hearing?: No Does the patient have difficulty seeing, even when wearing glasses/contacts?: No Does the patient have difficulty concentrating, remembering, or making decisions?: No Does the patient have difficulty walking or climbing stairs?: No Does the patient have difficulty dressing or bathing?: No Does the patient have difficulty doing errands alone such as visiting a doctor's office or shopping?: No    Assessment & Plan  1. Dyslipidemia associated with type 2 diabetes mellitus (HCC)  - POCT HgB A1C  2. Type 2 diabetes mellitus with microalbuminuria, without long-term current use of insulin (HCC)  - Dapagliflozin Pro-metFORMIN ER (XIGDUO XR) 04-999 MG TB24; Take 2 tablets by mouth daily.  Dispense: 180 tablet; Refill: 1  3. Morbid obesity (HCC)  Discussed with the patient the risk posed by an increased BMI. Discussed importance of portion control, calorie counting and at least 150 minutes of physical activity weekly. Avoid sweet beverages and drink  more water. Eat at least 6 servings of fruit and vegetables daily    4. Need for immunization against influenza  - Flu vaccine trivalent PF, 6mos and older(Flulaval,Afluria,Fluarix,Fluzone)  5. Hypothyroidism, postop  - levothyroxine (SYNTHROID) 100 MCG tablet; TAKE 1 TABLET BY MOUTH ONCE DAILY EXCEPT  SUNDAYS  -  TAKE  1/2  TAB  ONLY  ON  SUNDAYS  Dispense: 94 tablet; Refill: 0  6. Dyslipidemia  - atorvastatin (LIPITOR) 40 MG tablet; Take 1 tablet (40 mg total) by mouth daily.  Dispense: 90 tablet; Refill: 1  7. Amputated finger, sequela (HCC)  Stable  8. History of pneumonia   She is up to date with PCV and flu vaccines, discussed RSV at local pharmacy

## 2023-09-25 ENCOUNTER — Encounter: Payer: Self-pay | Admitting: Family Medicine

## 2023-09-25 ENCOUNTER — Ambulatory Visit: Payer: BC Managed Care – PPO | Admitting: Family Medicine

## 2023-09-25 VITALS — BP 138/80 | HR 80 | Temp 97.8°F | Resp 14 | Ht 67.0 in | Wt 229.2 lb

## 2023-09-25 DIAGNOSIS — E785 Hyperlipidemia, unspecified: Secondary | ICD-10-CM

## 2023-09-25 DIAGNOSIS — S68119S Complete traumatic metacarpophalangeal amputation of unspecified finger, sequela: Secondary | ICD-10-CM

## 2023-09-25 DIAGNOSIS — Z23 Encounter for immunization: Secondary | ICD-10-CM | POA: Diagnosis not present

## 2023-09-25 DIAGNOSIS — E1169 Type 2 diabetes mellitus with other specified complication: Secondary | ICD-10-CM | POA: Diagnosis not present

## 2023-09-25 DIAGNOSIS — E1129 Type 2 diabetes mellitus with other diabetic kidney complication: Secondary | ICD-10-CM

## 2023-09-25 DIAGNOSIS — Z8701 Personal history of pneumonia (recurrent): Secondary | ICD-10-CM

## 2023-09-25 DIAGNOSIS — R809 Proteinuria, unspecified: Secondary | ICD-10-CM

## 2023-09-25 DIAGNOSIS — E890 Postprocedural hypothyroidism: Secondary | ICD-10-CM

## 2023-09-25 LAB — POCT GLYCOSYLATED HEMOGLOBIN (HGB A1C): Hemoglobin A1C: 6.5 % — AB (ref 4.0–5.6)

## 2023-09-25 MED ORDER — DAPAGLIFLOZIN PRO-METFORMIN ER 5-1000 MG PO TB24: 2.0000 | ORAL_TABLET | Freq: Every day | ORAL | 1 refills | Status: DC

## 2023-09-25 MED ORDER — ATORVASTATIN CALCIUM 40 MG PO TABS
40.0000 mg | ORAL_TABLET | Freq: Every day | ORAL | 1 refills | Status: DC
Start: 2023-09-25 — End: 2024-01-29

## 2023-09-25 MED ORDER — LEVOTHYROXINE SODIUM 100 MCG PO TABS
ORAL_TABLET | ORAL | 0 refills | Status: DC
Start: 2023-09-25 — End: 2024-01-29

## 2023-10-04 ENCOUNTER — Other Ambulatory Visit: Payer: Self-pay | Admitting: Family Medicine

## 2023-10-04 ENCOUNTER — Ambulatory Visit: Payer: BC Managed Care – PPO

## 2023-10-04 VITALS — BP 152/96

## 2023-10-04 DIAGNOSIS — I10 Essential (primary) hypertension: Secondary | ICD-10-CM

## 2023-10-04 DIAGNOSIS — R809 Proteinuria, unspecified: Secondary | ICD-10-CM

## 2023-10-04 DIAGNOSIS — Z0130 Encounter for examination of blood pressure without abnormal findings: Secondary | ICD-10-CM

## 2023-10-04 MED ORDER — LOSARTAN POTASSIUM-HCTZ 50-12.5 MG PO TABS
1.0000 | ORAL_TABLET | Freq: Every day | ORAL | 0 refills | Status: DC
Start: 2023-10-04 — End: 2023-11-01

## 2023-10-04 NOTE — Progress Notes (Signed)
Patient came in for Bp check-Provider notified

## 2023-11-01 ENCOUNTER — Other Ambulatory Visit: Payer: Self-pay | Admitting: Family Medicine

## 2023-11-01 DIAGNOSIS — I10 Essential (primary) hypertension: Secondary | ICD-10-CM

## 2023-11-01 DIAGNOSIS — E1129 Type 2 diabetes mellitus with other diabetic kidney complication: Secondary | ICD-10-CM

## 2023-12-02 ENCOUNTER — Other Ambulatory Visit: Payer: Self-pay | Admitting: Family Medicine

## 2023-12-02 DIAGNOSIS — E1129 Type 2 diabetes mellitus with other diabetic kidney complication: Secondary | ICD-10-CM

## 2023-12-02 DIAGNOSIS — I10 Essential (primary) hypertension: Secondary | ICD-10-CM

## 2023-12-04 NOTE — Telephone Encounter (Signed)
Has appt 2/17

## 2024-01-03 ENCOUNTER — Other Ambulatory Visit: Payer: Self-pay | Admitting: Family Medicine

## 2024-01-03 DIAGNOSIS — E1129 Type 2 diabetes mellitus with other diabetic kidney complication: Secondary | ICD-10-CM

## 2024-01-03 DIAGNOSIS — I10 Essential (primary) hypertension: Secondary | ICD-10-CM

## 2024-01-03 NOTE — Telephone Encounter (Signed)
Last appt 09/25/23

## 2024-01-29 ENCOUNTER — Encounter: Payer: Self-pay | Admitting: Family Medicine

## 2024-01-29 ENCOUNTER — Ambulatory Visit: Payer: BC Managed Care – PPO | Admitting: Family Medicine

## 2024-01-29 VITALS — BP 116/74 | HR 72 | Temp 98.1°F | Resp 16 | Ht 67.0 in | Wt 225.1 lb

## 2024-01-29 DIAGNOSIS — Z6835 Body mass index (BMI) 35.0-35.9, adult: Secondary | ICD-10-CM | POA: Diagnosis not present

## 2024-01-29 DIAGNOSIS — I10 Essential (primary) hypertension: Secondary | ICD-10-CM | POA: Diagnosis not present

## 2024-01-29 DIAGNOSIS — E785 Hyperlipidemia, unspecified: Secondary | ICD-10-CM

## 2024-01-29 DIAGNOSIS — E1129 Type 2 diabetes mellitus with other diabetic kidney complication: Secondary | ICD-10-CM | POA: Diagnosis not present

## 2024-01-29 DIAGNOSIS — R809 Proteinuria, unspecified: Secondary | ICD-10-CM

## 2024-01-29 DIAGNOSIS — E890 Postprocedural hypothyroidism: Secondary | ICD-10-CM

## 2024-01-29 DIAGNOSIS — S68119S Complete traumatic metacarpophalangeal amputation of unspecified finger, sequela: Secondary | ICD-10-CM

## 2024-01-29 DIAGNOSIS — Z7984 Long term (current) use of oral hypoglycemic drugs: Secondary | ICD-10-CM

## 2024-01-29 LAB — POCT GLYCOSYLATED HEMOGLOBIN (HGB A1C): Hemoglobin A1C: 6.9 % — AB (ref 4.0–5.6)

## 2024-01-29 MED ORDER — LOSARTAN POTASSIUM-HCTZ 50-12.5 MG PO TABS
1.0000 | ORAL_TABLET | Freq: Every day | ORAL | 0 refills | Status: DC
Start: 2024-01-29 — End: 2024-03-05

## 2024-01-29 MED ORDER — DAPAGLIFLOZIN PRO-METFORMIN ER 5-1000 MG PO TB24: 2.0000 | ORAL_TABLET | Freq: Every day | ORAL | 1 refills | Status: DC

## 2024-01-29 MED ORDER — ATORVASTATIN CALCIUM 40 MG PO TABS
40.0000 mg | ORAL_TABLET | Freq: Every day | ORAL | 1 refills | Status: DC
Start: 2024-01-29 — End: 2024-10-14

## 2024-01-29 MED ORDER — LEVOTHYROXINE SODIUM 100 MCG PO TABS
ORAL_TABLET | ORAL | 1 refills | Status: DC
Start: 2024-01-29 — End: 2024-10-16

## 2024-01-29 NOTE — Progress Notes (Signed)
Name: Samantha Copeland   MRN: [id number]    DOB: [birth year]   Date:01/29/2024       Progress Note  Subjective  Chief Complaint  Chief Complaint  Patient presents with   Medical Management of Chronic Issues   HPI   DMII with nephropathy  She took Trulicity and metformin for a long time bue  we stopped GLP-1 agonist due to cost  A1C went up to 6.6% , she is currently taking Xigduo 04/999 ,A1C has gone up 6.9 %. She has microalbuminuria, dyslipidemia, HTN and obesity. She is taking statin therapy, SGL-2 agonist and ARB.   HTN: she was taking  Benicar 20/125 mg and low dose Norvasc since earlier 2022 BP has been towards low end of normal,  we stopped all medications and bp has been  normal however it spiked in August after CAP and is now back on losartan hydrochlorothiazide 50/12.5 mg, BP today is towards low end of normal, advised to monitor at home, no orthostatic changed     Hyperlipidemia: she is on Atorvastatin daily and last LDL at goal at 46 , continue current regiment No side effects . Recheck levels yearly    Morbid Obesity:  BMI is a little over  35 with co-morbidities such as DM, HTN and dyslipidemia,  she was on Trulicity, even though still working long hours, weight was stable at 232 lbs. We stopped Trulicity in Feb 2023  due to cost and weight was stable in the 236 lbs range but losing again, eating smaller portions and weight has been stable now in the 220's and stable\ - she will continue life style modification    Hypothyroidism: used to see Dr. Willeen Cass had total thyroidectomy in December 2016, doing well, on Synthroid 100 mcg daily and half on Sundays. She  has been released by Dr. Tedd Sias. Taking medication as prescribed and denies change in bowel movements or dry skin.    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 - NEGATIVE   BREAST EXCISIONAL BIOPSY Right    benign   COLONOSCOPY WITH PROPOFOL N/A 07/11/2022   Procedure: COLONOSCOPY WITH PROPOFOL;  Surgeon: Wyline Mood, MD;  Location: [facility] ENDOSCOPY;  Service: Gastroenterology;  Laterality: N/A;   DORSAL COMPARTMENT RELEASE Left 01/18/2017   Procedure: RELEASE DORSAL COMPARTMENT (DEQUERVAIN);  Surgeon: Donato Heinz, MD;  Location: [facility] ORS;  Service: Orthopedics;  Laterality: Left;   FINE NEEDLE ASPIRATION  10/01/2013   Thyroid-Dr. Tedd Sias   THYROIDECTOMY N/A 12/09/2015  Procedure: THYROIDECTOMY;  Surgeon: Geanie Logan, MD;  Location: [facility] ORS;  Service: ENT;  Laterality: N/A;    Family History  Problem Relation Age of Onset   Diabetes Mother    Diabetes Father    Hypertension Father    Hyperlipidemia Father    Cancer Father    Diabetes Sister    Breast cancer Neg Hx     Social History   Tobacco Use   Smoking status: Never   Smokeless tobacco: Never  Substance Use Topics   Alcohol use: Yes    Alcohol/week: 0.0 standard drinks of alcohol    Comment:  occasionally     Current Outpatient Medications:    aspirin EC 81 MG tablet, Take 81 mg by mouth every evening. , Disp: , Rfl:    Cholecalciferol (D3 5000) 125 MCG (5000 UT) capsule, Take 5,000 Units by mouth daily., Disp: , Rfl:    atorvastatin (LIPITOR) 40 MG tablet, Take 1 tablet (40 mg total) by mouth daily., Disp: 90 tablet, Rfl: 1   Dapagliflozin Pro-metFORMIN ER (XIGDUO XR) 04-999 MG TB24, Take 2 tablets by mouth daily., Disp: 180 tablet, Rfl: 1   levothyroxine (SYNTHROID) 100 MCG tablet, TAKE 1 TABLET BY MOUTH ONCE DAILY EXCEPT  SUNDAYS  -  TAKE  1/2  TAB  ONLY  ON  SUNDAYS, Disp: 94 tablet, Rfl: 1   losartan-hydrochlorothiazide (HYZAAR) 50-12.5 MG tablet, Take 1 tablet by mouth daily., Disp: 30 tablet, Rfl: 0  No Known Allergies  I personally reviewed active problem list, medication list, allergies, family history with the patient/caregiver today.   ROS  Ten systems reviewed and is negative except as mentioned in HPI    Objective  Vitals:   01/29/24 0807  BP: 116/74  Pulse: 72  Resp: 16  Temp: 98.1 F (36.7 C)  TempSrc: Oral  Weight: 225 lb 1.6 oz (102.1 kg)  Height: 5\' 7"  (1.702 m)    Body mass index is 35.26 kg/m.  Physical Exam  Constitutional: Patient appears well-developed and well-nourished. Obese  No distress.  HEENT: head atraumatic, normocephalic, pupils equal and reactive to light, neck supple Cardiovascular: Normal rate, regular rhythm and normal heart sounds.  No murmur heard. No BLE edema. Pulmonary/Chest: Effort normal and breath sounds normal. No respiratory distress. Abdominal: Soft.  There is no tenderness. Psychiatric: Patient has a normal mood and affect. behavior is normal. Judgment and thought content normal.   Recent Results (from the past 2160 hours)  POCT glycosylated hemoglobin (Hb A1C)     Status: Abnormal   Collection Time: 01/29/24  8:12 AM  Result Value Ref Range   Hemoglobin A1C 6.9 (A) 4.0 - 5.6 %   HbA1c POC (<> result,  manual entry)     HbA1c, POC (prediabetic range)     HbA1c, POC (controlled diabetic range)      Diabetic Foot Exam:  Diabetic Foot Exam - Simple   Simple Foot Form Diabetic Foot exam was performed with the following findings: Yes 01/29/2024  8:24 AM  Visual Inspection No deformities, no ulcerations, no other skin breakdown bilaterally: Yes Sensation Testing Intact to touch and monofilament testing bilaterally: Yes Pulse Check Posterior Tibialis and Dorsalis pulse intact bilaterally: Yes Comments       PHQ2/9:    01/29/2024    7:56 AM 09/25/2023    9:05 AM 08/17/2023   11:26 AM 08/11/2023   12:52 PM 08/08/2023    8:08 AM  Depression screen PHQ 2/9  Decreased Interest 0 0 0 0  0  Down, Depressed, Hopeless 0 0 0 0 0  PHQ - 2 Score 0 0 0 0 0  Altered sleeping 0 0 2 2 2   Tired, decreased energy 0 0 3 3 2   Change in appetite 0 0 1 1 0  Feeling bad or failure about yourself  0 0 0 0 0  Trouble concentrating 0 0 0 0 0  Moving slowly or fidgety/restless 0 0 0 0 0  Suicidal thoughts 0 0 0 0 0  PHQ-9 Score 0 0 6 6 4   Difficult doing work/chores Not difficult at all  Somewhat difficult Not difficult at all Not difficult at all    phq 9 is negative  Fall Risk:    01/29/2024    7:56 AM 09/25/2023    9:05 AM 08/17/2023   11:26 AM 08/11/2023   12:51 PM 08/08/2023    8:08 AM  Fall Risk   Falls in the past year? 0 0 0 0 0  Number falls in past yr: 0      Injury with Fall? 0      Risk for fall due to : No Fall Risks No Fall Risks No Fall Risks No Fall Risks No Fall Risks  Follow up Falls prevention discussed;Education provided;Falls evaluation completed Falls prevention discussed  Falls prevention discussed Falls prevention discussed     Assessment & Plan   1. Type 2 diabetes mellitus with microalbuminuria, without long-term current use of insulin (HCC) (Primary)  - POCT glycosylated hemoglobin (Hb A1C) - HM Diabetes Foot Exam - Dapagliflozin Pro-metFORMIN ER (XIGDUO XR) 04-999  MG TB24; Take 2 tablets by mouth daily.  Dispense: 180 tablet; Refill: 1 - losartan-hydrochlorothiazide (HYZAAR) 50-12.5 MG tablet; Take 1 tablet by mouth daily.  Dispense: 30 tablet; Refill: 0  2. Benign essential HTN  - losartan-hydrochlorothiazide (HYZAAR) 50-12.5 MG tablet; Take 1 tablet by mouth daily.  Dispense: 30 tablet; Refill: 0 BP is towards low end of normal, but no side effects, if remains low we will stop hydrochlorothiazide since she has DM with  microalbuminuria    3. Morbid obesity (HCC) Discussed with the patient the risk posed by an increased BMI. Discussed importance of portion control, calorie counting and at least 150 minutes of physical activity weekly. Avoid sweet beverages and drink more water. Eat at least 6 servings of fruit and vegetables daily    4. Amputated finger, sequela (HCC)  unchanged  5. Dyslipidemia  - atorvastatin (LIPITOR) 40 MG tablet; Take 1 tablet (40 mg total) by mouth daily.  Dispense: 90 tablet; Refill: 1  6. Hypothyroidism, postop  - levothyroxine (SYNTHROID) 100 MCG tablet; TAKE 1 TABLET BY MOUTH ONCE DAILY EXCEPT  SUNDAYS  -  TAKE  1/2  TAB  ONLY  ON  SUNDAYS  Dispense: 94 tablet; Refill: 1

## 2024-03-05 ENCOUNTER — Other Ambulatory Visit: Payer: Self-pay | Admitting: Family Medicine

## 2024-03-05 DIAGNOSIS — E1129 Type 2 diabetes mellitus with other diabetic kidney complication: Secondary | ICD-10-CM

## 2024-03-05 DIAGNOSIS — I10 Essential (primary) hypertension: Secondary | ICD-10-CM

## 2024-05-01 ENCOUNTER — Other Ambulatory Visit: Payer: Self-pay | Admitting: Family Medicine

## 2024-05-01 DIAGNOSIS — E1129 Type 2 diabetes mellitus with other diabetic kidney complication: Secondary | ICD-10-CM

## 2024-05-01 NOTE — Telephone Encounter (Signed)
 Copied from CRM [id number]. Topic: Clinical - Medication Refill >> May 01, 2024  3:28 PM Samantha Copeland wrote: Medication: Dapagliflozin  Pro-metFORMIN  ER (XIGDUO  XR) 04-999 MG TB24  Has the patient contacted their pharmacy? Yes  (Agent: If no, request that the patient contact the pharmacy for the refill. If patient does not wish to contact the pharmacy document the reason why and proceed with request.) (Agent: If yes, when and what did the pharmacy advise?)  This is the patient's preferred pharmacy:  [facility] [address] ([address]), [place] - [address] Samantha Copeland) [place] [address] Phone: [phone] Fax: [phone]  Is this the correct pharmacy for this prescription? Yes  If no, delete pharmacy and type the correct one.   Has the prescription been filled recently? Yes   Is the patient out of the medication? No   Has the patient been seen for an appointment in the last year OR does the patient have an upcoming appointment? Yes   Can we respond through MyChart? No   Agent: Please be advised that Rx refills may take up to 3 business days. We ask that you follow-up with your pharmacy.

## 2024-05-03 MED ORDER — DAPAGLIFLOZIN PRO-METFORMIN ER 5-1000 MG PO TB24: 2.0000 | ORAL_TABLET | Freq: Every day | ORAL | 0 refills | Status: DC

## 2024-05-03 NOTE — Telephone Encounter (Signed)
 Requested Prescriptions  Pending Prescriptions Disp Refills   Dapagliflozin  Pro-metFORMIN  ER (XIGDUO  XR) 04-999 MG TB24 180 tablet 0    Sig: Take 2 tablets by mouth daily.     Endocrinology:  Diabetes - Biguanide + SGLT2 Inhibitor Combos Failed - 05/03/2024  7:47 AM      Failed - Cr in normal range and within 360 days    Creat  Date Value Ref Range Status  08/17/2023 1.07 (H) 0.50 - 1.05 mg/dL Final   Creatinine, Urine  Date Value Ref Range Status  11/14/2022 51 20 - 275 mg/dL Final         Failed - eGFR in normal range and within 360 days    GFR, Est African American  Date Value Ref Range Status  08/24/2020 69 > OR = 60 mL/min/1.31m2 Final   GFR, Est Non African American  Date Value Ref Range Status  08/24/2020 59 (L) > OR = 60 mL/min/1.51m2 Final   eGFR  Date Value Ref Range Status  08/17/2023 59 (L) > OR = 60 mL/min/1.52m2 Final         Passed - HBA1C is between 0 and 7.9 and within 180 days    Hemoglobin A1C  Date Value Ref Range Status  01/29/2024 6.9 (A) 4.0 - 5.6 % Final   HbA1c, POC (controlled diabetic range)  Date Value Ref Range Status  01/20/2020 6.8 0.0 - 7.0 % Final   Hgb A1c MFr Bld  Date Value Ref Range Status  06/08/2021 6.3 (H) <5.7 % of total Hgb Final    Comment:    For someone without known diabetes, a hemoglobin  A1c value between 5.7% and 6.4% is consistent with prediabetes and should be confirmed with a  follow-up test. . For someone with known diabetes, a value <7% indicates that their diabetes is well controlled. A1c targets should be individualized based on duration of diabetes, age, comorbid conditions, and other considerations. . This assay result is consistent with an increased risk of diabetes. . Currently, no consensus exists regarding use of hemoglobin A1c for diagnosis of diabetes for children. .          Passed - B12 Level in normal range and within 720 days    Vitamin B-12  Date Value Ref Range Status  08/16/2022  986 200 - 1,100 pg/mL Final         Passed - Valid encounter within last 6 months    Recent Outpatient Visits           3 months ago Type 2 diabetes mellitus with microalbuminuria, without long-term current use of insulin  [facility])   [facility] Arleen Lacer, MD       Future Appointments             In 1 month Sowles, Krichna, MD [facility]   In 1 month Sowles, Krichna, MD [facility]            Passed - CBC within normal limits and completed in the last 12 months    WBC  Date Value Ref Range Status  08/17/2023 10.0 3.8 - 10.8 Thousand/uL Final   RBC  Date Value Ref Range Status  08/17/2023 5.08 3.80 - 5.10 Million/uL Final   Hemoglobin  Date Value Ref Range Status  08/17/2023 14.0 11.7 - 15.5 g/dL Final   HCT  Date Value Ref Range Status  08/17/2023 42.9 35.0 - 45.0 % Final  MCHC  Date Value Ref Range Status  08/17/2023 32.6 32.0 - 36.0 g/dL Final   [facility]  Date Value Ref Range Status  08/17/2023 27.6 27.0 - 33.0 pg Final   MCV  Date Value Ref Range Status  08/17/2023 84.4 80.0 - 100.0 fL Final   No results found for: "PLTCOUNTKUC", "LABPLAT", "POCPLA" RDW  Date Value Ref Range Status  08/17/2023 15.7 (H) 11.0 - 15.0 % Final

## 2024-05-27 LAB — HM DIABETES EYE EXAM

## 2024-06-03 ENCOUNTER — Encounter: Payer: Self-pay | Admitting: Family Medicine

## 2024-06-03 ENCOUNTER — Ambulatory Visit: Payer: BC Managed Care – PPO | Admitting: Family Medicine

## 2024-06-03 DIAGNOSIS — E1129 Type 2 diabetes mellitus with other diabetic kidney complication: Secondary | ICD-10-CM | POA: Diagnosis not present

## 2024-06-03 DIAGNOSIS — N1831 Chronic kidney disease, stage 3a: Secondary | ICD-10-CM

## 2024-06-03 DIAGNOSIS — E785 Hyperlipidemia, unspecified: Secondary | ICD-10-CM

## 2024-06-03 DIAGNOSIS — Z1231 Encounter for screening mammogram for malignant neoplasm of breast: Secondary | ICD-10-CM

## 2024-06-03 DIAGNOSIS — Z7984 Long term (current) use of oral hypoglycemic drugs: Secondary | ICD-10-CM

## 2024-06-03 DIAGNOSIS — Z23 Encounter for immunization: Secondary | ICD-10-CM

## 2024-06-03 DIAGNOSIS — E890 Postprocedural hypothyroidism: Secondary | ICD-10-CM | POA: Diagnosis not present

## 2024-06-03 DIAGNOSIS — R809 Proteinuria, unspecified: Secondary | ICD-10-CM

## 2024-06-03 DIAGNOSIS — E1169 Type 2 diabetes mellitus with other specified complication: Secondary | ICD-10-CM | POA: Diagnosis not present

## 2024-06-03 DIAGNOSIS — I152 Hypertension secondary to endocrine disorders: Secondary | ICD-10-CM

## 2024-06-03 DIAGNOSIS — Z13820 Encounter for screening for osteoporosis: Secondary | ICD-10-CM

## 2024-06-03 DIAGNOSIS — E669 Obesity, unspecified: Secondary | ICD-10-CM

## 2024-06-03 DIAGNOSIS — E1159 Type 2 diabetes mellitus with other circulatory complications: Secondary | ICD-10-CM

## 2024-06-03 DIAGNOSIS — S68119S Complete traumatic metacarpophalangeal amputation of unspecified finger, sequela: Secondary | ICD-10-CM

## 2024-06-03 LAB — POCT GLYCOSYLATED HEMOGLOBIN (HGB A1C): Hemoglobin A1C: 6.5 % — AB (ref 4.0–5.6)

## 2024-06-03 MED ORDER — DAPAGLIFLOZIN PRO-METFORMIN ER 5-1000 MG PO TB24: 2.0000 | ORAL_TABLET | Freq: Every day | ORAL | 0 refills | Status: DC

## 2024-06-03 NOTE — Progress Notes (Signed)
 Name: Samantha Copeland   MRN: [id number]    DOB: 02/27/1961   Date:06/03/2024       Progress Note  Subjective  Chief Complaint  Chief Complaint  Patient presents with   Medical Management of Chronic Issues   Discussed the use of AI scribe software for clinical note transcription with the patient, who gave verbal consent to proceed.  History of Present Illness Samantha Copeland is a 63 year old female with diabetes, hypertension, and dyslipidemia who presents for a four-month follow-up.  She experiences significant hot flashes, causing discomfort such as needing to keep her lab coat open. These symptoms have been ongoing and bothersome.  She notes a change in her eating habits over the past few months, feeling full quickly and not wanting to eat more, leading to weight loss noticed by her husband. Despite these changes, her weight has remained stable since February.  Her diabetes management includes a strict diet, which she follows with her husband, who also has diabetes. She has cut out foods like [place] fries and primarily consumes baked foods and chicken salad sandwiches. She is currently taking Xigduo , which includes metformin  and Farxiga, at a dose of 04/999 mg twice daily, with no side effects such as blood infections or yeast infections. She denies polyphagia, polydipsia or polyuria   She has a history of post-surgical hypothyroidism following a thyroidectomy and is taking levothyroxine , 100 micrograms daily, with a half dose on Sundays. No issues with fatigue or hair loss  Her hypertension is managed with losartan  HCTZ 50/12.5 mg, with no dizziness, lightheadedness, chest pain, or palpitations. Blood pressure readings have been stable.  She has a history of chronic kidney disease stage 3 a with a GFR ranging from 43 to 59 over the past year. She takes an SGL-2 agonist and also ARB. She has good urine ouput  She has  dyslipidemia associated with DM and is taking atorvastatin ,  with no reported side effects. Her cholesterol levels have been well-controlled.  Morbid obesity, HTN and DM syndrome, BMI is still above 35, she is eating healthy and is trying to lose weight   She works third shift and has adjusted to the schedule, reporting no need for medication to stay awake and adequate sleep during the day.  She has a history of pneumonia but reports no current respiratory issues and has had previous pneumonia vaccinations.    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 - NEGATIVE   BREAST EXCISIONAL BIOPSY Right    benign   COLONOSCOPY WITH PROPOFOL  N/A 07/11/2022   Procedure: COLONOSCOPY WITH PROPOFOL ;  Surgeon: Therisa Bi, MD;  Location: [facility] ENDOSCOPY;  Service: Gastroenterology;  Laterality: N/A;   DORSAL COMPARTMENT RELEASE Left 01/18/2017   Procedure: RELEASE DORSAL COMPARTMENT (DEQUERVAIN);  Surgeon: Lynwood SHAUNNA Hue, MD;  Location: [facility] ORS;  Service: Orthopedics;  Laterality: Left;   FINE NEEDLE ASPIRATION  10/01/2013    Thyroid-Dr. Damian   THYROIDECTOMY N/A 12/09/2015   Procedure: THYROIDECTOMY;  Surgeon: Deward Dolly, MD;  Location: [facility] ORS;  Service: ENT;  Laterality: N/A;    Family History  Problem Relation Age of Onset   Diabetes Mother    Diabetes Father    Hypertension Father    Hyperlipidemia Father    Cancer Father    Diabetes Sister    Breast cancer Neg Hx     Social History   Tobacco Use   Smoking status: Never   Smokeless tobacco: Never  Substance Use Topics   Alcohol use: Yes    Alcohol/week: 0.0 standard drinks of alcohol    Comment: occasionally     Current Outpatient Medications:    aspirin EC 81 MG tablet, Take 81 mg by mouth every evening. , Disp: , Rfl:    atorvastatin  (LIPITOR) 40 MG tablet, Take 1 tablet (40 mg total) by mouth daily., Disp: 90 tablet, Rfl: 1   Cholecalciferol (D3 5000) 125 MCG (5000 UT) capsule, Take 5,000 Units by mouth daily., Disp: , Rfl:    Dapagliflozin  Pro-metFORMIN  ER (XIGDUO  XR) 04-999 MG TB24, Take 2 tablets by mouth daily., Disp: 180 tablet, Rfl: 0   levothyroxine  (SYNTHROID ) 100 MCG tablet, TAKE 1 TABLET BY MOUTH ONCE DAILY EXCEPT  SUNDAYS  -  TAKE  1/2  TAB  ONLY  ON  SUNDAYS, Disp: 94 tablet, Rfl: 1   losartan -hydrochlorothiazide  (HYZAAR) 50-12.5 MG tablet, Take 1 tablet by mouth once daily, Disp: 90 tablet, Rfl: 0  No Known Allergies  I personally reviewed active problem list, medication list, allergies, family history with the patient/caregiver today.   ROS  Ten systems reviewed and is negative except as mentioned in HPI    Objective Physical Exam VITALS: BP- 114/70 MEASUREMENTS: Weight- 229, BMI- 35.1. CONSTITUTIONAL: Patient appears well-developed and well-nourished.  No distress. HEENT: Head atraumatic, normocephalic, neck supple. CARDIOVASCULAR: Normal rate, regular rhythm and normal heart sounds.  No murmur heard. No BLE edema. PULMONARY: Effort normal and breath sounds normal. No respiratory distress. ABDOMINAL: There is  no tenderness or distention. MUSCULOSKELETAL: Normal gait. Without gross motor or sensory deficit. PSYCHIATRIC: Patient has a normal mood and affect. behavior is normal. Judgment and thought content normal.  Vitals:   06/03/24 0915  BP: 114/70  Pulse: 82  Resp: 16  SpO2: 99%  Weight: 224 lb 1.6 oz (101.7 kg)  Height: 5' 7 (1.702 m)    Body mass index is 35.1 kg/m.  Recent Results (from the past 2160 hours)  HM DIABETES EYE EXAM     Status: None   Collection Time: 05/27/24  9:07 AM  Result Value Ref Range   HM Diabetic Eye Exam No Retinopathy No Retinopathy    Comment: Abstracted by HIM  POCT glycosylated hemoglobin (Hb A1C)     Status: Abnormal   Collection Time: 06/03/24  9:20 AM  Result Value Ref Range   Hemoglobin A1C 6.5 (A) 4.0 - 5.6 %   HbA1c POC (<> result, manual entry)     HbA1c, POC (prediabetic range)     HbA1c, POC (controlled diabetic range)      Diabetic Foot Exam:     PHQ2/9:    06/03/2024  9:08 AM 01/29/2024    7:56 AM 09/25/2023    9:05 AM 08/17/2023   11:26 AM 08/11/2023   12:52 PM  Depression screen PHQ 2/9  Decreased Interest 0 0 0 0 0  Down, Depressed, Hopeless 0 0 0 0 0  PHQ - 2 Score 0 0 0 0 0  Altered sleeping  0 0 2 2  Tired, decreased energy  0 0 3 3  Change in appetite  0 0 1 1  Feeling bad or failure about yourself   0 0 0 0  Trouble concentrating  0 0 0 0  Moving slowly or fidgety/restless  0 0 0 0  Suicidal thoughts  0 0 0 0  PHQ-9 Score  0 0 6 6  Difficult doing work/chores  Not difficult at all  Somewhat difficult Not difficult at all    phq 9 is negative  Fall Risk:    06/03/2024    9:08 AM 01/29/2024    7:56 AM 09/25/2023    9:05 AM 08/17/2023   11:26 AM 08/11/2023   12:51 PM  Fall Risk   Falls in the past year? 0 0 0 0 0  Number falls in past yr: 0 0     Injury with Fall? 0 0     Risk for fall due to : No Fall Risks No Fall Risks No Fall Risks No Fall Risks No Fall Risks  Follow up Falls prevention  discussed;Education provided;Falls evaluation completed Falls prevention discussed;Education provided;Falls evaluation completed Falls prevention discussed  Falls prevention discussed      Assessment & Plan Type 2 diabetes mellitus with CKI stage 3a, dyslipidemia, HTN and obesity A1c improved to 6.5%. Weight at 225 lbs. No side effects from Xigduo . Reduced food intake due to dietary restrictions. - Order comprehensive blood work including lipid panel, sugar, renal function, anemia screening, and thyroid function. - Continue Xigduo  04/999 mg, two tablets daily. - Encourage dietary variety and exploration of diabetic-friendly foods.  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 Blood pressure well-controlled. Advised to avoid NSAIDs. - Continue losartan  HCTZ 50/12.5 mg. - Encourage maintaining blood pressure below 120/80 mmHg. - Avoid NSAIDs to protect kidney function.  Dyslipidemia Well-controlled with atorvastatin . No side effects. - Continue atorvastatin . - Order lipid panel as part of comprehensive blood work.  Obesity, morbid BMI of 35.1. Encouraged weight loss to improve diabetes and hypertension.  Post-surgical hypothyroidism Managed with levothyroxine  100 mcg. No symptoms. - Continue levothyroxine  100 mcg daily. - Order thyroid function test as part of comprehensive blood work.  Pneumonia Due for PCV20 vaccination. - Administer PCV20 vaccination.  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  General Health Maintenance Due for mammogram and bone density test. Discussed insurance coverage for osteoporosis screening. - Schedule mammogram and inquire about insurance coverage for bone density test. - Coordinate mammogram and bone density test appointments.

## 2024-06-04 ENCOUNTER — Ambulatory Visit: Payer: Self-pay | Admitting: Family Medicine

## 2024-06-04 LAB — COMPREHENSIVE METABOLIC PANEL WITH GFR
AG Ratio: 1.7 (calc) (ref 1.0–2.5)
ALT: 14 U/L (ref 6–29)
AST: 15 U/L (ref 10–35)
Albumin: 4.5 g/dL (ref 3.6–5.1)
Alkaline phosphatase (APISO): 74 U/L (ref 37–153)
BUN: 16 mg/dL (ref 7–25)
CO2: 26 mmol/L (ref 20–32)
Calcium: 9.6 mg/dL (ref 8.6–10.4)
Chloride: 103 mmol/L (ref 98–110)
Creat: 0.94 mg/dL (ref 0.50–1.05)
Globulin: 2.7 g/dL (ref 1.9–3.7)
Glucose, Bld: 91 mg/dL (ref 65–99)
Potassium: 4.1 mmol/L (ref 3.5–5.3)
Sodium: 140 mmol/L (ref 135–146)
Total Bilirubin: 0.6 mg/dL (ref 0.2–1.2)
Total Protein: 7.2 g/dL (ref 6.1–8.1)
eGFR: 69 mL/min/{1.73_m2} (ref >=60)

## 2024-06-04 LAB — CBC WITH DIFFERENTIAL/PLATELET
Absolute Lymphocytes: 2403 {cells}/uL (ref 850–3900)
Absolute Monocytes: 494 {cells}/uL (ref 200–950)
Basophils Absolute: 31 {cells}/uL (ref 0–200)
Basophils Relative: 0.5 %
Eosinophils Absolute: 98 {cells}/uL (ref 15–500)
Eosinophils Relative: 1.6 %
HCT: 45.8 % — ABNORMAL HIGH (ref 35.0–45.0)
Hemoglobin: 14.5 g/dL (ref 11.7–15.5)
MCH: 28.1 pg (ref 27.0–33.0)
MCHC: 31.7 g/dL — ABNORMAL LOW (ref 32.0–36.0)
MCV: 88.8 fL (ref 80.0–100.0)
MPV: 11.1 fL (ref 7.5–12.5)
Monocytes Relative: 8.1 %
Neutro Abs: 3074 {cells}/uL (ref 1500–7800)
Neutrophils Relative %: 50.4 %
Platelets: 226 10*3/uL (ref 140–400)
RBC: 5.16 10*6/uL — ABNORMAL HIGH (ref 3.80–5.10)
RDW: 14.5 % (ref 11.0–15.0)
Total Lymphocyte: 39.4 %
WBC: 6.1 10*3/uL (ref 3.8–10.8)

## 2024-06-04 LAB — TSH: TSH: 0.84 m[IU]/L (ref 0.40–4.50)

## 2024-06-04 LAB — MICROALBUMIN / CREATININE URINE RATIO
Creatinine, Urine: 75 mg/dL (ref 20–275)
Microalb Creat Ratio: 8 mg/g{creat} (ref <30)
Microalb, Ur: 0.6 mg/dL

## 2024-06-04 LAB — LIPID PANEL
Cholesterol: 168 mg/dL (ref <200)
HDL: 107 mg/dL (ref >=50)
LDL Cholesterol (Calc): 47 mg/dL
Non-HDL Cholesterol (Calc): 61 mg/dL (ref <130)
Total CHOL/HDL Ratio: 1.6 (calc) (ref <5.0)
Triglycerides: 67 mg/dL (ref <150)

## 2024-06-04 LAB — VITAMIN D 25 HYDROXY (VIT D DEFICIENCY, FRACTURES): Vit D, 25-Hydroxy: 76 ng/mL (ref 30–100)

## 2024-06-04 LAB — PTH, INTACT AND CALCIUM
Calcium: 9.6 mg/dL (ref 8.6–10.4)
PTH: 26 pg/mL (ref 16–77)

## 2024-06-07 ENCOUNTER — Other Ambulatory Visit: Payer: Self-pay | Admitting: Family Medicine

## 2024-06-07 DIAGNOSIS — R809 Proteinuria, unspecified: Secondary | ICD-10-CM

## 2024-06-07 DIAGNOSIS — I10 Essential (primary) hypertension: Secondary | ICD-10-CM

## 2024-06-27 ENCOUNTER — Ambulatory Visit (INDEPENDENT_AMBULATORY_CARE_PROVIDER_SITE_OTHER): Payer: Self-pay | Admitting: Family Medicine

## 2024-06-27 ENCOUNTER — Encounter: Payer: Self-pay | Admitting: Family Medicine

## 2024-06-27 VITALS — BP 128/74 | HR 69 | Resp 16 | Ht 67.0 in | Wt 224.2 lb

## 2024-06-27 DIAGNOSIS — Z1231 Encounter for screening mammogram for malignant neoplasm of breast: Secondary | ICD-10-CM | POA: Diagnosis not present

## 2024-06-27 DIAGNOSIS — E2839 Other primary ovarian failure: Secondary | ICD-10-CM

## 2024-06-27 DIAGNOSIS — R21 Rash and other nonspecific skin eruption: Secondary | ICD-10-CM

## 2024-06-27 DIAGNOSIS — M65312 Trigger thumb, left thumb: Secondary | ICD-10-CM

## 2024-06-27 DIAGNOSIS — Z0000 Encounter for general adult medical examination without abnormal findings: Secondary | ICD-10-CM | POA: Diagnosis not present

## 2024-06-27 DIAGNOSIS — R0683 Snoring: Secondary | ICD-10-CM

## 2024-06-27 DIAGNOSIS — Z13820 Encounter for screening for osteoporosis: Secondary | ICD-10-CM

## 2024-06-27 NOTE — Progress Notes (Signed)
 Name: Samantha Copeland   MRN: [id number]    DOB: 1961-04-27   Date:06/27/2024       Progress Note  Subjective  Chief Complaint  Chief Complaint  Patient presents with   Annual Exam    HPI  Patient presents for annual CPE.  Diet: she has been eating out more often lately but is choosing healthier meals like vegetables and fish  Exercise: not walking as often, working long hours - she has a physical job, Nature conservation officer at [employer] Exam: completed Last Dental Exam: completed  Flowsheet Row Office Visit from 06/27/2024 in [facility]  AUDIT-C Score 1   Depression: Phq 9 is  negative    06/27/2024    7:58 AM 06/03/2024    9:08 AM 01/29/2024    7:56 AM 09/25/2023    9:05 AM 08/17/2023   11:26 AM  Depression screen PHQ 2/9  Decreased Interest 0 0 0 0 0  Down, Depressed, Hopeless 0 0 0 0 0  PHQ - 2 Score 0 0 0 0 0  Altered sleeping   0 0 2  Tired, decreased energy   0 0 3  Change in appetite   0 0 1  Feeling bad or failure about yourself    0 0 0  Trouble concentrating   0 0 0  Moving slowly or fidgety/restless   0 0 0  Suicidal thoughts   0 0 0  PHQ-9 Score   0 0 6  Difficult doing work/chores   Not difficult at all  Somewhat difficult   Hypertension: BP Readings from Last 3 Encounters:  06/27/24 128/74  06/03/24 114/70  01/29/24 116/74   Obesity: Wt Readings from Last 3 Encounters:  06/27/24 224 lb 3.2 oz (101.7 kg)  06/03/24 224 lb 1.6 oz (101.7 kg)  01/29/24 225 lb 1.6 oz (102.1 kg)   BMI Readings from Last 3 Encounters:  06/27/24 35.11 kg/m  06/03/24 35.10 kg/m  01/29/24 35.26 kg/m     Vaccines: reviewed with the patient.   Hep C Screening: completed STD testing and prevention (HIV/chl/gon/syphilis): not interested  Intimate partner violence: negative screen  Sexual History : libido is okay, no pain during intercourse  Menstrual History/LMP/Abnormal Bleeding: hysterectomy  Discussed importance of follow up if any  post-menopausal bleeding: not applicable  Incontinence Symptoms: negative for symptoms   Breast cancer:  - Last Mammogram: due is Sep - BRCA gene screening: N/A  Osteoporosis Prevention : Discussed high calcium  and vitamin D  supplementation, weight bearing exercises Bone density :not applicable   Cervical cancer screening: not applicable due to hysterectomy  Skin cancer: Discussed monitoring for atypical lesions  Colorectal cancer: up to date repeat 2030   Lung cancer:  Low Dose CT Chest recommended if Age 13-80 years, 20 pack-year currently smoking OR have quit w/in 15years. Patient does not qualify for screen   ECG: due for repeat   Advanced Care Planning: A voluntary discussion about advance care planning including the explanation and discussion of advance directives.  Discussed health care proxy and Living will, and the patient was able to identify a health care proxy as married.  Patient does not have a living will and power of attorney of health care   Patient Active Problem List   Diagnosis Date Noted   Chronic kidney disease, stage 3a (HCC) 06/03/2024   Dyslipidemia associated with type 2 diabetes mellitus (HCC) 06/03/2024   Obesity, diabetes, and hypertension syndrome (HCC) 06/03/2024   Shift work  sleep disorder 02/20/2023   Amputated finger, sequela (HCC) 06/12/2019   Osteoarthritis, knee 04/06/2018   Diabetic nephropathy associated with type 2 diabetes mellitus (HCC) 03/01/2018   Status post de Quervain's release surgery 03/05/2017   Hypothyroidism, postop 03/14/2016   Benign essential HTN 05/30/2015   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 - NEGATIVE   BREAST EXCISIONAL BIOPSY Right    benign   COLONOSCOPY WITH PROPOFOL  N/A 07/11/2022   Procedure: COLONOSCOPY WITH PROPOFOL ;  Surgeon: Therisa Bi, MD;  Location: [facility] ENDOSCOPY;  Service: Gastroenterology;  Laterality: N/A;   DORSAL COMPARTMENT RELEASE Left 01/18/2017   Procedure: RELEASE DORSAL COMPARTMENT (DEQUERVAIN);  Surgeon: Lynwood SHAUNNA Hue, MD;  Location: [facility] ORS;  Service: Orthopedics;  Laterality: Left;   FINE NEEDLE ASPIRATION  10/01/2013   Thyroid-Dr. Damian   THYROIDECTOMY N/A 12/09/2015   Procedure: THYROIDECTOMY;  Surgeon: Deward Dolly, MD;  Location: [facility] ORS;  Service: ENT;  Laterality: N/A;    Family History  Problem Relation Age of Onset   Diabetes Mother    Diabetes Father    Hypertension Father    Hyperlipidemia Father    Cancer Father    Diabetes Sister    Breast cancer Neg Hx     Social History   Socioeconomic History   Marital status: Married    Spouse name: Vaughan   Number of children: 3   Years of education: Not on file   Highest education level: 12th grade  Occupational History   Occupation: Holiday representative: [employer]    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  Tobacco Use   Smoking status: Never   Smokeless tobacco: Never  Vaping Use   Vaping status: Never Used  Substance and Sexual Activity   Alcohol use: Yes    Comment: occasionally   Drug use: No   Sexual activity: Yes    Partners: Male    Birth control/protection: Surgical  Other Topics Concern   Not on file  Social History Narrative   Lives with husband   Three grown children    One grandson    Social Drivers of Corporate investment banker Strain: Low Risk  (06/27/2024)   Overall Financial Resource Strain (CARDIA)    Difficulty of Paying Living Expenses: Not hard at all  Food Insecurity: No Food Insecurity (06/27/2024)   Hunger Vital Sign    Worried About Running Out of Food in the Last Year: Never true    Ran  Out of Food in the Last Year: Never true  Transportation Needs: No Transportation Needs (06/27/2024)   PRAPARE - Administrator, Civil Service (Medical): No    Lack of Transportation (Non-Medical): No  Physical Activity: Inactive (06/27/2024)   Exercise Vital Sign    Days of Exercise per Week: 0 days    Minutes of Exercise per Session: 0 min  Stress: No Stress Concern Present (06/27/2024)   [employer] of Occupational Health - Occupational Stress Questionnaire    Feeling of Stress: Not at all  Social Connections: Moderately Isolated (06/27/2024)   Social Connection and Isolation Panel    Frequency of Communication  with Friends and Family: More than three times a week    Frequency of Social Gatherings with Friends and Family: Three times a week    Attends Religious Services: Never    Active Member of Clubs or Organizations: No    Attends Banker Meetings: Never    Marital Status: Married  Catering manager Violence: Not At Risk (06/27/2024)   Humiliation, Afraid, Rape, and Kick questionnaire    Fear of Current or Ex-Partner: No    Emotionally Abused: No    Physically Abused: No    Sexually Abused: No     Current Outpatient Medications:    aspirin EC 81 MG tablet, Take 81 mg by mouth every evening. , Disp: , Rfl:    atorvastatin  (LIPITOR) 40 MG tablet, Take 1 tablet (40 mg total) by mouth daily., Disp: 90 tablet, Rfl: 1   Cholecalciferol (D3 5000) 125 MCG (5000 UT) capsule, Take 5,000 Units by mouth daily., Disp: , Rfl:    Dapagliflozin  Pro-metFORMIN  ER (XIGDUO  XR) 04-999 MG TB24, Take 2 tablets by mouth daily., Disp: 180 tablet, Rfl: 0   levothyroxine  (SYNTHROID ) 100 MCG tablet, TAKE 1 TABLET BY MOUTH ONCE DAILY EXCEPT  SUNDAYS  -  TAKE  1/2  TAB  ONLY  ON  SUNDAYS, Disp: 94 tablet, Rfl: 1   losartan -hydrochlorothiazide  (HYZAAR) 50-12.5 MG tablet, Take 1 tablet by mouth once daily, Disp: 30 tablet, Rfl: 0  No Known Allergies   ROS  Constitutional:  Negative for fever or weight change.  Respiratory: Negative for cough and shortness of breath.   Cardiovascular: Negative for chest pain or palpitations.  Gastrointestinal: Negative for abdominal pain, no bowel changes.  Musculoskeletal: Negative for gait problem or joint swelling. Trigger thumb Skin: dry patch on right flank area, non tender Neurological: Negative for dizziness or headache.  No other specific complaints in a complete review of systems (except as listed in HPI above).   Objective  Vitals:   06/27/24 0804  BP: 128/74  Pulse: 69  Resp: 16  SpO2: 97%  Weight: 224 lb 3.2 oz (101.7 kg)  Height: 5' 7 (1.702 m)    Body mass index is 35.11 kg/m.  Physical Exam  Constitutional: Patient appears well-developed and well-nourished. No distress.  HENT: Head: Normocephalic and atraumatic. Ears: B TMs ok, no erythema or effusion; Nose: Nose normal. Mouth/Throat: Oropharynx is clear and moist. No oropharyngeal exudate.  Eyes: Conjunctivae and EOM are normal. Pupils are equal, round, and reactive to light. No scleral icterus.  Neck: Normal range of motion. Neck supple. No JVD present. No thyromegaly present.  Cardiovascular: Normal rate, regular rhythm and normal heart sounds.  No murmur heard. No BLE edema. Pulmonary/Chest: Effort normal and breath sounds normal. No respiratory distress. Abdominal: Soft. Bowel sounds are normal, no distension. There is no tenderness. no masses Breast: no lumps or masses, no nipple discharge or rashes FEMALE GENITALIA:  Not done  RECTAL: not done  Musculoskeletal: Normal range of motion, no joint effusions. No gross deformities Neurological: he is alert and oriented to person, place, and time. No cranial nerve deficit. Coordination, balance, strength, speech and gait are normal.  Skin: Skin is warm and dry. No rash noted. No erythema.  Psychiatric: Patient has a normal mood and affect. behavior is normal. Judgment and thought content normal.      Assessment & Plan  1. Well adult exam (Primary)   2. Encounter for screening mammogram for malignant neoplasm of breast  - MM 3D SCREENING MAMMOGRAM  BILATERAL BREAST; Future  3. Osteoporosis screening  - DG Bone Density; Future  4. Ovarian failure  - DG Bone Density; Future   5. Trigger thumb, left thumb  Use a brace for now and discuss further on follow up  6. Rash  Try hydrocortisone 10 and discuss further on follow up  7. Snoring  ESS was 5, HCT elevated, discussed sleep apnea but does not qualify for sleep study at this time     -USPSTF grade A and B recommendations reviewed with patient; age-appropriate recommendations, preventive care, screening tests, etc discussed and encouraged; healthy living encouraged; see AVS for patient education given to patient -Discussed importance of 150 minutes of physical activity weekly, eat two servings of fish weekly, eat one serving of tree nuts ( cashews, pistachios, pecans, almonds.SABRA) every other day, eat 6 servings of fruit/vegetables daily and drink plenty of water and avoid sweet beverages.   -Reviewed Health Maintenance: Yes.

## 2024-06-27 NOTE — Patient Instructions (Signed)
 Preventive Care 16-63 Years Old, Female  Preventive care refers to lifestyle choices and visits with your health care provider that can promote health and wellness. Preventive care visits are also called wellness exams.  What can I expect for my preventive care visit?  Counseling  Your health care provider may ask you questions about your:  Medical history, including:  Past medical problems.  Family medical history.  Pregnancy history.  Current health, including:  Menstrual cycle.  Method of birth control.  Emotional well-being.  Home life and relationship well-being.  Sexual activity and sexual health.  Lifestyle, including:  Alcohol, nicotine or tobacco, and drug use.  Access to firearms.  Diet, exercise, and sleep habits.  Work and work Astronomer.  Sunscreen use.  Safety issues such as seatbelt and bike helmet use.  Physical exam  Your health care provider will check your:  Height and weight. These may be used to calculate your BMI (body mass index). BMI is a measurement that tells if you are at a healthy weight.  Waist circumference. This measures the distance around your waistline. This measurement also tells if you are at a healthy weight and may help predict your risk of certain diseases, such as type 2 diabetes and high blood pressure.  Heart rate and blood pressure.  Body temperature.  Skin for abnormal spots.  What immunizations do I need?    Vaccines are usually given at various ages, according to a schedule. Your health care provider will recommend vaccines for you based on your age, medical history, and lifestyle or other factors, such as travel or where you work.  What tests do I need?  Screening  Your health care provider may recommend screening tests for certain conditions. This may include:  Lipid and cholesterol levels.  Diabetes screening. This is done by checking your blood sugar (glucose) after you have not eaten for a while (fasting).  Pelvic exam and Pap test.  Hepatitis B test.  Hepatitis C  test.  HIV (human immunodeficiency virus) test.  STI (sexually transmitted infection) testing, if you are at risk.  Lung cancer screening.  Colorectal cancer screening.  Mammogram. Talk with your health care provider about when you should start having regular mammograms. This may depend on whether you have a family history of breast cancer.  BRCA-related cancer screening. This may be done if you have a family history of breast, ovarian, tubal, or peritoneal cancers.  Bone density scan. This is done to screen for osteoporosis.  Talk with your health care provider about your test results, treatment options, and if necessary, the need for more tests.  Follow these instructions at home:  Eating and drinking    Eat a diet that includes fresh fruits and vegetables, whole grains, lean protein, and low-fat dairy products.  Take vitamin and mineral supplements as recommended by your health care provider.  Do not drink alcohol if:  Your health care provider tells you not to drink.  You are pregnant, may be pregnant, or are planning to become pregnant.  If you drink alcohol:  Limit how much you have to 0-1 drink a day.  Know how much alcohol is in your drink. In the [place] one drink equals one 12 oz bottle of beer (355 mL), one 5 oz glass of wine (148 mL), or one 1 oz glass of hard liquor (44 mL).  Lifestyle  Brush your teeth every morning and night with fluoride toothpaste. Floss one time each day.  Exercise for at least  30 minutes 5 or more days each week.  Do not use any products that contain nicotine or tobacco. These products include cigarettes, chewing tobacco, and vaping devices, such as e-cigarettes. If you need help quitting, ask your health care provider.  Do not use drugs.  If you are sexually active, practice safe sex. Use a condom or other form of protection to prevent STIs.  If you do not wish to become pregnant, use a form of birth control. If you plan to become pregnant, see your health care provider for a  prepregnancy visit.  Take aspirin only as told by your health care provider. Make sure that you understand how much to take and what form to take. Work with your health care provider to find out whether it is safe and beneficial for you to take aspirin daily.  Find healthy ways to manage stress, such as:  Meditation, yoga, or listening to music.  Journaling.  Talking to a trusted person.  Spending time with friends and family.  Minimize exposure to UV radiation to reduce your risk of skin cancer.  Safety  Always wear your seat belt while driving or riding in a vehicle.  Do not drive:  If you have been drinking alcohol. Do not ride with someone who has been drinking.  When you are tired or distracted.  While texting.  If you have been using any mind-altering substances or drugs.  Wear a helmet and other protective equipment during sports activities.  If you have firearms in your house, make sure you follow all gun safety procedures.  Seek help if you have been physically or sexually abused.  What's next?  Visit your health care provider once a year for an annual wellness visit.  Ask your health care provider how often you should have your eyes and teeth checked.  Stay up to date on all vaccines.  This information is not intended to replace advice given to you by your health care provider. Make sure you discuss any questions you have with your health care provider.  Document Revised: 05/26/2021 Document Reviewed: 05/26/2021  Elsevier Patient Education  2024 [employer].

## 2024-07-09 ENCOUNTER — Other Ambulatory Visit: Payer: Self-pay | Admitting: Family Medicine

## 2024-07-09 DIAGNOSIS — E1129 Type 2 diabetes mellitus with other diabetic kidney complication: Secondary | ICD-10-CM

## 2024-07-09 DIAGNOSIS — I10 Essential (primary) hypertension: Secondary | ICD-10-CM

## 2024-07-09 MED ORDER — LOSARTAN POTASSIUM-HCTZ 50-12.5 MG PO TABS
1.0000 | ORAL_TABLET | Freq: Every day | ORAL | 0 refills | Status: DC
Start: 2024-07-09 — End: 2024-10-14

## 2024-08-20 ENCOUNTER — Encounter: Payer: Self-pay | Admitting: Family Medicine

## 2024-08-20 ENCOUNTER — Ambulatory Visit (INDEPENDENT_AMBULATORY_CARE_PROVIDER_SITE_OTHER): Admitting: Family Medicine

## 2024-08-20 VITALS — BP 116/74 | HR 96 | Resp 16 | Ht 67.0 in | Wt 216.4 lb

## 2024-08-20 DIAGNOSIS — M65312 Trigger thumb, left thumb: Secondary | ICD-10-CM | POA: Diagnosis not present

## 2024-08-20 DIAGNOSIS — E1129 Type 2 diabetes mellitus with other diabetic kidney complication: Secondary | ICD-10-CM | POA: Diagnosis not present

## 2024-08-20 DIAGNOSIS — R809 Proteinuria, unspecified: Secondary | ICD-10-CM

## 2024-08-20 MED ORDER — METHYLPREDNISOLONE ACETATE 40 MG/ML IJ SUSP
40.0000 mg | Freq: Once | INTRAMUSCULAR | Status: AC
Start: 2024-08-20 — End: 2024-08-20
  Administered 2024-08-20: 40 mg via INTRAMUSCULAR

## 2024-08-20 MED ORDER — LIDOCAINE HCL (PF) 1 % IJ SOLN
2.0000 mL | Freq: Once | INTRAMUSCULAR | Status: AC
Start: 2024-08-20 — End: 2024-08-20
  Administered 2024-08-20: 2 mL

## 2024-08-20 NOTE — Progress Notes (Signed)
 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    Discussed the use of AI scribe software for clinical note transcription with the patient, who gave verbal consent to proceed.  History of Present Illness Samantha Copeland is a 63 year old female who presents with a trigger thumb in her left hand.  She experiences her left thumb getting stuck in a flexed position, causing discomfort and pain. The pain is localized to the mid part of the thumb, with no pain on the opposite side or the top of the thumb.  The issue persists despite using a brace, which has not provided relief. Discomfort is present even when using a spiker brace.  No pain on the top of the thumb, and the pain is more on the mid part of the thumb.    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 , Disp: , Rfl:    atorvastatin  (LIPITOR) 40 MG tablet, Take 1 tablet (40 mg total) by mouth daily., Disp: 90 tablet, Rfl: 1   Cholecalciferol (D3 5000) 125 MCG (5000 UT) capsule, Take 5,000 Units by mouth daily., Disp: , Rfl:    Dapagliflozin  Pro-metFORMIN  ER (XIGDUO  XR) 04-999 MG TB24, Take 2 tablets by mouth daily., Disp: 180 tablet, Rfl: 0   levothyroxine  (SYNTHROID ) 100 MCG tablet, TAKE 1 TABLET BY MOUTH ONCE DAILY EXCEPT  SUNDAYS  -  TAKE  1/2  TAB  ONLY  ON  SUNDAYS, Disp: 94 tablet, Rfl: 1   losartan -hydrochlorothiazide  (HYZAAR) 50-12.5 MG tablet, Take 1 tablet by mouth daily., Disp: 90 tablet, Rfl: 0  No Known Allergies  ROS  Ten systems reviewed and is negative except as mentioned in HPI    Objective  Vitals:   08/20/24 1058  BP: 116/74  Pulse: 96  Resp: 16  SpO2: 100%  Weight: 216 lb 6.4 oz (98.2 kg)  Height: 5' 7 (1.702 m)    Body mass index is 33.89 kg/m.    Physical Exam CONSTITUTIONAL: Patient appears well-developed and well-nourished. No distress. HEENT: Head atraumatic, normocephalic, neck supple. CARDIOVASCULAR: Normal rate, regular rhythm and normal heart sounds. No murmur heard. No BLE edema. PULMONARY: Effort normal and breath sounds normal. No respiratory distress. ABDOMINAL: There is no tenderness or  distention. MUSCULOSKELETAL: Normal gait. Without gross motor or sensory deficit. Pain on the mid part of the left thumb. PSYCHIATRIC: Patient has a normal mood and affect. Behavior is normal. Judgment and thought content normal.  Recent Results (from the past 2160 hours)  HM DIABETES EYE EXAM     Status: None   Collection Time: 05/27/24  9:07 AM  Result Value Ref Range   HM Diabetic Eye Exam No Retinopathy No Retinopathy    Comment: Abstracted by HIM  POCT glycosylated hemoglobin (Hb A1C)     Status: Abnormal   Collection Time: 06/03/24  9:20 AM  Result Value Ref Range   Hemoglobin A1C 6.5 (A) 4.0 - 5.6 %   HbA1c POC (<>  result, manual entry)     HbA1c, POC (prediabetic range)     HbA1c, POC (controlled diabetic range)    Lipid panel     Status: None   Collection Time: 06/03/24  9:58 AM  Result Value Ref Range   Cholesterol 168 <200 mg/dL   HDL 892 > OR = 50 mg/dL   Triglycerides 67 <849 mg/dL   LDL Cholesterol (Calc) 47 mg/dL (calc)    Comment: Reference range: <100 . Desirable range <100 mg/dL for primary prevention;   <70 mg/dL for patients with CHD or diabetic patients  with > or = 2 CHD risk factors. SABRA LDL-C is now calculated using the Martin-Hopkins  calculation, which is a validated novel method providing  better accuracy than the Friedewald equation in the  estimation of LDL-C.  Gladis APPLETHWAITE et al. SANDREA. 7986;689(80): 2061-2068  (http://education.[facility].com/faq/FAQ164)    Total CHOL/HDL Ratio 1.6 <5.0 (calc)   Non-HDL Cholesterol (Calc) 61 <869 mg/dL (calc)    Comment: For patients with diabetes plus 1 major ASCVD risk  factor, treating to a non-HDL-C goal of <100 mg/dL  (LDL-C of <29 mg/dL) is considered a therapeutic  option.   CBC with Differential/Platelet     Status: Abnormal   Collection Time: 06/03/24  9:58 AM  Result Value Ref Range   WBC 6.1 3.8 - 10.8 Thousand/uL   RBC 5.16 (H) 3.80 - 5.10 Million/uL   Hemoglobin 14.5 11.7 - 15.5 g/dL   HCT 54.1 (H) 64.9 - 54.9 %   MCV 88.8 80.0 - 100.0 fL   MCH 28.1 27.0 - 33.0 pg   MCHC 31.7 (L) 32.0 - 36.0 g/dL    Comment: For adults, a slight decrease in the calculated MCHC value (in the range of 30 to 32 g/dL) is most likely not clinically significant; however, it should be interpreted with caution in correlation with other red cell parameters and the patient's clinical condition.    RDW 14.5 11.0 - 15.0 %   Platelets 226 140 - 400 Thousand/uL   MPV 11.1 7.5 - 12.5 fL   Neutro Abs 3,074 1,500 - 7,800 cells/uL   Absolute Lymphocytes 2,403 850 - 3,900 cells/uL   Absolute Monocytes 494 200 - 950 cells/uL   Eosinophils  Absolute 98 15 - 500 cells/uL   Basophils Absolute 31 0 - 200 cells/uL   Neutrophils Relative % 50.4 %   Total Lymphocyte 39.4 %   Monocytes Relative 8.1 %   Eosinophils Relative 1.6 %   Basophils Relative 0.5 %  Comprehensive metabolic panel with GFR     Status: None   Collection Time: 06/03/24  9:58 AM  Result Value Ref Range   Glucose, Bld 91 65 - 99 mg/dL    Comment: .  Fasting reference interval .    BUN 16 7 - 25 mg/dL   Creat 9.05 9.49 - 8.94 mg/dL   eGFR 69 > OR = 60 fO/fpw/8.26f7   BUN/Creatinine Ratio SEE NOTE: 6 - 22 (calc)    Comment:    Not Reported: BUN and Creatinine are within    reference range. .    Sodium 140 135 - 146 mmol/L   Potassium 4.1 3.5 - 5.3 mmol/L   Chloride 103 98 - 110 mmol/L   CO2 26 20 - 32 mmol/L   Calcium  9.6 8.6 - 10.4 mg/dL   Total Protein 7.2 6.1 - 8.1 g/dL   Albumin 4.5 3.6 - 5.1 g/dL   Globulin 2.7 1.9 - 3.7 g/dL (calc)   AG Ratio 1.7 1.0 - 2.5 (calc)   Total Bilirubin 0.6 0.2 - 1.2 mg/dL   Alkaline phosphatase (APISO) 74 37 - 153 U/L   AST 15 10 - 35 U/L   ALT 14 6 - 29 U/L  TSH     Status: None   Collection Time: 06/03/24  9:58 AM  Result Value Ref Range   TSH 0.84 0.40 - 4.50 mIU/L  VITAMIN D  25 Hydroxy (Vit-D Deficiency, Fractures)     Status: None   Collection Time: 06/03/24  9:58 AM  Result Value Ref Range   Vit D, 25-Hydroxy 76 30 - 100 ng/mL    Comment: Vitamin D  Status         25-OH Vitamin D : . Deficiency:                    <20 ng/mL Insufficiency:             20 - 29 ng/mL Optimal:                 > or = 30 ng/mL . For 25-OH Vitamin D  testing on patients on  D2-supplementation and patients for whom quantitation  of D2 and D3 fractions is required, the [facility](TM) 25-OH VIT D, (D2,D3), LC/MS/MS is recommended: order  code 07111 (patients >53yrs). . See Note 1 . Note 1 . For additional information, please refer to  http://education.[facility].com/faq/FAQ199  (This link is being  provided for informational/ educational purposes only.)   PTH, intact and calcium      Status: None   Collection Time: 06/03/24  9:58 AM  Result Value Ref Range   PTH 26 16 - 77 pg/mL    Comment: . Interpretive Guide    Intact PTH           Calcium  ------------------    ----------           ------- Normal Parathyroid    Normal               Normal Hypoparathyroidism    Low or Low Normal    Low Hyperparathyroidism    Primary            Normal or High       High    Secondary          High                 Normal or Low    Tertiary           High                 High Non-Parathyroid    Hypercalcemia      Low or Low Normal    High .  Calcium  9.6 8.6 - 10.4 mg/dL  Microalbumin / creatinine urine ratio     Status: None   Collection Time: 06/03/24 10:14 AM  Result Value Ref Range   Creatinine, Urine 75 20 - 275 mg/dL   Microalb, Ur 0.6 mg/dL    Comment: Reference Range Not established    Microalb Creat Ratio 8 <30 mg/g creat    Comment: . The ADA defines abnormalities in albumin excretion as follows: SABRA Albuminuria Category        Result (mg/g creatinine) . Normal to Mildly increased   <30 Moderately increased         30-299  Severely increased           > OR = 300 . The ADA recommends that at least two of three specimens collected within a 3-6 month period be abnormal before considering a patient to be within a diagnostic category.       Assessment & Plan Trigger thumb, left thumb Trigger thumb causing flexion lock and pain despite brace use. - Administer corticosteroid injection with lidocaine  to the left thumb. - Discussed potential side effects: pain during administration, temporary numbness, infection risk. - Advised referral to orthopedics if symptoms return within two weeks for further evaluation and possible surgery.  Consent form signed Localized tendon sheet on left thumb Injection with lidocaine  1% and Depomedrol 40mg /1 ml parallel to tendon sheath on both  sides Patient tolerated procedure well No side effects   DM with microalbuminuria - risk of hyperglycemia due to steroid , drink fluids, last A1C at goal

## 2024-08-26 ENCOUNTER — Ambulatory Visit: Payer: Self-pay | Admitting: Family Medicine

## 2024-08-26 ENCOUNTER — Ambulatory Visit
Admission: RE | Admit: 2024-08-26 | Discharge: 2024-08-26 | Disposition: A | Discharge status: Home / Self Care | Source: Ambulatory Visit | Attending: Family Medicine | Admitting: Family Medicine

## 2024-08-26 DIAGNOSIS — Z13820 Encounter for screening for osteoporosis: Secondary | ICD-10-CM | POA: Insufficient documentation

## 2024-08-26 DIAGNOSIS — Z1231 Encounter for screening mammogram for malignant neoplasm of breast: Secondary | ICD-10-CM

## 2024-08-26 DIAGNOSIS — E2839 Other primary ovarian failure: Secondary | ICD-10-CM | POA: Insufficient documentation

## 2024-10-14 ENCOUNTER — Encounter: Payer: Self-pay | Admitting: Family Medicine

## 2024-10-14 ENCOUNTER — Ambulatory Visit: Admitting: Family Medicine

## 2024-10-14 VITALS — BP 116/70 | HR 82 | Resp 16 | Ht 67.0 in | Wt 210.3 lb

## 2024-10-14 DIAGNOSIS — E669 Obesity, unspecified: Secondary | ICD-10-CM | POA: Diagnosis not present

## 2024-10-14 DIAGNOSIS — E1169 Type 2 diabetes mellitus with other specified complication: Secondary | ICD-10-CM | POA: Diagnosis not present

## 2024-10-14 DIAGNOSIS — E1129 Type 2 diabetes mellitus with other diabetic kidney complication: Secondary | ICD-10-CM

## 2024-10-14 DIAGNOSIS — Z7984 Long term (current) use of oral hypoglycemic drugs: Secondary | ICD-10-CM

## 2024-10-14 DIAGNOSIS — E890 Postprocedural hypothyroidism: Secondary | ICD-10-CM

## 2024-10-14 DIAGNOSIS — I10 Essential (primary) hypertension: Secondary | ICD-10-CM

## 2024-10-14 DIAGNOSIS — N1831 Chronic kidney disease, stage 3a: Secondary | ICD-10-CM | POA: Diagnosis not present

## 2024-10-14 DIAGNOSIS — S68110S Complete traumatic metacarpophalangeal amputation of right index finger, sequela: Secondary | ICD-10-CM

## 2024-10-14 DIAGNOSIS — R634 Abnormal weight loss: Secondary | ICD-10-CM

## 2024-10-14 DIAGNOSIS — R110 Nausea: Secondary | ICD-10-CM

## 2024-10-14 DIAGNOSIS — Z23 Encounter for immunization: Secondary | ICD-10-CM

## 2024-10-14 DIAGNOSIS — S68119S Complete traumatic metacarpophalangeal amputation of unspecified finger, sequela: Secondary | ICD-10-CM

## 2024-10-14 DIAGNOSIS — E785 Hyperlipidemia, unspecified: Secondary | ICD-10-CM

## 2024-10-14 DIAGNOSIS — E119 Type 2 diabetes mellitus without complications: Secondary | ICD-10-CM

## 2024-10-14 LAB — POCT GLYCOSYLATED HEMOGLOBIN (HGB A1C): Hemoglobin A1C: 6.5 % — AB (ref 4.0–5.6)

## 2024-10-14 MED ORDER — ATORVASTATIN CALCIUM 40 MG PO TABS: 40.0000 mg | ORAL_TABLET | Freq: Every day | ORAL | 1 refills | Status: AC

## 2024-10-14 MED ORDER — DAPAGLIFLOZIN PRO-METFORMIN ER 5-1000 MG PO TB24: 2.0000 | ORAL_TABLET | Freq: Every day | ORAL | 0 refills | Status: DC

## 2024-10-14 MED ORDER — LOSARTAN POTASSIUM-HCTZ 50-12.5 MG PO TABS: 1.0000 | ORAL_TABLET | Freq: Every day | ORAL | 0 refills | Status: DC

## 2024-10-14 NOTE — Progress Notes (Signed)
 Name: Samantha Copeland   MRN: [id number]    DOB: Jan 07, 1961   Date:10/14/2024       Progress Note  Subjective  Chief Complaint  Chief Complaint  Patient presents with   Medical Management of Chronic Issues   Discussed the use of AI scribe software for clinical note transcription with the patient, who gave verbal consent to proceed.  History of Present Illness Samantha Copeland is a 63 year old female with type 2 diabetes and hypertension who presents with unexplained weight loss and nausea.  She has experienced a significant weight loss of 19 pounds over the past year without any changes in her medication regimen. She attributes this weight loss to a decreased appetite and smaller portion sizes, stating 'I just don't eat a lot of food no more.'  She experiences nausea with certain foods, such as turkey hamburger, and describes a sensation of fullness and occasional nausea when eating, but denies any vomiting or abdominal pain. No changes in bowel movements or difficulty swallowing.  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  Her past medical history includes type 2 diabetes, hypertension, dyslipidemia, hypothyroidism, and chronic kidney disease stage 3A. She takes Xigduo  (dapagliflozin  and metformin ) 04/999 mg twice daily for diabetes, losartan  HCTZ 50/12.5 mg for hypertension, atorvastatin  40 mg for dyslipidemia, and levothyroxine  100 mcg daily for hypothyroidism. She also takes vitamin D  twice a month and a baby aspirin daily. Her diabetes is under control with an A1c of 6.5%. She previously took Trulicity  but discontinued it over two years ago due to cost.  She has undergone a colonoscopy in 2023 and a mammogram in 2025, both of which were normal. She also had a bone density test in September, which was normal. She has a history of a trigger thumb, which has resolved, and an amputated  right index finger from an accident, which she manages well.    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 - NEGATIVE   BREAST EXCISIONAL BIOPSY Right    benign   COLONOSCOPY WITH PROPOFOL  N/A 07/11/2022   Procedure: COLONOSCOPY WITH PROPOFOL ;  Surgeon: Therisa Bi, MD;  Location[facility] ENDOSCOPY;  Service: Gastroenterology;  Laterality: N/A;   DORSAL COMPARTMENT RELEASE Left 01/18/2017   Procedure: RELEASE DORSAL COMPARTMENT (DEQUERVAIN);  Surgeon: Lynwood SHAUNNA Hue, MD;  Location: [facility] ORS;  Service: Orthopedics;  Laterality: Left;   FINE NEEDLE ASPIRATION  10/01/2013   Thyroid-Dr. Damian   THYROIDECTOMY N/A 12/09/2015   Procedure: THYROIDECTOMY;  Surgeon: Deward Dolly,  MD;  Location: [facility] ORS;  Service: ENT;  Laterality: N/A;    Family History  Problem Relation Age of Onset   Diabetes Mother    Diabetes Father    Hypertension Father    Hyperlipidemia Father    Cancer Father    Diabetes Sister    Breast cancer Neg Hx     Social History    Tobacco Use   Smoking status: Never   Smokeless tobacco: Never  Substance Use Topics   Alcohol use: Yes    Comment: occasionally     Current Outpatient Medications:    aspirin EC 81 MG tablet, Take 81 mg by mouth every evening. , Disp: , Rfl:    atorvastatin  (LIPITOR) 40 MG tablet, Take 1 tablet (40 mg total) by mouth daily., Disp: 90 tablet, Rfl: 1   Cholecalciferol (D3 5000) 125 MCG (5000 UT) capsule, Take 5,000 Units by mouth daily., Disp: , Rfl:    Dapagliflozin  Pro-metFORMIN  ER (XIGDUO  XR) 04-999 MG TB24, Take 2 tablets by mouth daily., Disp: 180 tablet, Rfl: 0   levothyroxine  (SYNTHROID ) 100 MCG tablet, TAKE 1 TABLET BY MOUTH ONCE DAILY EXCEPT  SUNDAYS  -  TAKE  1/2  TAB  ONLY  ON  SUNDAYS, Disp: 94 tablet, Rfl: 1   losartan -hydrochlorothiazide  (HYZAAR) 50-12.5 MG tablet, Take 1 tablet by mouth daily., Disp: 90 tablet, Rfl: 0  No Known Allergies  I personally reviewed active problem list, medication list, allergies with the patient/caregiver today.   ROS  Ten systems reviewed and is negative except as mentioned in HPI    Objective Physical Exam  CONSTITUTIONAL: Patient appears well-developed and well-nourished. No distress. HEENT: Head atraumatic, normocephalic, neck supple. CARDIOVASCULAR: Normal rate, regular rhythm and normal heart sounds. No murmur heard. No BLE edema. PULMONARY: Effort normal and breath sounds normal. No respiratory distress. ABDOMINAL: Abdomen non-tender, no changes in bowel movements. No distention. MUSCULOSKELETAL: Normal gait.  Partial amputated right index finger PSYCHIATRIC: Patient has a normal mood and affect. Behavior is normal. Judgment and thought content normal.  Vitals:   10/14/24 1102  BP: 116/70  Pulse: 82  Resp: 16  SpO2: 99%  Weight: 210 lb 4.8 oz (95.4 kg)  Height: 5' 7 (1.702 m)    Body mass index is 32.94 kg/m.  Recent Results (from the past 2160 hours)  POCT glycosylated hemoglobin (Hb A1C)     Status: Abnormal    Collection Time: 10/14/24 11:15 AM  Result Value Ref Range   Hemoglobin A1C 6.5 (A) 4.0 - 5.6 %   HbA1c POC (<> result, manual entry)     HbA1c, POC (prediabetic range)     HbA1c, POC (controlled diabetic range)        PHQ2/9:    10/14/2024   10:55 AM 08/20/2024   10:53 AM 06/27/2024    7:58 AM 06/03/2024    9:08 AM 01/29/2024    7:56 AM  Depression screen PHQ 2/9  Decreased Interest 0 0 0 0 0  Down, Depressed, Hopeless 0 0 0 0 0  PHQ - 2 Score 0 0 0 0 0  Altered sleeping     0  Tired, decreased energy     0  Change in appetite     0  Feeling bad or failure about yourself      0  Trouble concentrating     0  Moving slowly or fidgety/restless     0  Suicidal thoughts     0  PHQ-9 Score  0  Difficult doing work/chores     Not difficult at all    phq 9 is negative  Fall Risk:    10/14/2024   10:55 AM 08/20/2024   10:53 AM 06/27/2024    7:58 AM 06/03/2024    9:08 AM 01/29/2024    7:56 AM  Fall Risk   Falls in the past year? 0 0 0 0 0  Number falls in past yr: 0 0 0 0 0  Injury with Fall? 0 0 0 0 0  Risk for fall due to : No Fall Risks No Fall Risks No Fall Risks No Fall Risks No Fall Risks  Follow up Falls evaluation completed Falls evaluation completed Falls evaluation completed Falls prevention discussed;Education provided;Falls evaluation completed Falls prevention discussed;Education provided;Falls evaluation completed      Assessment & Plan Abnormal weight loss and nausea Significant weight loss of 19 pounds over the past year with early satiety and nausea. Differential includes gastrointestinal issues. Previous colonoscopy normal, mammogram up to date. - Referred to gastroenterologist for evaluation and possible endoscopy. - Advised to maintain a food journal to track foods causing nausea. - Consider CT chest , abdomen and pelvis if endoscopy is normal. - Consider ultrasound of the liver if indicated by GI specialist.  Type 2 diabetes mellitus with,  microalbuminuria,  CKI stage 3a, HTN and dyslipidemia plus obesity Diabetes well-controlled with A1c of 6.5%. Managed with Xigduo  and losartan . Weight loss may contribute to improved glycemic control. - Continue Xigduo  and losartan /hydrochlorothiazide  - Continue statin therapy . - Monitor A1c levels and consider adjusting metformin  dose if A1c remains well-controlled.  Chronic kidney disease, stage 3a Stage 3a CKD with improved kidney function. Managed with losartan  and SGLT2 component of Xigduo . - Continue losartan  and Xigduo .  Postprocedural hypothyroidism Post-thyroidectomy hypothyroidism managed with levothyroxine . - Continue levothyroxine  100 mcg daily.  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 - Continue monitoring function of the right index finger.

## 2024-10-16 ENCOUNTER — Other Ambulatory Visit: Payer: Self-pay

## 2024-10-16 DIAGNOSIS — E890 Postprocedural hypothyroidism: Secondary | ICD-10-CM

## 2024-10-16 MED ORDER — LEVOTHYROXINE SODIUM 100 MCG PO TABS: ORAL_TABLET | ORAL | 1 refills | Status: AC

## 2025-01-14 ENCOUNTER — Ambulatory Visit: Admitting: Family Medicine

## 2025-01-14 ENCOUNTER — Encounter: Payer: Self-pay | Admitting: Family Medicine

## 2025-01-14 VITALS — BP 116/74 | HR 82 | Resp 16 | Ht 67.0 in | Wt 209.8 lb

## 2025-01-14 DIAGNOSIS — E785 Hyperlipidemia, unspecified: Secondary | ICD-10-CM

## 2025-01-14 DIAGNOSIS — S68119S Complete traumatic metacarpophalangeal amputation of unspecified finger, sequela: Secondary | ICD-10-CM

## 2025-01-14 DIAGNOSIS — Z7984 Long term (current) use of oral hypoglycemic drugs: Secondary | ICD-10-CM

## 2025-01-14 DIAGNOSIS — I129 Hypertensive chronic kidney disease with stage 1 through stage 4 chronic kidney disease, or unspecified chronic kidney disease: Secondary | ICD-10-CM

## 2025-01-14 DIAGNOSIS — E1122 Type 2 diabetes mellitus with diabetic chronic kidney disease: Secondary | ICD-10-CM

## 2025-01-14 DIAGNOSIS — E669 Obesity, unspecified: Secondary | ICD-10-CM

## 2025-01-14 DIAGNOSIS — E1169 Type 2 diabetes mellitus with other specified complication: Secondary | ICD-10-CM

## 2025-01-14 DIAGNOSIS — E890 Postprocedural hypothyroidism: Secondary | ICD-10-CM

## 2025-01-14 DIAGNOSIS — N1831 Chronic kidney disease, stage 3a: Secondary | ICD-10-CM

## 2025-01-14 DIAGNOSIS — E119 Type 2 diabetes mellitus without complications: Secondary | ICD-10-CM

## 2025-01-14 LAB — POCT GLYCOSYLATED HEMOGLOBIN (HGB A1C): Hemoglobin A1C: 6.1 % — AB (ref 4.0–5.6)

## 2025-01-14 MED ORDER — DAPAGLIFLOZIN PRO-METFORMIN ER 5-1000 MG PO TB24: 2.0000 | ORAL_TABLET | Freq: Every day | ORAL | 1 refills | Status: AC

## 2025-01-14 MED ORDER — LOSARTAN POTASSIUM-HCTZ 50-12.5 MG PO TABS: 1.0000 | ORAL_TABLET | Freq: Every day | ORAL | 1 refills | Status: AC

## 2025-01-23 ENCOUNTER — Ambulatory Visit: Admission: RE | Admit: 2025-01-23 | Source: Home / Self Care | Admitting: Gastroenterology

## 2025-01-23 ENCOUNTER — Encounter: Admission: RE | Payer: Self-pay | Source: Home / Self Care

## 2025-04-21 ENCOUNTER — Ambulatory Visit: Admitting: Family Medicine

## 2025-07-17 ENCOUNTER — Encounter: Admitting: Family Medicine
